# Patient Record
Sex: Female | Born: 1940 | Race: White | Hispanic: No | Smoking: Former smoker
Health system: Southern US, Community
[De-identification: ages and names within clinical notes are randomized; demographics above are authoritative.]

## PROBLEM LIST (undated history)

## (undated) DIAGNOSIS — Z9889 Other specified postprocedural states: Secondary | ICD-10-CM

## (undated) DIAGNOSIS — I10 Essential (primary) hypertension: Secondary | ICD-10-CM

## (undated) DIAGNOSIS — M810 Age-related osteoporosis without current pathological fracture: Secondary | ICD-10-CM

## (undated) DIAGNOSIS — S42009A Fracture of unspecified part of unspecified clavicle, initial encounter for closed fracture: Secondary | ICD-10-CM

## (undated) DIAGNOSIS — E785 Hyperlipidemia, unspecified: Secondary | ICD-10-CM

## (undated) DIAGNOSIS — R112 Nausea with vomiting, unspecified: Secondary | ICD-10-CM

## (undated) DIAGNOSIS — F32A Depression, unspecified: Secondary | ICD-10-CM

## (undated) DIAGNOSIS — K635 Polyp of colon: Secondary | ICD-10-CM

## (undated) DIAGNOSIS — E039 Hypothyroidism, unspecified: Secondary | ICD-10-CM

## (undated) DIAGNOSIS — F419 Anxiety disorder, unspecified: Secondary | ICD-10-CM

## (undated) DIAGNOSIS — E162 Hypoglycemia, unspecified: Secondary | ICD-10-CM

## (undated) DIAGNOSIS — Z8619 Personal history of other infectious and parasitic diseases: Secondary | ICD-10-CM

## (undated) HISTORY — DX: Age-related osteoporosis without current pathological fracture: M81.0

## (undated) HISTORY — DX: Polyp of colon: K63.5

## (undated) HISTORY — DX: Personal history of other infectious and parasitic diseases: Z86.19

## (undated) HISTORY — DX: Fracture of unspecified part of unspecified clavicle, initial encounter for closed fracture: S42.009A

## (undated) HISTORY — DX: Hypoglycemia, unspecified: E16.2

## (undated) HISTORY — DX: Hyperlipidemia, unspecified: E78.5

## (undated) HISTORY — PX: CLOSED REDUCTION FACIAL FRACTURE: SHX1359

---

## 1986-02-27 HISTORY — PX: ABDOMINAL HYSTERECTOMY: SHX81

## 1999-05-03 ENCOUNTER — Encounter: Payer: Self-pay | Admitting: Internal Medicine

## 1999-05-03 ENCOUNTER — Encounter: Admission: RE | Admit: 1999-05-03 | Discharge: 1999-05-03 | Payer: Self-pay | Admitting: Internal Medicine

## 1999-06-23 ENCOUNTER — Ambulatory Visit (HOSPITAL_COMMUNITY): Admission: RE | Admit: 1999-06-23 | Discharge: 1999-06-23 | Payer: Self-pay | Admitting: Gastroenterology

## 2000-06-05 ENCOUNTER — Encounter: Payer: Self-pay | Admitting: Internal Medicine

## 2000-06-05 ENCOUNTER — Encounter: Admission: RE | Admit: 2000-06-05 | Discharge: 2000-06-05 | Payer: Self-pay | Admitting: Internal Medicine

## 2001-06-26 ENCOUNTER — Encounter: Admission: RE | Admit: 2001-06-26 | Discharge: 2001-06-26 | Payer: Self-pay | Admitting: Internal Medicine

## 2001-06-26 ENCOUNTER — Encounter: Payer: Self-pay | Admitting: Internal Medicine

## 2002-02-27 HISTORY — PX: BREAST BIOPSY: SHX20

## 2002-07-09 ENCOUNTER — Encounter: Payer: Self-pay | Admitting: Internal Medicine

## 2002-07-09 ENCOUNTER — Encounter: Admission: RE | Admit: 2002-07-09 | Discharge: 2002-07-09 | Payer: Self-pay | Admitting: Internal Medicine

## 2002-07-14 ENCOUNTER — Encounter: Admission: RE | Admit: 2002-07-14 | Discharge: 2002-07-14 | Payer: Self-pay | Admitting: Internal Medicine

## 2002-07-14 ENCOUNTER — Encounter: Payer: Self-pay | Admitting: Internal Medicine

## 2002-07-14 ENCOUNTER — Encounter (INDEPENDENT_AMBULATORY_CARE_PROVIDER_SITE_OTHER): Payer: Self-pay | Admitting: Specialist

## 2002-12-31 ENCOUNTER — Encounter: Admission: RE | Admit: 2002-12-31 | Discharge: 2002-12-31 | Payer: Self-pay | Admitting: Internal Medicine

## 2003-06-17 ENCOUNTER — Ambulatory Visit (HOSPITAL_COMMUNITY): Admission: RE | Admit: 2003-06-17 | Discharge: 2003-06-18 | Payer: Self-pay | Admitting: Neurosurgery

## 2003-09-07 ENCOUNTER — Ambulatory Visit (HOSPITAL_BASED_OUTPATIENT_CLINIC_OR_DEPARTMENT_OTHER): Admission: RE | Admit: 2003-09-07 | Discharge: 2003-09-07 | Payer: Self-pay | Admitting: Orthopedic Surgery

## 2004-02-02 ENCOUNTER — Encounter: Admission: RE | Admit: 2004-02-02 | Discharge: 2004-02-02 | Payer: Self-pay | Admitting: Internal Medicine

## 2004-05-12 ENCOUNTER — Encounter: Admission: RE | Admit: 2004-05-12 | Discharge: 2004-05-12 | Payer: Self-pay | Admitting: Internal Medicine

## 2005-03-07 ENCOUNTER — Encounter: Admission: RE | Admit: 2005-03-07 | Discharge: 2005-03-07 | Payer: Self-pay | Admitting: Internal Medicine

## 2006-01-31 ENCOUNTER — Encounter (INDEPENDENT_AMBULATORY_CARE_PROVIDER_SITE_OTHER): Payer: Self-pay | Admitting: Specialist

## 2006-01-31 ENCOUNTER — Ambulatory Visit (HOSPITAL_COMMUNITY): Admission: RE | Admit: 2006-01-31 | Discharge: 2006-01-31 | Payer: Self-pay | Admitting: Gastroenterology

## 2006-03-08 ENCOUNTER — Encounter: Admission: RE | Admit: 2006-03-08 | Discharge: 2006-03-08 | Payer: Self-pay | Admitting: Internal Medicine

## 2007-03-10 DIAGNOSIS — Z803 Family history of malignant neoplasm of breast: Secondary | ICD-10-CM | POA: Insufficient documentation

## 2007-03-10 DIAGNOSIS — F341 Dysthymic disorder: Secondary | ICD-10-CM | POA: Insufficient documentation

## 2007-03-10 DIAGNOSIS — G4700 Insomnia, unspecified: Secondary | ICD-10-CM | POA: Insufficient documentation

## 2007-05-07 ENCOUNTER — Encounter: Admission: RE | Admit: 2007-05-07 | Discharge: 2007-05-07 | Payer: Self-pay | Admitting: Family Medicine

## 2007-05-07 DIAGNOSIS — L219 Seborrheic dermatitis, unspecified: Secondary | ICD-10-CM | POA: Insufficient documentation

## 2007-08-21 ENCOUNTER — Ambulatory Visit: Payer: Self-pay | Admitting: Family Medicine

## 2008-01-03 DIAGNOSIS — L578 Other skin changes due to chronic exposure to nonionizing radiation: Secondary | ICD-10-CM | POA: Insufficient documentation

## 2008-05-25 ENCOUNTER — Encounter: Admission: RE | Admit: 2008-05-25 | Discharge: 2008-05-25 | Payer: Self-pay | Admitting: Family Medicine

## 2008-05-29 ENCOUNTER — Encounter: Admission: RE | Admit: 2008-05-29 | Discharge: 2008-05-29 | Payer: Self-pay | Admitting: Family Medicine

## 2008-07-02 DIAGNOSIS — K5909 Other constipation: Secondary | ICD-10-CM | POA: Insufficient documentation

## 2008-07-02 DIAGNOSIS — K5900 Constipation, unspecified: Secondary | ICD-10-CM | POA: Insufficient documentation

## 2009-01-01 DIAGNOSIS — H35019 Changes in retinal vascular appearance, unspecified eye: Secondary | ICD-10-CM | POA: Insufficient documentation

## 2009-06-01 ENCOUNTER — Encounter: Admission: RE | Admit: 2009-06-01 | Discharge: 2009-06-01 | Payer: Self-pay

## 2009-09-22 DIAGNOSIS — M542 Cervicalgia: Secondary | ICD-10-CM | POA: Insufficient documentation

## 2010-03-20 ENCOUNTER — Encounter: Payer: Self-pay | Admitting: Family Medicine

## 2010-05-03 ENCOUNTER — Other Ambulatory Visit: Payer: Self-pay | Admitting: Family Medicine

## 2010-05-03 DIAGNOSIS — Z1231 Encounter for screening mammogram for malignant neoplasm of breast: Secondary | ICD-10-CM

## 2010-05-06 IMAGING — MG MM SCREEN MAMMOGRAM BILATERAL
4 series · 4 of 4 positions shown · non-contrast
Comparison: none

DG SCREEN MAMMOGRAM BILATERAL
Bilateral CC and MLO view(s) were taken.

DIGITAL SCREENING MAMMOGRAM WITH CAD:
There are scattered fibroglandular densities.  A possible mass is noted in the right breast.  Spot 
compression views and possibly sonography are recommended for further evaluation.  In the left 
breast, no masses or malignant type calcifications are identified.  Compared with prior studies.

[R CC]
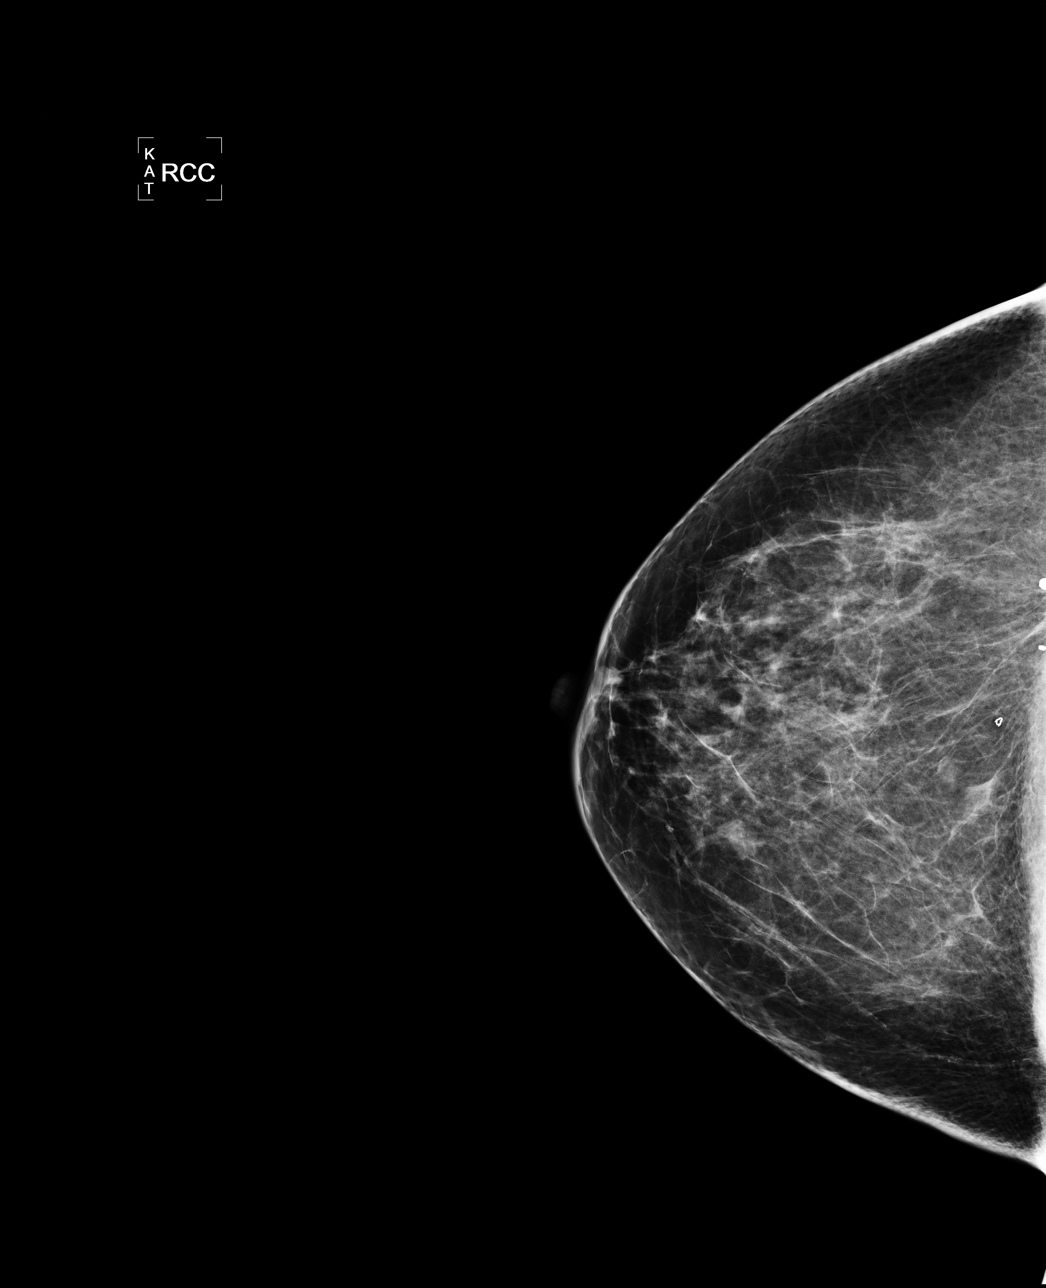

[L CC]
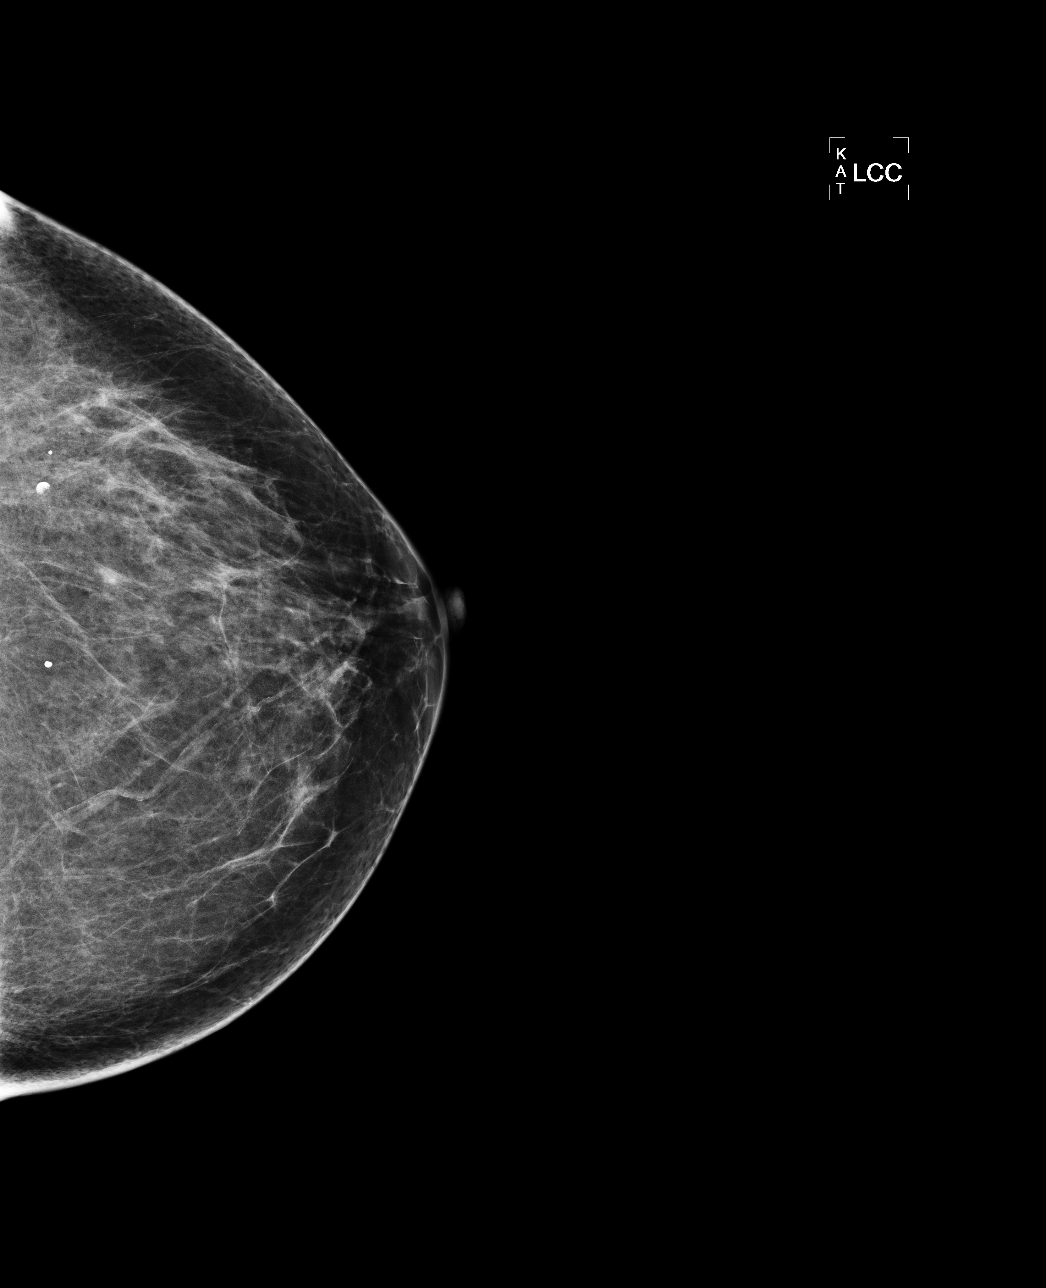

[L MLO]
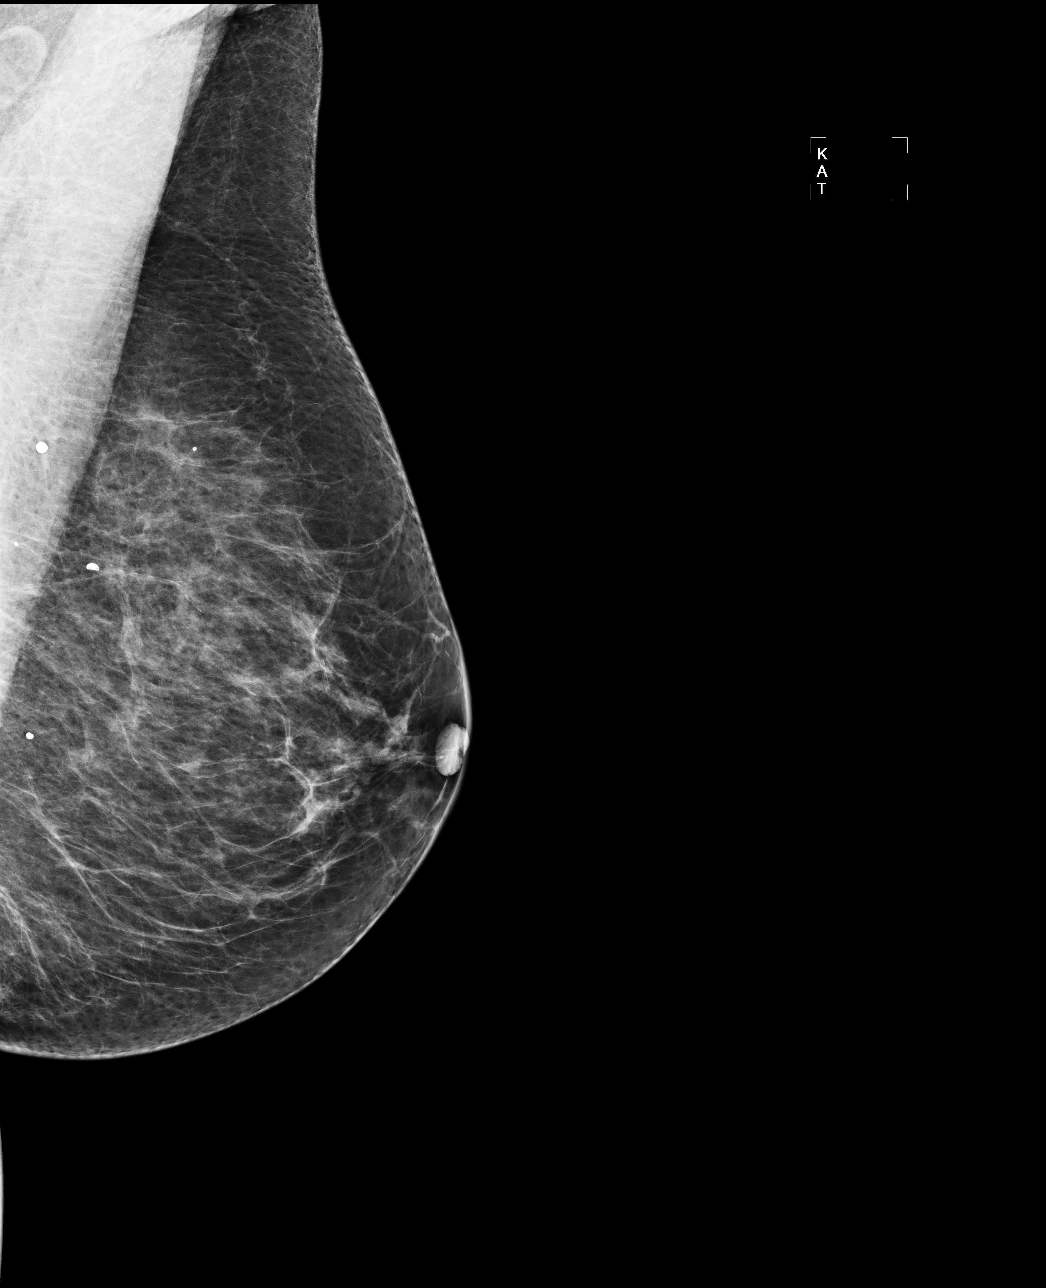

[R MLO]
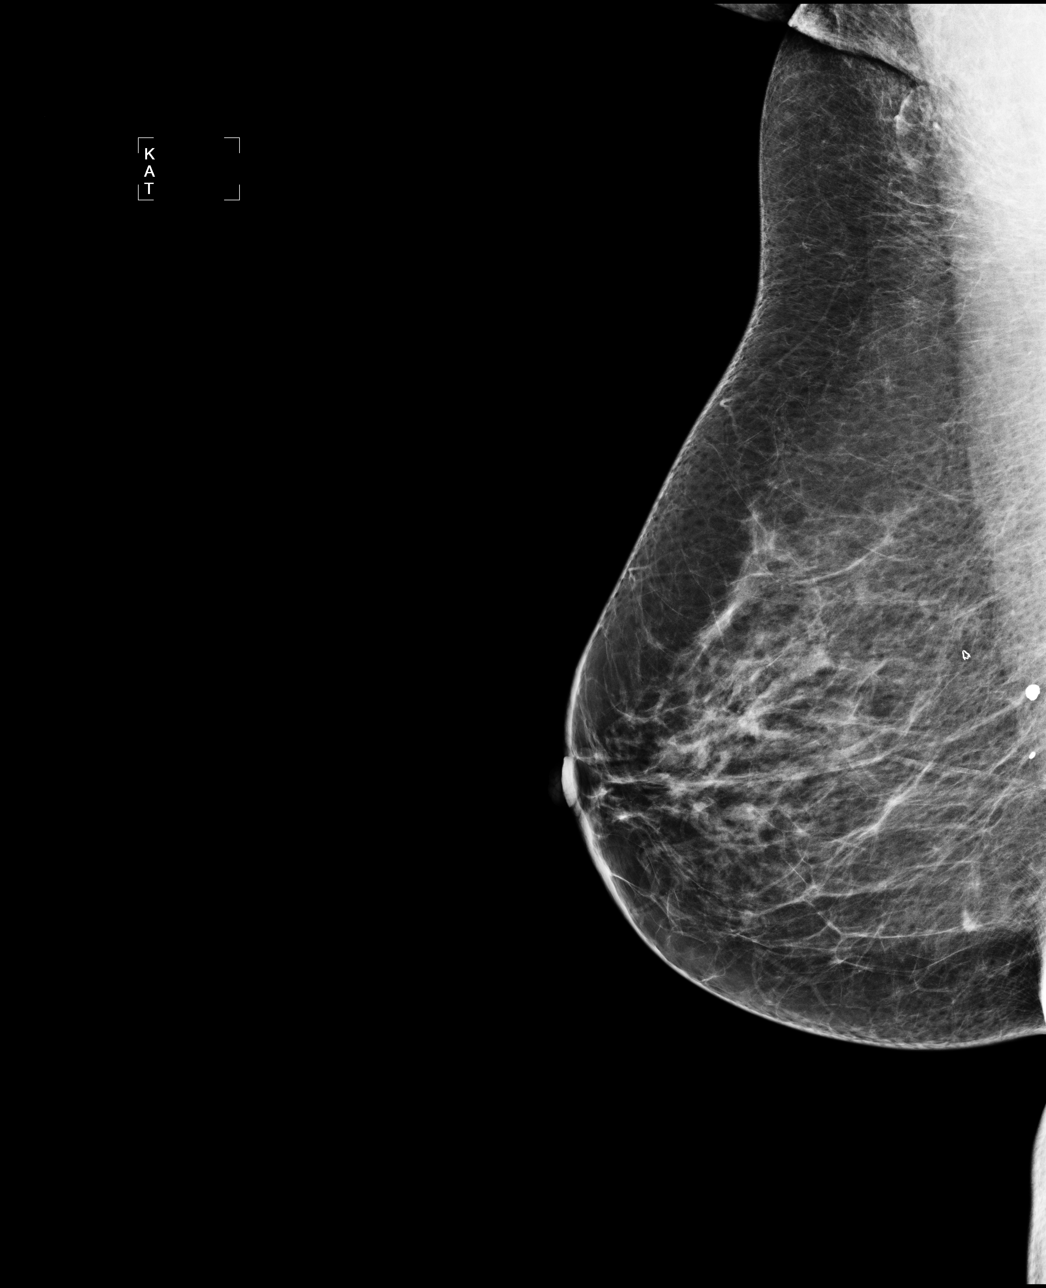

[4 of 4 positions shown; findings below may reference images not displayed]

IMPRESSION: Possible mass, right breast.  Additional evaluation is indicated.  The patient will be contacted 
for additional studies and a supplementary report will follow.  No specific mammographic evidence 
of malignancy, left breast.

ASSESSMENT: Need additional imaging evaluation and/or prior mammograms for comparison - BI-RADS 0

Further imaging of the right breast.
ANALYZED BY COMPUTER AIDED DETECTION. , THIS PROCEDURE WAS A DIGITAL MAMMOGRAM.

## 2010-06-07 ENCOUNTER — Ambulatory Visit
Admission: RE | Admit: 2010-06-07 | Discharge: 2010-06-07 | Disposition: A | Discharge status: Home / Self Care | Payer: Medicare Other | Source: Ambulatory Visit | Attending: Family Medicine | Admitting: Family Medicine

## 2010-06-07 DIAGNOSIS — Z1231 Encounter for screening mammogram for malignant neoplasm of breast: Secondary | ICD-10-CM

## 2010-07-15 NOTE — Op Note (Signed)
Laura Love, SWISS               ACCOUNT NO.:  [id number]   MEDICAL RECORD NO.:  [id number]          PATIENT TYPE:  AMB   LOCATION:  ENDO                         FACILITY:  [facility]   PHYSICIAN:  Anselmo Rod, M.D.  DATE OF BIRTH:  [birth year]   DATE OF PROCEDURE:  01/31/2006  DATE OF DISCHARGE:                               OPERATIVE REPORT   PROCEDURE PERFORMED:  Colonoscopy with multiple cold biopsies.   ENDOSCOPIST:  Anselmo Rod, M.D.   INSTRUMENT USED:  Olympus video colonoscope.   INDICATIONS FOR PROCEDURE:  A 70 year old white female undergoing a  screening colonoscopy to rule out colonic polyps, masses, etc.   PREPROCEDURE PREPARATION:  Informed consent was procured from the  patient.  The patient fasted for 8 hours prior to the procedure and  prepped with a gallon of Trilyte the night prior to the procedure.  The  risks and benefits of the procedure including a 10% miss rate of cancer  and polyp were discussed with the patient as well.   PREPROCEDURE PHYSICAL:  The patient had stable vital signs.  Neck  supple.  Chest clear to auscultation.  S1 and S2 regular.  Abdomen soft  with normal bowel sounds.   DESCRIPTION OF PROCEDURE:  The patient was placed in the left lateral  decubitus position and sedated with 75 mcg of fentanyl and 7.5 mg of  Versed given intravenously in slow incremental doses.  Once the patient  was adequately sedated and maintained on low-flow oxygen and continuous  cardiac monitoring the Olympus video colonoscope was advanced from the  rectum to cecum.  There was a significant amount of residual stool in  the colon.  Multiple washes were done.  A prominent fold was biopsied  from the rectum.  Small internal hemorrhoids were seen.  There was  evidence of sigmoid diverticulosis.  A small sessile polyp was biopsied  from the rectosigmoid colon x3 (cold biopsies x3).  This was a flat  polyp.  Two polyps were biopsied from the left colon.  The  transverse  colon, right colon and cecum appeared normal and the terminal ileum  showed no evidence of disease.  The patient tolerated the procedure well  without immediate complications.   IMPRESSION:  1. Small internal hemorrhoids.  2. Prominent fold biopsied from the rectum.  3. Sigmoid diverticulosis.  4. One flat polyp biopsied from the rectosigmoid colon, two from the      left colon.  5. Normal transverse colon, right colon, and cecum.  6. Normal terminal ileum.   RECOMMENDATIONS:  1. Await pathology results.  2. Avoid all nonsteroidals including aspirin for now especially over      the next 2 weeks.  3. Repeat colonoscopy depending on pathology results.  4. High fiber diet with liberal fluid intake has been advocated and      brochures on diverticulosis has been given to the patient for      education.      Anselmo Rod, M.D.  Electronically Signed     JNM/MEDQ  D:  01/31/2006  T:  01/31/2006  Job:  [id number] 

## 2010-07-15 NOTE — Op Note (Signed)
NAME:  Laura Love, Laura Love                         ACCOUNT NO.:  [id number]   MEDICAL RECORD NO.:  [id number]                   PATIENT TYPE:  AMB   LOCATION:  DSC                                  FACILITY:  [facility]   PHYSICIAN:  John L. Rendall III, M.D.           DATE OF BIRTH:  10/05/1940   DATE OF PROCEDURE:  09/07/2003  DATE OF DISCHARGE:                                 OPERATIVE REPORT   PREOPERATIVE DIAGNOSIS:  Complex tear of lateral meniscus, right knee.   PROCEDURE:  Lateral meniscectomy and local debridement plus chondroplasty of  medial femoral condyle.   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   SURGEON:  John L. Rendall, M.D.   ANESTHESIA:  MAC.   DESCRIPTION OF PROCEDURE:  Under MAC anesthesia the right knee was prepared  with Betadine and draped as a sterile field with a leg holder.  Standard  arthroscopic portals were made and peeling the hyaline cartilage near the  intra-condylar notch of the medial femoral condyle is found.  Using a curved  Merlin shaver and a rasp, this area was significantly improved with a  chondroplasty.  The medial meniscus is intact. Attention is then turned to  the lateral compartment where a complex tear of the lateral meniscus is  found.  There is bare bone in the lateral tibial plateau and on the adjacent  lateral femur and areas of grade III changes on a goodly portion of the  central weight bearing of the medial femoral condyle and tibia.  Using a  combination of basket forceps and intraarticular shaver, the meniscus is  resected back to stable meniscal rim and the peeling cartilage is trimmed  and smoothed with the shaver. All debris is washed out of the knee with the  shaver and irrigation system.  The remainder of the knee is carefully  examined.  Debris is removed from the recesses and suprapatellar pouch.  Following this, the knee is infiltrated with Marcaine with  morphine with  epinephrine and a sterile compression bandage is applied.  The patient  returned to recovery in good condition.                                               John L. Dorothyann Gibbs, M.D.    Renato Gails  D:  09/07/2003  T:  09/07/2003  Job:  [id number]

## 2010-07-15 NOTE — Op Note (Signed)
NAME:  Laura Love, Laura Love                         ACCOUNT NO.:  [id number]   MEDICAL RECORD NO.:  [id number]                   PATIENT TYPE:  OIB   LOCATION:  2899                                 FACILITY:  [facility]   PHYSICIAN:  Hewitt Shorts, M.D.            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   OPERATION PERFORMED:  Left L4-5 lumbar laminotomy and foraminotomy with  microdissection.   SURGEON:  Hewitt Shorts, M.D.   ASSISTANT:  Cristi Loron, M.D.   ANESTHESIA:  General endotracheal.   INDICATIONS FOR PROCEDURE:  The patient is a 70 year old woman who presented  with a left lumbar radiculopathy and was found to have significant  spondylosis and degenerative disk disease with significant left L4-5 lateral  recess stenosis.  Decision was made to proceed with elective laminotomy and  foraminotomy.   DESCRIPTION OF PROCEDURE:  The patient was brought to the operating room and  placed under general endotracheal anesthesia.  The patient was turned to a  prone position.  The lumbar region was prepped with DuraPrep and draped in  sterile fashion.  The midline incision was infiltrated with local  anesthetic with epinephrine and x-ray was taken, the L4-5 level identified.  Then a midline incision made and carried down to the subcutaneous tissue  with bipolar cautery and electrocautery used to maintain hemostasis.  Dissection was carried down to the lumbar fascia which was incised on the  left side of midline and the paraspinal muscles were dissected from the  spinous process and lamina in subperiosteal  fashion.  Self-retaining  retractor was placed, another x-ray was taken. The L4-5 interlaminar space  was identified and then The operating microscope was draped and brought into  the field to provide additional magnification, illumination and  visualization and the remainder of the decompression was performed using  microdissection and microsurgical technique.  A laminotomy was performed  using the [facility] Max drill and Kerrison punches.  The ligamentum flavum was  carefully removed.  It was noted to be quite thickened and as it was  removed, we were able to decompress the thecal sac and nerve root.  Using an  up angled curet we were able to scrape away some of the more lateral aspect  of the ligamentum flavum from the undersurface of the facet and in the end,  good decompression of the left lateral recess and foramina was achieved.  We  did palpate the disk which was degenerated but not herniated and it was felt  that no further decompression would be achieved by proceeding with the  diskectomy. We  therefore established hemostasis with the use of Gelfoam  soaked in Thrombin.  All the Gelfoam was removed, though, prior to closure.  We instilled 2 mL of fentanyl and 80 mg of Depo-Medrol in the epidural space  and proceeded with closure.  The deep fascia closed with interrupted #1  undyed Vicryl sutures, the subcutaneous and subcuticular layer were closed  with interrupted inverted 2-0 undyed Vicryl sutures, skin edges  were approximated with Dermabond.  The patient tolerated the procedure well.  The estimated blood loss was 25 mL.  Sponge, needle and instrument counts  were correct.  Following surgery, the patient was turned back to supine  position reversed from anesthetic, extubated and transferred to recovery  room for further care.                                               Hewitt Shorts, M.D.    RWN/MEDQ  D:  06/17/2003  T:  06/18/2003  Job:  [id number]

## 2010-07-15 NOTE — Procedures (Signed)
[facility]  Patient:    Laura Love, Laura Love                      MRN: [id number] Proc. Date: 06/23/99 Adm. Date:  [id number] Attending:  Charna Elizabeth CC:         Lyndon Code, M.D.                           Procedure Report  DATE OF BIRTH:  [birth year].  REFERRING PHYSICIAN:  Lyndon Code, M.D.  PROCEDURE PERFORMED:  Colonoscopy.  ENDOSCOPIST:  Anselmo Rod, M.D.  INSTRUMENT USED:  Olympus video colonoscope.  INDICATIONS FOR PROCEDURE:  The patient is a 70 year old white female with a history of colonic polyps and recent rectal bleeding rule out recurrent polyps, masses, hemorrhoids, masses, etc.  PREPROCEDURE PREPARATION:  Informed consent was procured from the patient. The patient was fasted for eight hours prior to the procedure and prepped with a bottle of magnesium citrate and a gallon of NuLytely the night prior to the procedure.  PREPROCEDURE PHYSICAL:  The patient had stable vital signs.  Neck supple. Chest clear to auscultation.  S1, S2 regular.  Abdomen soft with normal abdominal bowel sounds.  DESCRIPTION OF PROCEDURE:  The patient was placed in the left lateral decubitus position and sedated with 50 mg of Demerol and 3 mg of Versed intravenously.  Once the patient was adequately sedated and maintained on low-flow oxygen and continuous cardiac monitoring, the Olympus video colonoscope was advanced from the rectum to the cecum without difficulty. There were a few left-sided diverticula seen.  A small of residual stool in the dependent areas of the colon.  No masses, polyps, erosions or ulcerations were seen.  The procedure was complete up to the cecum.  On withdrawing the scope and retroflexing in the cecum, moderate-sized internal hemorrhoids were seen.  No other abnormalities were present.  The patient tolerated the procedure well without complications.  IMPRESSION: 1. Moderate sized internal hemorrhoids. 2. Left-sided  diverticulosis. 3. No masses or polyps seen.  Procedure completed up to the cecum.  RECOMMENDATIONS: 1. The patient has been advised to follow up in the office in the next two    weeks. 2. A high fiber diet with liberal fluid intake has been advocated.  DD:  06/23/99 TD:  06/23/99 Job: [id number]

## 2010-10-19 ENCOUNTER — Ambulatory Visit: Payer: Self-pay | Admitting: Internal Medicine

## 2010-10-19 ENCOUNTER — Encounter: Payer: Self-pay | Admitting: Internal Medicine

## 2010-10-19 ENCOUNTER — Ambulatory Visit (INDEPENDENT_AMBULATORY_CARE_PROVIDER_SITE_OTHER): Payer: Medicare Other | Admitting: Internal Medicine

## 2010-10-19 DIAGNOSIS — N8110 Cystocele, unspecified: Secondary | ICD-10-CM

## 2010-10-19 DIAGNOSIS — R05 Cough: Secondary | ICD-10-CM

## 2010-10-19 DIAGNOSIS — R32 Unspecified urinary incontinence: Secondary | ICD-10-CM

## 2010-10-19 DIAGNOSIS — E7800 Pure hypercholesterolemia, unspecified: Secondary | ICD-10-CM | POA: Insufficient documentation

## 2010-10-19 DIAGNOSIS — R059 Cough, unspecified: Secondary | ICD-10-CM | POA: Insufficient documentation

## 2010-10-19 DIAGNOSIS — E785 Hyperlipidemia, unspecified: Secondary | ICD-10-CM | POA: Insufficient documentation

## 2010-10-19 DIAGNOSIS — Z0000 Encounter for general adult medical examination without abnormal findings: Secondary | ICD-10-CM

## 2010-10-19 NOTE — Patient Instructions (Signed)
Follow up for physical in 1 month. Labs next week, fasting at least 6hours beforehand.  CXR today.

## 2010-10-19 NOTE — Progress Notes (Signed)
Subjective:    Patient ID: Laura Love, female    DOB: 08/25/1940, 70 y.o.   MRN: [id number]  HPI  Laura Love is a 70 year old female who presents to establish care. She reports a history of hyperlipidemia. She reports that she has not recently had her lipid profile checked. She denies any personal history of heart disease or stroke. She has been taking pravastatin for several years and denies any complications from this medication.  Laura Love also reports a 1-2 month history of cough. She describes the cough as dry and nonproductive. She reports the cough initially occurred when she stayed in a hotel room which she described as moldy or musty. She has not been treated for cough. She denies any fever or chills. She denies weight loss. She denies any history of smoking.  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 She has recently been seen by a gynecologist. She was fitted for a pessary. She reports this pessary has significantly improved her quality of life. She is also using both a topical estrogen preparation and an estrogen patch. She reports she is scheduled for evaluation at [facility] with gynecology for possible surgical intervention to treat her vaginal prolapse.  Outpatient Encounter Prescriptions as of 10/19/2010  Medication Sig Dispense Refill  . ergocalciferol (VITAMIN D2) 50000 UNITS capsule Take 50,000 Units by mouth once a week.        . estradiol (VIVELLE-DOT) 0.1 MG/24HR Place 1 patch onto the skin 2 (two) times a week.        Marland Kitchen buPROPion (WELLBUTRIN SR) 150 MG 12 hr tablet       . cephALEXin (KEFLEX) 250 MG capsule       . ESTRING 2 MG vaginal ring       . fenofibrate 160 MG tablet       . pravastatin (PRAVACHOL) 40 MG tablet          . Review of Systems  Constitutional: Negative for fever, chills, diaphoresis, activity change, appetite change, fatigue and unexpected weight change.  HENT: Negative for hearing loss,  ear pain, nosebleeds, congestion, sore throat, facial swelling, rhinorrhea, sneezing, drooling, mouth sores, trouble swallowing, neck pain, neck stiffness, dental problem, voice change, postnasal drip, sinus pressure, tinnitus and ear discharge.   Eyes: Negative for photophobia, pain, discharge, redness, itching and visual disturbance.  Respiratory: Positive for cough. Negative for apnea, choking, chest tightness, shortness of breath, wheezing and stridor.   Cardiovascular: Negative for chest pain, palpitations and leg swelling.  Gastrointestinal: Negative for nausea, vomiting, abdominal pain, diarrhea, constipation, blood in stool, abdominal distention, anal bleeding and rectal pain.  Genitourinary: Positive for frequency and difficulty urinating (without pessary in place, must apply pressure to pelvic floor). Negative for dysuria, urgency, hematuria, flank pain, decreased urine volume, vaginal bleeding, vaginal discharge, enuresis, vaginal pain, menstrual problem, pelvic pain and dyspareunia.  Musculoskeletal: Negative for myalgias, back pain, joint swelling, arthralgias and gait problem.  Skin: Negative for color change, rash and wound.  Neurological: Negative for dizziness, tremors, seizures, syncope, facial asymmetry, speech difficulty, weakness, light-headedness, numbness and headaches.  Hematological: Negative for adenopathy. Does not bruise/bleed easily.  Psychiatric/Behavioral: Negative for suicidal ideas, hallucinations, sleep disturbance, dysphoric mood and decreased concentration. The patient is not nervous/anxious.    BP 138/76  Pulse 74  Temp(Src) 98.5 F (36.9 C) (Oral)  Resp 16  Ht 5\' 3"  (1.6 m)  Wt 148 lb (67.132 kg)  BMI 26.22 kg/m2  SpO2  95%     Objective:   Physical Exam  Constitutional: She is oriented to person, place, and time. She appears well-developed and well-nourished. No distress.  HENT:  Head: Normocephalic and atraumatic.  Right Ear: External ear normal.    Left Ear: External ear normal.  Nose: Nose normal.  Mouth/Throat: Oropharynx is clear and moist. No oropharyngeal exudate.  Eyes: Conjunctivae are normal. Pupils are equal, round, and reactive to light. Right eye exhibits no discharge. Left eye exhibits no discharge. No scleral icterus.  Neck: Normal range of motion. Neck supple. No tracheal deviation present. No thyromegaly present.  Cardiovascular: Normal rate, regular rhythm, normal heart sounds and intact distal pulses.  Exam reveals no gallop and no friction rub.   No murmur heard. Pulmonary/Chest: Effort normal and breath sounds normal. No respiratory distress. She has no wheezes. She has no rales. She exhibits no tenderness.  Musculoskeletal: Normal range of motion. She exhibits no edema and no tenderness.  Lymphadenopathy:    She has no cervical adenopathy.  Neurological: She is alert and oriented to person, place, and time. No cranial nerve deficit. She exhibits normal muscle tone. Coordination normal.  Skin: Skin is warm and dry. No rash noted. She is not diaphoretic. No erythema. No pallor.  Psychiatric: She has a normal mood and affect. Her behavior is normal. Judgment and thought content normal.          Assessment & Plan:  1. Hyperlipidemia - patient with long-standing history of hyperlipidemia. Will check lipid profile and liver function test with lab work today and will continue pravastatin. She will followup to review lab work in one to 2 weeks.  2. Cough - Patient with 1-2 month history of nonproductive cough.Non-smoker.  Exam is normal today. Suspect allergic rhinitis with postnasal drip. However, given persistence of cough will get chest x-ray. She will followup in one to 2 weeks. May consider adding flonase and/or Singulair at that time.  3. Bladder Incontinence /Vaginal Prolapse - Will request records from gynecologists office. Of note patient is using estrogen cream, ring, and estrogen patch. She does have a very  strong family history of breast cancer. She understands the risk associated with use of estrogen preparation, including potential increased risk of breast cancer. At present, she reports significant improvement in quality of life with use of topical estrogen preparations and benefit outweighs risk. She reports being up-to-date on her mammograms. We will request a record of her recent mammogram.

## 2010-10-24 ENCOUNTER — Other Ambulatory Visit (INDEPENDENT_AMBULATORY_CARE_PROVIDER_SITE_OTHER): Payer: Medicare Other | Admitting: *Deleted

## 2010-10-24 DIAGNOSIS — E785 Hyperlipidemia, unspecified: Secondary | ICD-10-CM

## 2010-10-24 DIAGNOSIS — Z0000 Encounter for general adult medical examination without abnormal findings: Secondary | ICD-10-CM

## 2010-10-24 LAB — BASIC METABOLIC PANEL
CO2: 28 mEq/L (ref 19–32)
Calcium: 9.2 mg/dL (ref 8.4–10.5)
Chloride: 102 mEq/L (ref 96–112)
Creatinine, Ser: 0.9 mg/dL (ref 0.4–1.2)
Glucose, Bld: 89 mg/dL (ref 70–99)
Sodium: 138 mEq/L (ref 135–145)

## 2010-10-24 LAB — CBC WITH DIFFERENTIAL/PLATELET
Basophils Absolute: 0.1 10*3/uL (ref 0.0–0.1)
Eosinophils Absolute: 0.2 10*3/uL (ref 0.0–0.7)
HCT: 39.5 % (ref 36.0–46.0)
Hemoglobin: 13.3 g/dL (ref 12.0–15.0)
MCHC: 33.8 g/dL (ref 30.0–36.0)
MCV: 97.3 fl (ref 78.0–100.0)
Monocytes Absolute: 0.5 10*3/uL (ref 0.1–1.0)
RBC: 4.06 Mil/uL (ref 3.87–5.11)
RDW: 13.1 % (ref 11.5–14.6)
WBC: 4.5 10*3/uL (ref 4.5–10.5)

## 2010-10-24 LAB — HEPATIC FUNCTION PANEL
ALT: 21 U/L (ref 0–35)
Albumin: 4.1 g/dL (ref 3.5–5.2)
Alkaline Phosphatase: 54 U/L (ref 39–117)

## 2010-10-24 LAB — LIPID PANEL
Cholesterol: 229 mg/dL — ABNORMAL HIGH (ref 0–200)
HDL: 63.7 mg/dL (ref >39.00)

## 2010-10-24 LAB — LDL CHOLESTEROL, DIRECT: Direct LDL: 159.3 mg/dL

## 2010-11-08 ENCOUNTER — Encounter: Payer: Self-pay | Admitting: Internal Medicine

## 2010-11-08 ENCOUNTER — Ambulatory Visit (INDEPENDENT_AMBULATORY_CARE_PROVIDER_SITE_OTHER): Payer: Medicare Other | Admitting: Internal Medicine

## 2010-11-08 VITALS — BP 120/78 | HR 72 | Temp 97.7°F | Resp 14 | Ht 63.0 in | Wt 150.0 lb

## 2010-11-08 DIAGNOSIS — Z0000 Encounter for general adult medical examination without abnormal findings: Secondary | ICD-10-CM

## 2010-11-08 DIAGNOSIS — E039 Hypothyroidism, unspecified: Secondary | ICD-10-CM | POA: Insufficient documentation

## 2010-11-08 MED ORDER — LEVOTHYROXINE SODIUM 50 MCG PO TABS: 50.0000 ug | ORAL_TABLET | Freq: Every day | ORAL | Status: DC

## 2010-11-08 MED ORDER — ESTRADIOL 0.1 MG/24HR TD PTTW: 1.0000 | MEDICATED_PATCH | TRANSDERMAL | Status: DC

## 2010-11-08 NOTE — Patient Instructions (Signed)
Start Synthroid. Follow up in 6 weeks for labs and in 8 weeks for visit.

## 2010-11-08 NOTE — Progress Notes (Signed)
Laura Love is a 70 y.o. female who presents for Medicare Annual/Subsequent preventive examination.  Preventive Screening-Counseling & Management  Tobacco History  Smoking status  . Never Smoker   Smokeless tobacco  . Never Used     Problems Prior to Visit 1. Vaginal Prolapse 2. Hyperlipidemia 3. Bladder Incontinence  Current Problems (verified) Patient Active Problem List  Diagnoses  . Hyperlipemia  . Bladder incontinence  . Vaginal prolapse    Medications Prior to Visit Current Outpatient Prescriptions on File Prior to Visit  Medication Sig Dispense Refill  . buPROPion (WELLBUTRIN SR) 150 MG 12 hr tablet       . ergocalciferol (VITAMIN D2) 50000 UNITS capsule Take 50,000 Units by mouth once a week.        . estradiol (VIVELLE-DOT) 0.1 MG/24HR Place 1 patch onto the skin 2 (two) times a week.        Marland Kitchen ESTRING 2 MG vaginal ring       . pravastatin (PRAVACHOL) 40 MG tablet         Current Medications (verified) Current Outpatient Prescriptions  Medication Sig Dispense Refill  . buPROPion (WELLBUTRIN SR) 150 MG 12 hr tablet       . ergocalciferol (VITAMIN D2) 50000 UNITS capsule Take 50,000 Units by mouth once a week.        . estradiol (VIVELLE-DOT) 0.1 MG/24HR Place 1 patch onto the skin 2 (two) times a week.        Marland Kitchen ESTRING 2 MG vaginal ring       . pravastatin (PRAVACHOL) 40 MG tablet       . levothyroxine (SYNTHROID) 50 MCG tablet Take 1 tablet (50 mcg total) by mouth daily.  30 tablet  11     Allergies (verified) Review of patient's allergies indicates no known allergies.   PAST HISTORY  Family History Family History  Problem Relation Age of Onset  . Cancer Mother     breast  . Hyperlipidemia Mother   . Cancer Sister     breast cancer    Social History History  Substance Use Topics  . Smoking status: Never Smoker   . Smokeless tobacco: Never Used  . Alcohol Use: No     Are there smokers in your home (other than you)? No  Risk  Factors Current exercise habits: The patient does not participate in regular exercise at present. Pt limited currently by caring for husband. Dietary issues discussed: Healthy,no fried foods. Limited sweets.   Cardiac risk factors: advanced age (older than 36 for men, 70 for women), dyslipidemia, family history of premature cardiovascular disease and sedentary lifestyle.  Depression Screen (Note: if answer to either of the following is "Yes", a more complete depression screening is indicated)   Over the past two weeks, have you felt down, depressed or hopeless? No  Over the past two weeks, have you felt little interest or pleasure in doing things? No  Have you lost interest or pleasure in daily life? No  Do you often feel hopeless? No  Do you cry easily over simple problems? No  Activities of Daily Living In your present state of health, do you have any difficulty performing the following activities?:  Driving? No Managing money?  No Feeding yourself? No Getting from bed to chair? No Climbing a flight of stairs? No Preparing food and eating?: No Bathing or showering? No Getting dressed: No Getting to the toilet? No Using the toilet:No Moving around from place to place: No In the  past year have you fallen or had a near fall?:No   Are you sexually active?  No  Do you have more than one partner?  No  Hearing Difficulties: No Do you often ask people to speak up or repeat themselves? No Do you experience ringing or noises in your ears? No Do you have difficulty understanding soft or whispered voices? No   Do you feel that you have a problem with memory? No  Do you often misplace items? No  Do you feel safe at home?  Yes  Cognitive Testing  Alert? Yes  Normal Appearance?Yes  Oriented to person? Yes  Place? Yes   Time? Yes  Recall of three objects?  Yes  Can perform simple calculations? Yes  Displays appropriate judgment?Yes   Advanced Directives have been discussed with the  patient? No  List the Names of Other Physician/Practitioners you currently use: 1.    Indicate any recent Medical Services you may have received from other than [facility] providers in the past year (date may be approximate).   There is no immunization history on file for this patient.  Screening Tests Health Maintenance  Topic Date Due  . Tetanus/tdap  Up to date  . Zostavax  2009 complete  . Pneumococcal Polysaccharide Vaccine Age 34 And Over  Up to date  . Influenza Vaccine  11/28/2010  . Colonoscopy  Unknown    All answers were reviewed with the patient and necessary referrals were made:  Shelia Media, MD   11/08/2010   History reviewed: allergies, current medications, past family history, past medical history, past social history, past surgical history and problem list  Review of Systems A comprehensive review of systems was negative except for: fatigue, dry skin, intolerance to temperature, constipation    Objective:    Body mass index is 26.57 kg/(m^2). BP 120/78  Pulse 72  Temp(Src) 97.7 F (36.5 C) (Oral)  Resp 14  Ht 5\' 3"  (1.6 m)  Wt 150 lb (68.04 kg)  BMI 26.57 kg/m2  SpO2 97%  General appearance: alert, cooperative, appears stated age and no distress Head: Normocephalic, without obvious abnormality, atraumatic Eyes: conjunctivae/corneas clear. PERRL, EOM's intact. Fundi benign. Ears: normal TM's and external ear canals both ears Nose: Nares normal. Septum midline. Mucosa normal. No drainage or sinus tenderness. Throat: lips, mucosa, and tongue normal; teeth and gums normal Neck: no adenopathy, no carotid bruit, no JVD, supple, symmetrical, trachea midline and thyroid not enlarged, symmetric, no tenderness/mass/nodules Back: symmetric, no curvature. ROM normal. No CVA tenderness. Lungs: clear to auscultation bilaterally Breasts: normal appearance, no masses or tenderness, No nipple retraction or dimpling, No nipple discharge or bleeding, No axillary or  supraclavicular adenopathy Heart: regular rate and rhythm, S1, S2 normal, no murmur, click, rub or gallop Abdomen: soft, non-tender; bowel sounds normal; no masses,  no organomegaly Extremities: extremities normal, atraumatic, no cyanosis or edema Pulses: 2+ and symmetric Skin: Skin color, texture, turgor normal. No rashes or lesions Lymph nodes: Cervical, supraclavicular, and axillary nodes normal. Neurologic: Grossly normal     Assessment:     1. General well exam - Up to date on health maintenance. Requested records from previous physician. 2. Hypothyroidism      Plan:     1. During the course of the visit the patient was educated and counseled about appropriate screening and preventive services including:    Pneumococcal vaccine (up to date)  Td vaccine (up to date)  Screening mammography (up to date)  Colorectal cancer screening (need  records to confirm up to date)  2. Hypothyroidism - Will start Synthroid daily. Pt will have TSH rechecked in 6 weeks with follow up in 8 weeks.  Patient Instructions (the written plan) was given to the patient.  Medicare Attestation I have personally reviewed: The patient's medical and social history Their use of alcohol, tobacco or illicit drugs Their current medications and supplements The patient's functional ability including ADLs,fall risks, home safety risks, cognitive, and hearing and visual impairment Diet and physical activities Evidence for depression or mood disorders  The patient's weight, height, BMI, and visual acuity have been recorded in the chart.  I have made referrals, counseling, and provided education to the patient based on review of the above and I have provided the patient with a written personalized care plan for preventive services.     Shelia Media, MD   11/08/2010

## 2010-11-08 NOTE — Progress Notes (Deleted)
Subjective:    Patient ID: Laura Love, female    DOB: 11-18-1940, 70 y.o.   MRN: [id number]  HPI    Review of Systems     Objective:   Physical Exam        Assessment & Plan:   Subjective:    Laura Love is a 70 y.o. female who presents for Medicare Annual/Subsequent preventive examination.  Preventive Screening-Counseling & Management  Tobacco History  Smoking status  . Never Smoker   Smokeless tobacco  . Never Used     Problems Prior to Visit 1.   Current Problems (verified) Patient Active Problem List  Diagnoses  . Hyperlipemia  . Bladder incontinence  . Vaginal prolapse    Medications Prior to Visit Current Outpatient Prescriptions on File Prior to Visit  Medication Sig Dispense Refill  . buPROPion (WELLBUTRIN SR) 150 MG 12 hr tablet       . ergocalciferol (VITAMIN D2) 50000 UNITS capsule Take 50,000 Units by mouth once a week.        . estradiol (VIVELLE-DOT) 0.1 MG/24HR Place 1 patch onto the skin 2 (two) times a week.        Marland Kitchen ESTRING 2 MG vaginal ring       . pravastatin (PRAVACHOL) 40 MG tablet         Current Medications (verified) Current Outpatient Prescriptions  Medication Sig Dispense Refill  . buPROPion (WELLBUTRIN SR) 150 MG 12 hr tablet       . ergocalciferol (VITAMIN D2) 50000 UNITS capsule Take 50,000 Units by mouth once a week.        . estradiol (VIVELLE-DOT) 0.1 MG/24HR Place 1 patch onto the skin 2 (two) times a week.        Marland Kitchen ESTRING 2 MG vaginal ring       . pravastatin (PRAVACHOL) 40 MG tablet       . levothyroxine (SYNTHROID) 50 MCG tablet Take 1 tablet (50 mcg total) by mouth daily.  30 tablet  11     Allergies (verified) Review of patient's allergies indicates no known allergies.   PAST HISTORY  Family History Family History  Problem Relation Age of Onset  . Cancer Mother     breast  . Hyperlipidemia Mother   . Cancer Sister     breast cancer    Social History History  Substance Use Topics  .  Smoking status: Never Smoker   . Smokeless tobacco: Never Used  . Alcohol Use: No     Are there smokers in your home (other than you)? No  Risk Factors Current exercise habits: The patient does not participate in regular exercise at present. Pt limited currently by caring for husband. Dietary issues discussed: Healthy,no fried foods. Limited sweets.   Cardiac risk factors: advanced age (older than 26 for men, 58 for women), dyslipidemia, family history of premature cardiovascular disease and sedentary lifestyle.  Depression Screen (Note: if answer to either of the following is "Yes", a more complete depression screening is indicated)   Over the past two weeks, have you felt down, depressed or hopeless? No  Over the past two weeks, have you felt little interest or pleasure in doing things? No  Have you lost interest or pleasure in daily life? No  Do you often feel hopeless? No  Do you cry easily over simple problems? No  Activities of Daily Living In your present state of health, do you have any difficulty performing the following activities?:  Driving? No  Managing money?  No Feeding yourself? No Getting from bed to chair? No Climbing a flight of stairs? No Preparing food and eating?: No Bathing or showering? No Getting dressed: No Getting to the toilet? No Using the toilet:No Moving around from place to place: No In the past year have you fallen or had a near fall?:No   Are you sexually active?  No  Do you have more than one partner?  No  Hearing Difficulties: No Do you often ask people to speak up or repeat themselves? No Do you experience ringing or noises in your ears? No Do you have difficulty understanding soft or whispered voices? No   Do you feel that you have a problem with memory? No  Do you often misplace items? No  Do you feel safe at home?  Yes  Cognitive Testing  Alert? {yes/no:20286}  Normal Appearance?{yes/no:20286}  Oriented to person? {yes/no:20286}   Place? {yes/no:20286}   Time? {yes/no:20286}  Recall of three objects?  {yes/no:20286}  Can perform simple calculations? {yes/no:20286}  Displays appropriate judgment?{yes/no:20286}  Can read the correct time from a watch face?{yes/no:20286}   Advanced Directives have been discussed with the patient? {yes/no:20286}  List the Names of Other Physician/Practitioners you currently use: 1.    Indicate any recent Medical Services you may have received from other than [facility] providers in the past year (date may be approximate).   There is no immunization history on file for this patient.  Screening Tests Health Maintenance  Topic Date Due  . Tetanus/tdap  Up to date  . Zostavax  2009 complete  . Pneumococcal Polysaccharide Vaccine Age 21 And Over  Up to date  . Influenza Vaccine  11/28/2010  . Colonoscopy  Unknown    All answers were reviewed with the patient and necessary referrals were made:  Shelia Media, MD   11/08/2010   History reviewed: {history reviewed:20406::"allergies","current medications","past family history","past medical history","past social history","past surgical history","problem list"}  Review of Systems {ros; complete:30496}    Objective:     Vision by Snellen chart: right eye:{vision:19455::"20/20"}, left eye:{vision:19455::"20/20"}  Body mass index is 26.57 kg/(m^2). BP 120/78  Pulse 72  Temp(Src) 97.7 F (36.5 C) (Oral)  Resp 14  Ht 5\' 3"  (1.6 m)  Wt 150 lb (68.04 kg)  BMI 26.57 kg/m2  SpO2 97%  {Exam, female:18323}     Assessment:     ***     Plan:     During the course of the visit the patient was educated and counseled about appropriate screening and preventive services including:    {plan:19836}  Diet review for nutrition referral? Yes ____  Not Indicated ____   Patient Instructions (the written plan) was given to the patient.  Medicare Attestation I have personally reviewed: The patient's medical and social  history Their use of alcohol, tobacco or illicit drugs Their current medications and supplements The patient's functional ability including ADLs,fall risks, home safety risks, cognitive, and hearing and visual impairment Diet and physical activities Evidence for depression or mood disorders  The patient's weight, height, BMI, and visual acuity have been recorded in the chart.  I have made referrals, counseling, and provided education to the patient based on review of the above and I have provided the patient with a written personalized care plan for preventive services.     Shelia Media, MD   11/08/2010

## 2010-11-10 ENCOUNTER — Telehealth: Payer: Self-pay | Admitting: Internal Medicine

## 2010-11-10 NOTE — Telephone Encounter (Signed)
Patient called and stated she was at the ob/gyn at [facility] and they think she had a reaction to the synthroid and they told her to call us.  She stated about 45 minutes after she took the synthroid she felt pressure in her throat and it radiated down her neck to her breast.  She said it seems like her BP is elevated.

## 2010-11-10 NOTE — Telephone Encounter (Signed)
Patient notified. Shannon scheduled appt.

## 2010-11-10 NOTE — Telephone Encounter (Signed)
If she had that reaction to synthroid she should stop the medication. This would be unusual after a single dose, however.  We should bring her in to discuss.

## 2010-11-11 ENCOUNTER — Ambulatory Visit (INDEPENDENT_AMBULATORY_CARE_PROVIDER_SITE_OTHER): Payer: Medicare Other | Admitting: Internal Medicine

## 2010-11-11 ENCOUNTER — Encounter: Payer: Self-pay | Admitting: Internal Medicine

## 2010-11-11 VITALS — BP 136/84 | HR 72 | Temp 98.3°F | Resp 14 | Ht 63.0 in | Wt 150.8 lb

## 2010-11-11 DIAGNOSIS — Z0000 Encounter for general adult medical examination without abnormal findings: Secondary | ICD-10-CM

## 2010-11-11 DIAGNOSIS — E039 Hypothyroidism, unspecified: Secondary | ICD-10-CM

## 2010-11-11 DIAGNOSIS — R0789 Other chest pain: Secondary | ICD-10-CM

## 2010-11-11 MED ORDER — LEVOTHYROXINE SODIUM 25 MCG PO TABS: 25.0000 ug | ORAL_TABLET | Freq: Every day | ORAL | Status: DC

## 2010-11-11 MED ORDER — ESTRADIOL 0.1 MG/24HR TD PTTW: 1.0000 | MEDICATED_PATCH | TRANSDERMAL | Status: DC

## 2010-11-11 MED ORDER — PRAVASTATIN SODIUM 40 MG PO TABS: 40.0000 mg | ORAL_TABLET | Freq: Every day | ORAL | Status: DC

## 2010-11-11 NOTE — Progress Notes (Signed)
Subjective:    Patient ID: Laura Love, female    DOB: 10-12-1940, 70 y.o.   MRN: [id number]  HPI  Laura Love is a 70 year old female with a recent diagnosis of hypothyroidism who presents after having a possible allergic reaction to Synthroid. She reports that on the morning she first took her Synthroid medication approximately 45 minutes after taking her medicine she developed pain on the roof of her mouth. This pain travels down the right side of her neck and around her right breast. She did not have any shortness of breath, diaphoresis, swelling, chest pain, palpitations or other symptoms at that time. She did not take any additional doses of Synthroid. She reports that her symptoms resolved after several minutes. However, her symptoms continued to recur intermittently for the next 3 days. They have now resolved.   She reports that both her mother and sister takes Synthroid. She reports that both her mother and sister were unable to tolerate generic Synthroid because of allergies and had to take brand name Synthroid. She also reports that they both needed very low dose of the medication.  Outpatient Encounter Prescriptions as of 11/11/2010  Medication Sig Dispense Refill  . aspirin 81 MG tablet Take 81 mg by mouth daily.        . Biotin 1000 MCG tablet Take 1,000 mcg by mouth daily.        . DiphenhydrAMINE HCl, Sleep, (NIGHTTIME SLEEP AID) 50 MG CAPS Take 2 capsules by mouth at bedtime as needed.        Marland Kitchen MAGNESIUM PO Take 2 tablets by mouth daily.        . vitamin C (ASCORBIC ACID) 500 MG tablet Take 500 mg by mouth daily.        Marland Kitchen zinc gluconate 50 MG tablet Take 50 mg by mouth daily as needed.        Marland Kitchen buPROPion (WELLBUTRIN SR) 150 MG 12 hr tablet       . ergocalciferol (VITAMIN D2) 50000 UNITS capsule Take 50,000 Units by mouth once a week.        . estradiol (VIVELLE-DOT) 0.1 MG/24HR Place 1 patch (0.1 mg total) onto the skin 2 (two) times a week.  8 patch  11  . ESTRING 2 MG vaginal  ring       . levothyroxine (SYNTHROID, LEVOTHROID) 25 MCG tablet Take 1 tablet (25 mcg total) by mouth daily.  30 tablet  11  . pravastatin (PRAVACHOL) 40 MG tablet Take 1 tablet (40 mg total) by mouth daily.  30 tablet  11     Review of Systems  Constitutional: Positive for fatigue. Negative for fever, chills, appetite change and unexpected weight change.  HENT: Negative for ear pain, congestion, sore throat, facial swelling, trouble swallowing, neck pain, voice change and sinus pressure.   Eyes: Negative for visual disturbance.  Respiratory: Negative for cough, shortness of breath, wheezing and stridor.   Cardiovascular: Positive for chest pain (right sided, going around right breast, now resolved). Negative for palpitations and leg swelling.  Gastrointestinal: Negative for nausea, vomiting, abdominal pain, diarrhea, constipation, blood in stool, abdominal distention and anal bleeding.  Genitourinary: Negative for dysuria and flank pain.  Musculoskeletal: Negative for myalgias, arthralgias and gait problem.  Skin: Negative for color change and rash.  Neurological: Negative for dizziness and headaches.  Hematological: Negative for adenopathy. Does not bruise/bleed easily.  Psychiatric/Behavioral: Negative for suicidal ideas, sleep disturbance and dysphoric mood. The patient is not nervous/anxious.  BP 136/84  Pulse 72  Temp(Src) 98.3 F (36.8 C) (Oral)  Resp 14  Ht 5\' 3"  (1.6 m)  Wt 150 lb 12 oz (68.38 kg)  BMI 26.70 kg/m2  SpO2 97%     Objective:   Physical Exam  Constitutional: She is oriented to person, place, and time. She appears well-developed and well-nourished. No distress.  HENT:  Head: Normocephalic and atraumatic.  Right Ear: External ear normal.  Left Ear: External ear normal.  Nose: Nose normal.  Mouth/Throat: Oropharynx is clear and moist. No oropharyngeal exudate.  Eyes: Conjunctivae and EOM are normal. Pupils are equal, round, and reactive to light. Right  eye exhibits no discharge. Left eye exhibits no discharge. No scleral icterus.  Neck: Normal range of motion. Neck supple. No tracheal deviation present. No thyromegaly present.  Cardiovascular: Normal rate, regular rhythm, normal heart sounds and intact distal pulses.  Exam reveals no gallop and no friction rub.   No murmur heard. Pulmonary/Chest: Effort normal and breath sounds normal. No respiratory distress. She has no wheezes. She has no rales. She exhibits no tenderness.  Musculoskeletal: Normal range of motion. She exhibits no edema and no tenderness.  Lymphadenopathy:    She has no cervical adenopathy.  Neurological: She is alert and oriented to person, place, and time. No cranial nerve deficit. She exhibits normal muscle tone. Coordination normal.  Skin: Skin is warm and dry. No rash noted. She is not diaphoretic. No erythema. No pallor.  Psychiatric: She has a normal mood and affect. Her behavior is normal. Judgment and thought content normal.          Assessment & Plan:  1. Right sided chest pain - Pt with right sided mouth, neck and chest pain after taking one pill of synthroid. Her symptoms do not seem consistent with allergic reaction, given lack of urticaria, facial edema, or wheezing. Her symptoms also don't seem consistent with excessive dose of thyroid medication, especially given that she only received one pill of this medication. She may have sensitivity or allergy to some component within the levothyroxine pill. We will try changing to brand name and will reduce her dose to 25 mcg daily. Her symptoms have completely resolved at this point. Notably she reports similar symptoms in the past which were worked up with cardiology evaluation including stress test which was normal. If her symptoms recur, then I would favor repeating a cardiology evaluation to include carotid Dopplers and stress test as well as EKG. She will return to clinic as scheduled for repeat TSH level in 6 weeks  and repeat evaluation in 8 weeks. She will call sooner should her symptoms recur or should she have any concerns.

## 2010-11-11 NOTE — Patient Instructions (Signed)
Change to brand name synthroid as we discussed. Call immediately if symptoms recur. Return for labs in 6 weeks and follow up in 8 weeks.

## 2010-11-21 ENCOUNTER — Encounter: Payer: Medicare Other | Admitting: Internal Medicine

## 2010-11-22 ENCOUNTER — Telehealth: Payer: Self-pay | Admitting: Internal Medicine

## 2010-11-22 NOTE — Telephone Encounter (Signed)
Patient called and stated her upper back feels like something is out of place and she thinks a chiropractor could fix it.  But wants [place] to put in an order for an x-ray.  Please advise.

## 2010-11-22 NOTE — Telephone Encounter (Signed)
Need to see her and examine her before xray

## 2010-11-23 ENCOUNTER — Ambulatory Visit (INDEPENDENT_AMBULATORY_CARE_PROVIDER_SITE_OTHER): Payer: Medicare Other | Admitting: Internal Medicine

## 2010-11-23 ENCOUNTER — Encounter: Payer: Self-pay | Admitting: Internal Medicine

## 2010-11-23 ENCOUNTER — Ambulatory Visit: Payer: Self-pay | Admitting: Internal Medicine

## 2010-11-23 VITALS — BP 128/80 | HR 75 | Temp 98.4°F | Resp 14 | Ht 63.0 in | Wt 146.0 lb

## 2010-11-23 DIAGNOSIS — M549 Dorsalgia, unspecified: Secondary | ICD-10-CM

## 2010-11-23 DIAGNOSIS — E039 Hypothyroidism, unspecified: Secondary | ICD-10-CM

## 2010-11-23 MED ORDER — HYDROCODONE-ACETAMINOPHEN 5-500 MG PO TABS: 2.0000 | ORAL_TABLET | Freq: Four times a day (QID) | ORAL | Status: AC | PRN

## 2010-11-23 NOTE — Progress Notes (Signed)
Subjective:    Patient ID: Laura Love, female    DOB: 1940-06-10, 70 y.o.   MRN: [id number]  HPI Laura Love is a 70 year old female who presents for an acute visit complaining of right upper back pain. She reports that this pain developed suddenly. There was no noted injury. It began approximately 2 days ago. She reports a distant history of a right shoulder injury in which she fractured her right scapula. She has been taking ibuprofen for pain. Over the last 24-hour she has had 2400 mg of ibuprofen. She reports persistent pain despite this. She has had difficulty sleeping because of the pain. She reports that the pain shoots down her right arm. She has not noted any numbness or weakness in her right arm. She has not had any fever or chills.  Outpatient Encounter Prescriptions as of 11/23/2010  Medication Sig Dispense Refill  . aspirin 81 MG tablet Take 81 mg by mouth daily.        . Biotin 1000 MCG tablet Take 1,000 mcg by mouth daily.        Marland Kitchen buPROPion (WELLBUTRIN SR) 150 MG 12 hr tablet       . DiphenhydrAMINE HCl, Sleep, (NIGHTTIME SLEEP AID) 50 MG CAPS Take 2 capsules by mouth at bedtime as needed.        . ergocalciferol (VITAMIN D2) 50000 UNITS capsule Take 50,000 Units by mouth once a week.        . estradiol (VIVELLE-DOT) 0.1 MG/24HR Place 1 patch (0.1 mg total) onto the skin 2 (two) times a week.  8 patch  11  . ibuprofen (ADVIL,MOTRIN) 200 MG tablet Take 200 mg by mouth every 6 (six) hours as needed. 2 tablets every 3 days       . MAGNESIUM PO Take 2 tablets by mouth daily.        . pravastatin (PRAVACHOL) 40 MG tablet Take 1 tablet (40 mg total) by mouth daily.  30 tablet  11  . vitamin C (ASCORBIC ACID) 500 MG tablet Take 500 mg by mouth daily.        Marland Kitchen zinc gluconate 50 MG tablet Take 50 mg by mouth daily as needed.        Marland Kitchen DISCONTD: ESTRING 2 MG vaginal ring         Review of Systems  Constitutional: Negative for fever, chills, appetite change, fatigue and unexpected  weight change.  HENT: Negative for ear pain, congestion, sore throat, trouble swallowing, neck pain, voice change and sinus pressure.   Eyes: Negative for visual disturbance.  Respiratory: Negative for cough, shortness of breath, wheezing and stridor.   Cardiovascular: Negative for chest pain, palpitations and leg swelling.  Gastrointestinal: Negative for abdominal pain.  Genitourinary: Negative for dysuria and flank pain.  Musculoskeletal: Positive for myalgias, back pain and arthralgias. Negative for gait problem.  Skin: Negative for color change and rash.  Neurological: Negative for dizziness and headaches.  Hematological: Negative for adenopathy. Does not bruise/bleed easily.  Psychiatric/Behavioral: Negative for suicidal ideas, sleep disturbance and dysphoric mood. The patient is not nervous/anxious.    BP 128/80  Pulse 75  Temp(Src) 98.4 F (36.9 C) (Oral)  Resp 14  Ht 5\' 3"  (1.6 m)  Wt 146 lb (66.225 kg)  BMI 25.86 kg/m2  SpO2 98%     Objective:   Physical Exam  Constitutional: She is oriented to person, place, and time. She appears well-developed and well-nourished. No distress.  HENT:  Head: Normocephalic and atraumatic.  Right Ear: External ear normal.  Left Ear: External ear normal.  Nose: Nose normal.  Mouth/Throat: Oropharynx is clear and moist. No oropharyngeal exudate.  Eyes: Conjunctivae are normal. Pupils are equal, round, and reactive to light. Right eye exhibits no discharge. Left eye exhibits no discharge. No scleral icterus.  Neck: Normal range of motion. Neck supple. No tracheal deviation present. No thyromegaly present.  Cardiovascular: Normal rate, regular rhythm, normal heart sounds and intact distal pulses.  Exam reveals no gallop and no friction rub.   No murmur heard. Pulmonary/Chest: Effort normal and breath sounds normal. No respiratory distress. She has no wheezes. She has no rales. She exhibits no tenderness.  Musculoskeletal: Normal range of  motion. She exhibits no edema and no tenderness.       Cervical back: She exhibits tenderness. She exhibits no swelling, no edema and no deformity.       Thoracic back: She exhibits tenderness. She exhibits normal range of motion, no bony tenderness, no swelling and no deformity.  Lymphadenopathy:    She has no cervical adenopathy.  Neurological: She is alert and oriented to person, place, and time. No cranial nerve deficit. She exhibits normal muscle tone. Coordination normal.  Skin: Skin is warm and dry. No rash noted. She is not diaphoretic. No erythema. No pallor.  Psychiatric: She has a normal mood and affect. Her behavior is normal. Judgment and thought content normal.          Assessment & Plan:  1. Back pain - patient with sudden onset of right upper back pain. There is no known injury. Her exam is normal. Question if she may have a compression fracture in the cervical or thoracic spine. Will get plain films of her cervical and thoracic spine today. She will continue ibuprofen. I have encouraged her to use no more than 800 mg every 8 hours. She will use Vicodin as needed for severe pain. She will return to clinic in 2 weeks.  2. Hypothyroidism - patient has not yet started Synthroid for hypothyroidism. I encouraged her to start this today. We will plan to recheck her TSH in 6 weeks.

## 2010-11-23 NOTE — Patient Instructions (Signed)
Continue ibuprofen, 800mg  up to three times per day (four pills up to three times per day).  Use vicodin  1-2 pills for severe pain up to twice per day. DO NOT DRIVE when taking vicodin.

## 2010-11-24 ENCOUNTER — Telehealth: Payer: Self-pay | Admitting: Internal Medicine

## 2010-11-24 NOTE — Telephone Encounter (Signed)
Patient stated you ordered a X-ray she had it done yesterday.

## 2010-11-24 NOTE — Telephone Encounter (Signed)
error 

## 2010-11-25 ENCOUNTER — Other Ambulatory Visit: Payer: Self-pay | Admitting: Internal Medicine

## 2010-11-25 ENCOUNTER — Telehealth: Payer: Self-pay | Admitting: *Deleted

## 2010-11-25 MED ORDER — CYCLOBENZAPRINE HCL 5 MG PO TABS: 5.0000 mg | ORAL_TABLET | Freq: Three times a day (TID) | ORAL | Status: AC | PRN

## 2010-11-25 NOTE — Telephone Encounter (Signed)
Message copied by Jobie Quaker on Fri Nov 25, 2010  8:58 AM ------      Message from: Ronna Polio A      Created: Thu Nov 24, 2010 12:20 PM      Regarding: XRAY spine       XRAYs showed some narrowing of the disc spaces in the cervical spine.  If her pain is still present, we should get an MRI of the cervical spine.

## 2010-11-25 NOTE — Telephone Encounter (Signed)
Patient called requesting the M.D. To give her an rx for skelaxin to help her pain. Patient would like a call back with advise.

## 2010-11-25 NOTE — Telephone Encounter (Signed)
Pt. Aware.

## 2010-11-25 NOTE — Telephone Encounter (Signed)
Can you please set up MRI cervical spine? Thanks

## 2010-11-25 NOTE — Telephone Encounter (Signed)
Patient notified. She does want MRI done, she is still in a lot of pain.

## 2010-11-25 NOTE — Telephone Encounter (Signed)
Message copied by Jobie Quaker on Fri Nov 25, 2010  8:57 AM ------      Message from: Ronna Polio A      Created: Thu Nov 24, 2010 12:20 PM      Regarding: XRAY spine       XRAYs showed some narrowing of the disc spaces in the cervical spine.  If her pain is still present, we should get an MRI of the cervical spine.

## 2010-11-25 NOTE — Telephone Encounter (Signed)
I called in Flexeril for her.

## 2010-11-29 ENCOUNTER — Other Ambulatory Visit: Payer: Self-pay | Admitting: Internal Medicine

## 2010-11-29 DIAGNOSIS — M542 Cervicalgia: Secondary | ICD-10-CM

## 2010-11-29 NOTE — Telephone Encounter (Signed)
I need an order for this, it is MRI cervical spine w/o contrast, dx: neck and shoulder pain pre-cert is [id number]

## 2010-11-30 ENCOUNTER — Ambulatory Visit: Payer: Self-pay | Admitting: Internal Medicine

## 2010-11-30 ENCOUNTER — Other Ambulatory Visit: Payer: Self-pay | Admitting: Internal Medicine

## 2010-11-30 DIAGNOSIS — M549 Dorsalgia, unspecified: Secondary | ICD-10-CM

## 2010-12-02 ENCOUNTER — Encounter: Payer: Self-pay | Admitting: Internal Medicine

## 2010-12-14 ENCOUNTER — Telehealth: Payer: Self-pay | Admitting: Internal Medicine

## 2010-12-14 NOTE — Telephone Encounter (Signed)
Insurance has changed on what they pay  On her estradiol.  They will no longer pay for generic She needs estradiol 0.1mg  apply 1 patch 2x week.  Called in to [facility] [address]

## 2010-12-16 ENCOUNTER — Encounter: Payer: Self-pay | Admitting: Internal Medicine

## 2010-12-16 ENCOUNTER — Other Ambulatory Visit: Payer: Self-pay | Admitting: Internal Medicine

## 2010-12-16 DIAGNOSIS — Z0000 Encounter for general adult medical examination without abnormal findings: Secondary | ICD-10-CM

## 2010-12-16 MED ORDER — ESTRADIOL 0.1 MG/24HR TD PTTW: 1.0000 | MEDICATED_PATCH | TRANSDERMAL | Status: DC

## 2010-12-16 NOTE — Telephone Encounter (Signed)
Can you please clarify this?

## 2010-12-16 NOTE — Telephone Encounter (Signed)
Pt talked to drug store Pharmacy told her she has to order climara and not estradiol   cliamra is once  a week not twice a week. This has 5 different strengh Depends on your symptons and weight for which strength symptons are pt wants to throw things Pt has been out for week    [facility] [address]

## 2010-12-21 ENCOUNTER — Other Ambulatory Visit: Payer: Medicare Other

## 2010-12-22 ENCOUNTER — Ambulatory Visit (INDEPENDENT_AMBULATORY_CARE_PROVIDER_SITE_OTHER): Payer: Medicare Other | Admitting: Internal Medicine

## 2010-12-22 DIAGNOSIS — Z23 Encounter for immunization: Secondary | ICD-10-CM

## 2010-12-23 NOTE — Telephone Encounter (Signed)
I called [facility] pharmacy and they are going to send in the Climara 0.1mg  and see if her insurance will cover this.  This is equilavant to the estradoil.

## 2010-12-28 ENCOUNTER — Other Ambulatory Visit: Payer: Medicare Other | Admitting: Internal Medicine

## 2011-01-10 ENCOUNTER — Other Ambulatory Visit (INDEPENDENT_AMBULATORY_CARE_PROVIDER_SITE_OTHER): Payer: Medicare Other | Admitting: *Deleted

## 2011-01-10 DIAGNOSIS — E039 Hypothyroidism, unspecified: Secondary | ICD-10-CM

## 2011-01-10 LAB — TSH: TSH: 2.36 u[IU]/mL (ref 0.35–5.50)

## 2011-01-11 ENCOUNTER — Other Ambulatory Visit: Payer: Medicare Other

## 2011-01-16 ENCOUNTER — Ambulatory Visit: Payer: Medicare Other | Admitting: Internal Medicine

## 2011-02-03 ENCOUNTER — Other Ambulatory Visit: Payer: Self-pay | Admitting: Internal Medicine

## 2011-07-14 ENCOUNTER — Other Ambulatory Visit: Payer: Self-pay | Admitting: Family Medicine

## 2011-07-14 DIAGNOSIS — Z1231 Encounter for screening mammogram for malignant neoplasm of breast: Secondary | ICD-10-CM

## 2011-08-02 ENCOUNTER — Ambulatory Visit
Admission: RE | Admit: 2011-08-02 | Discharge: 2011-08-02 | Disposition: A | Discharge status: Home / Self Care | Payer: Medicare Other | Source: Ambulatory Visit | Attending: Family Medicine | Admitting: Family Medicine

## 2011-08-02 DIAGNOSIS — Z1231 Encounter for screening mammogram for malignant neoplasm of breast: Secondary | ICD-10-CM

## 2011-08-29 ENCOUNTER — Ambulatory Visit: Payer: Self-pay

## 2012-09-27 ENCOUNTER — Ambulatory Visit: Payer: Self-pay | Admitting: Family Medicine

## 2012-09-29 IMAGING — CR DG CHEST 2V
1 series · 2 of 2 positions shown · non-contrast
Comparison: none

REASON FOR EXAM: cough
COMMENTS:

PROCEDURE:     KDR - KDXR CHEST PA (OR AP) AND LAT  - October 19, 2010  [DATE]
RESULT:     The lung fields are clear. No pneumonia, pneumothorax or pleural
effusion is seen. The heart size is normal. The mediastinal and osseous
structures show no significant abnormalities.

[Series 1: view not recorded · 0.17mm/px · 2 of 2 slices shown]
[im 1/2]
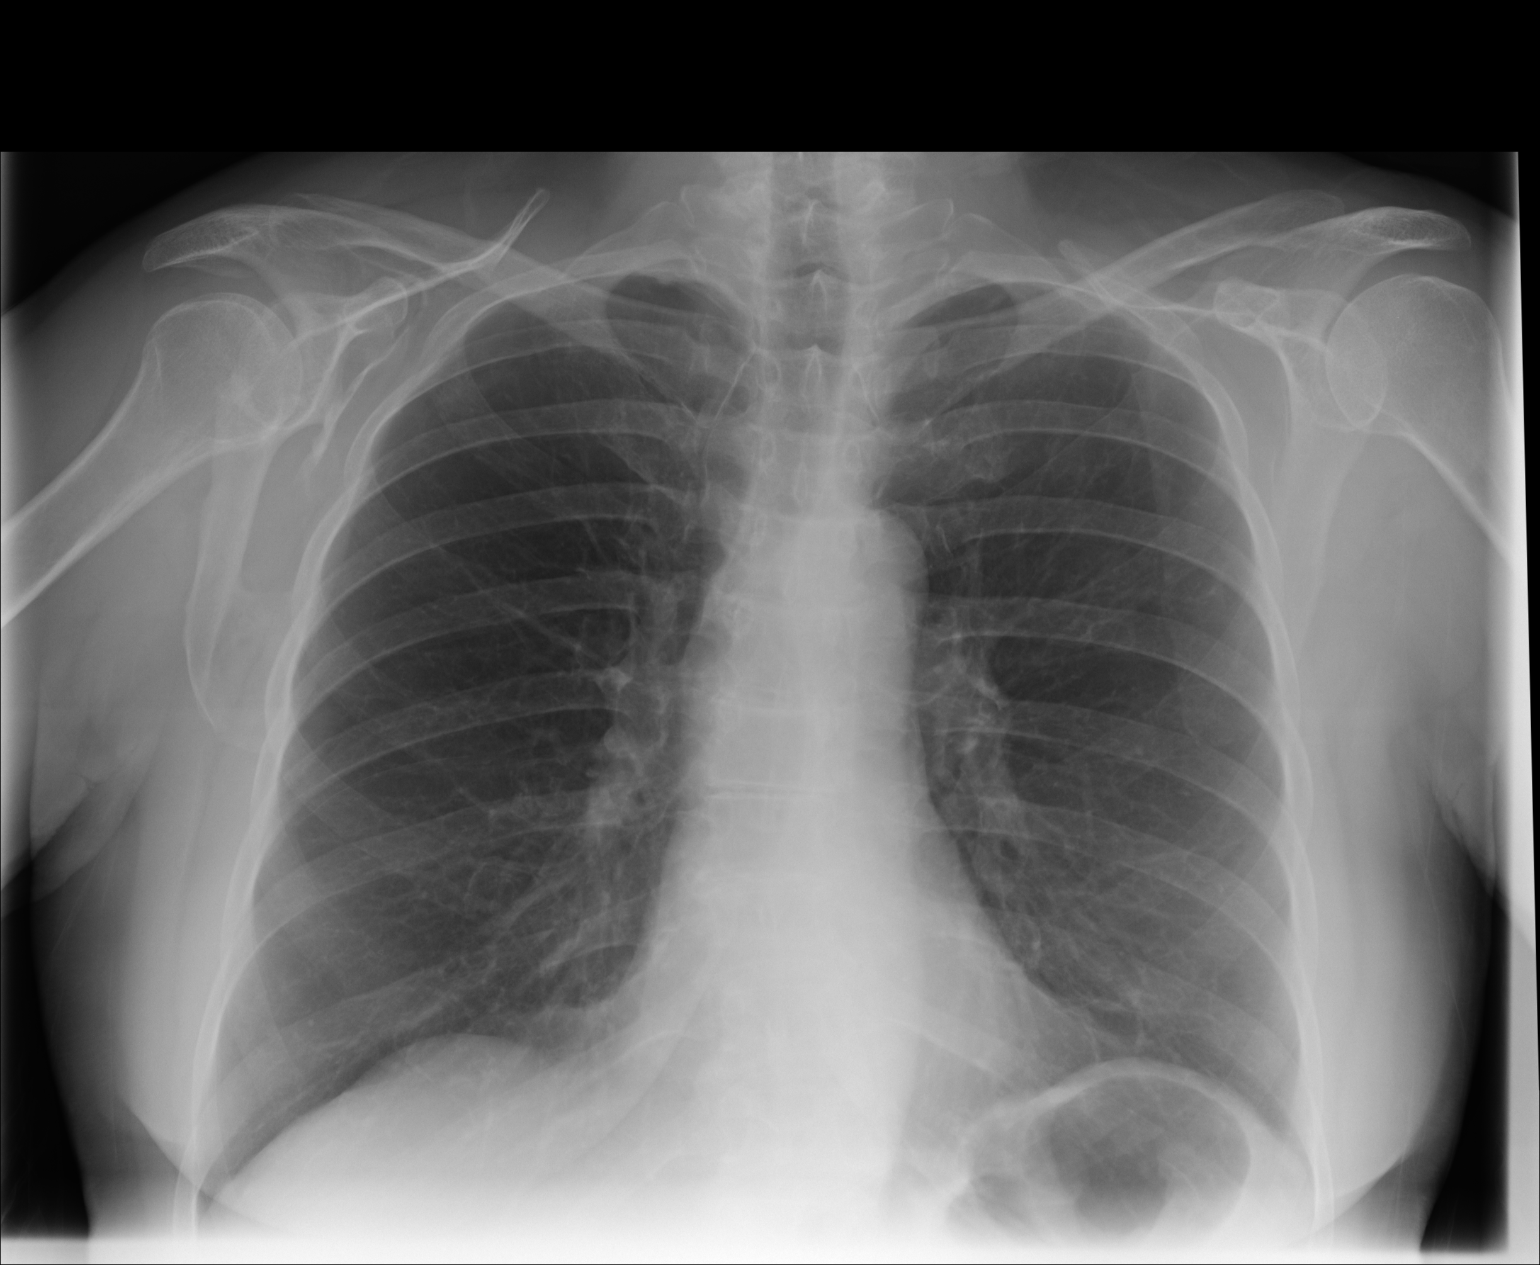
[im 2/2]
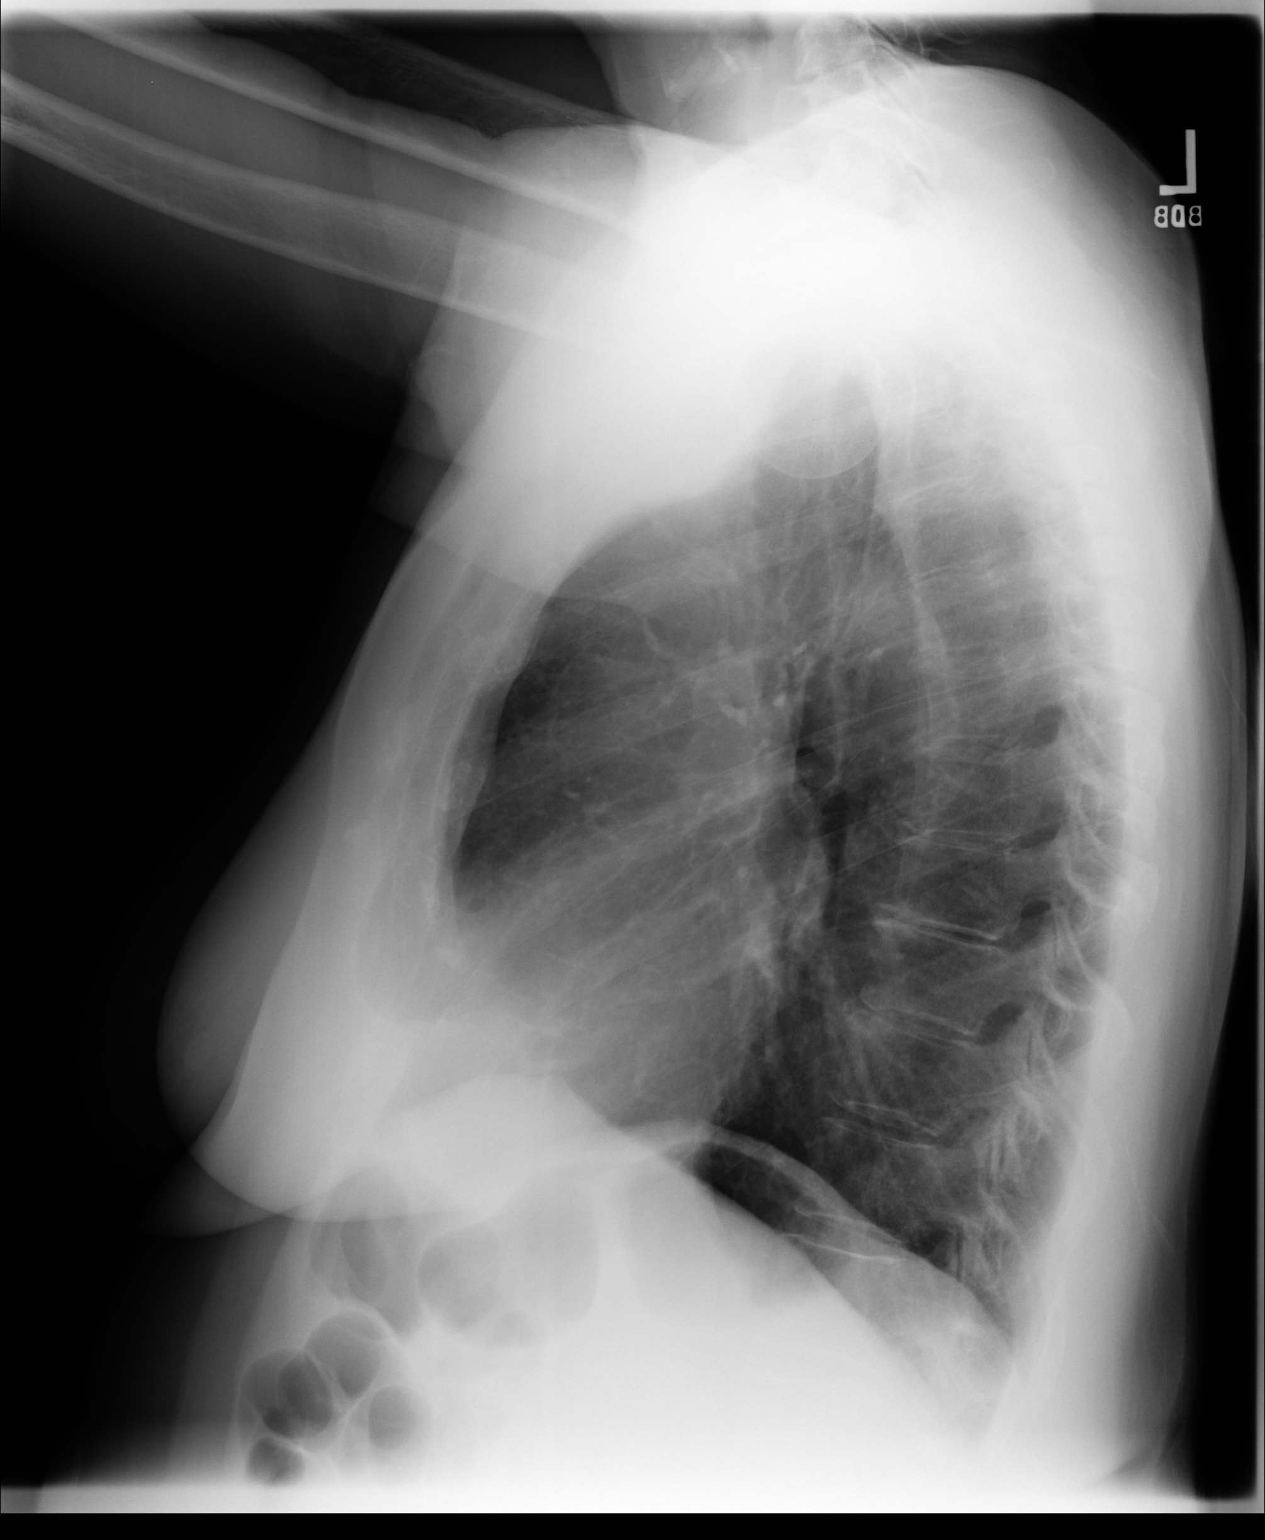

[2 of 2 positions shown; findings below may reference images not displayed]

IMPRESSION: No significant abnormalities are noted.

## 2012-11-03 IMAGING — CR CERVICAL SPINE - 2-3 VIEW
1 series · 4 of 4 positions shown · non-contrast
Comparison: none

REASON FOR EXAM: pain
COMMENTS:

[Series 1: view not recorded · 0.17mm/px · 4 of 4 slices shown]
[im 1/4]
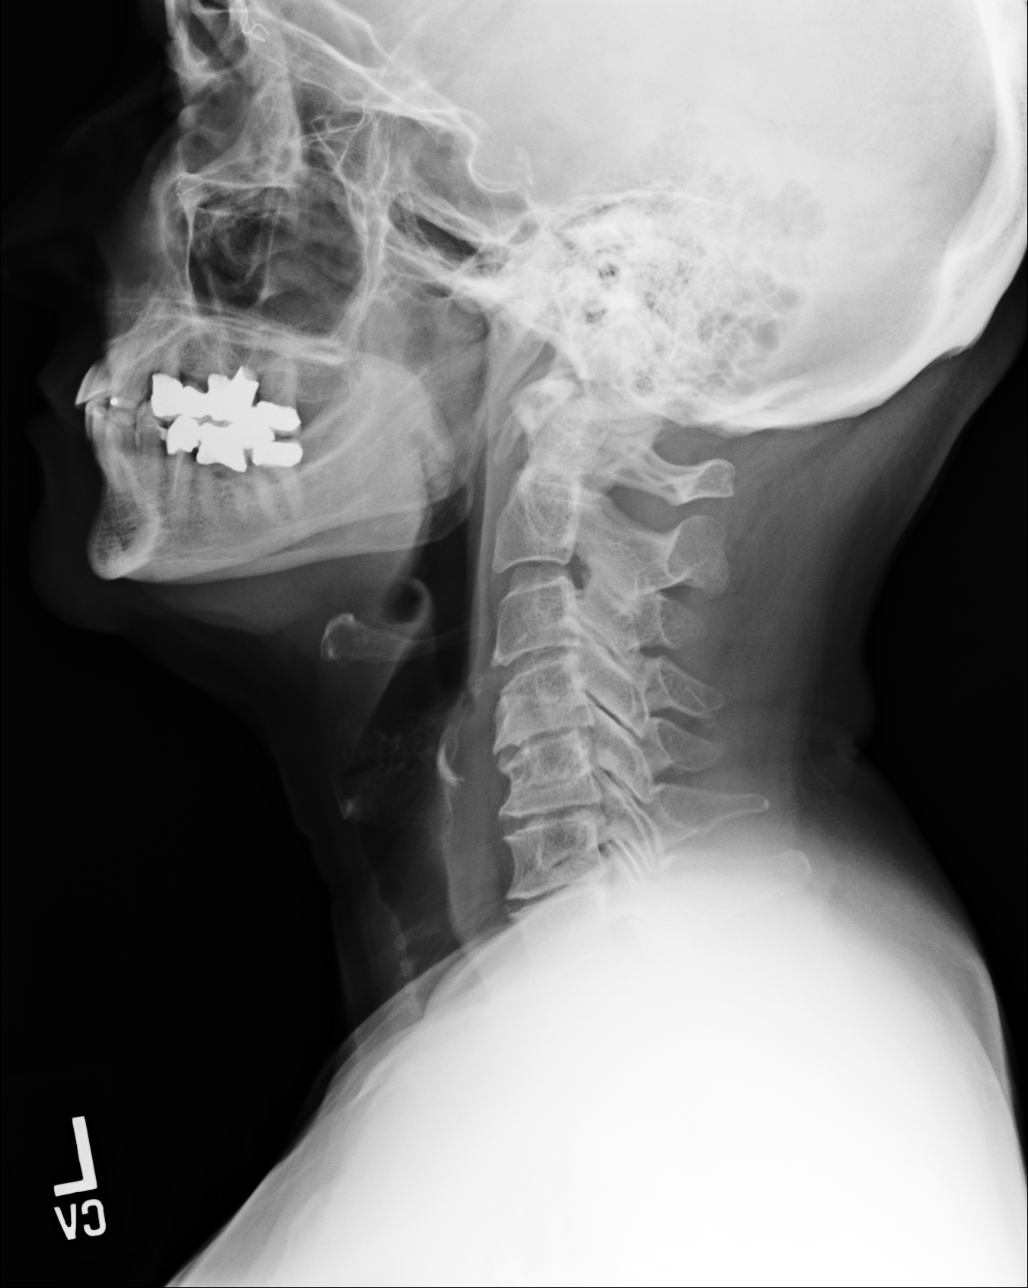
[im 2/4]
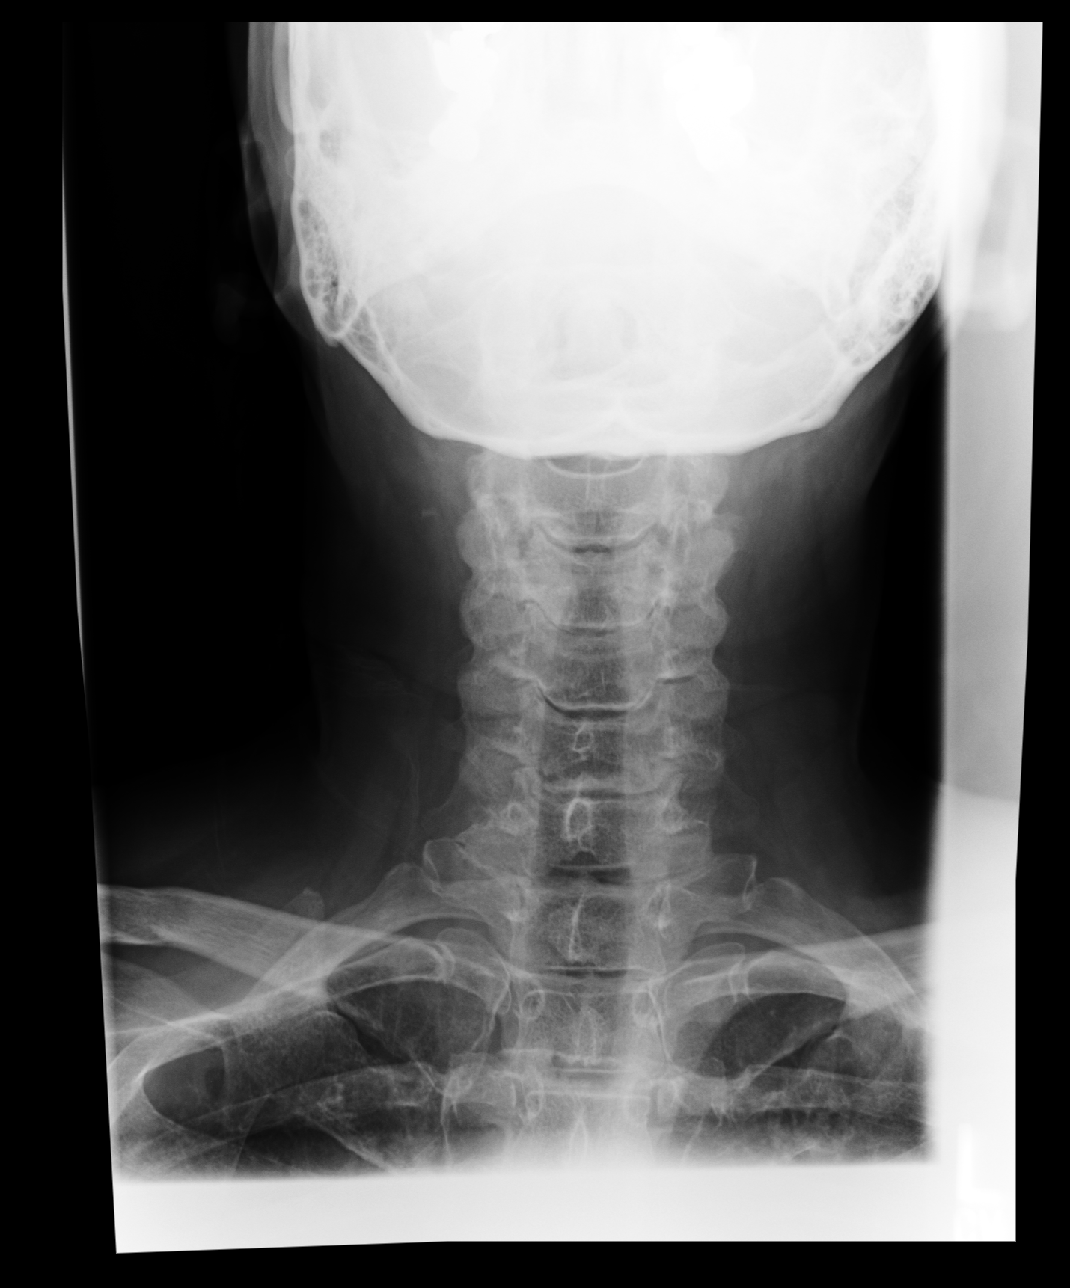
[im 3/4]
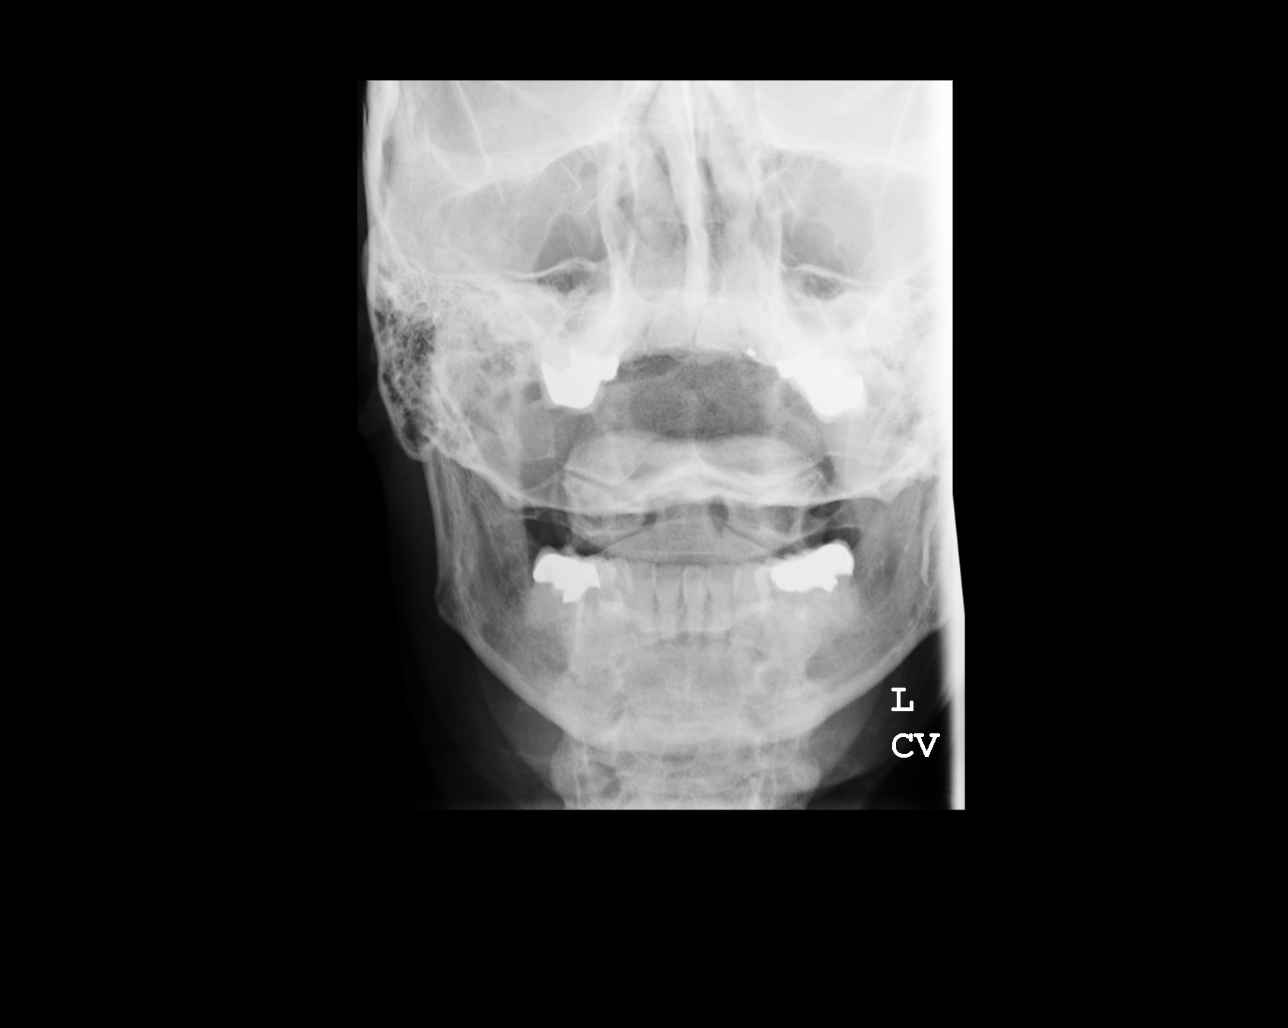
[im 4/4]
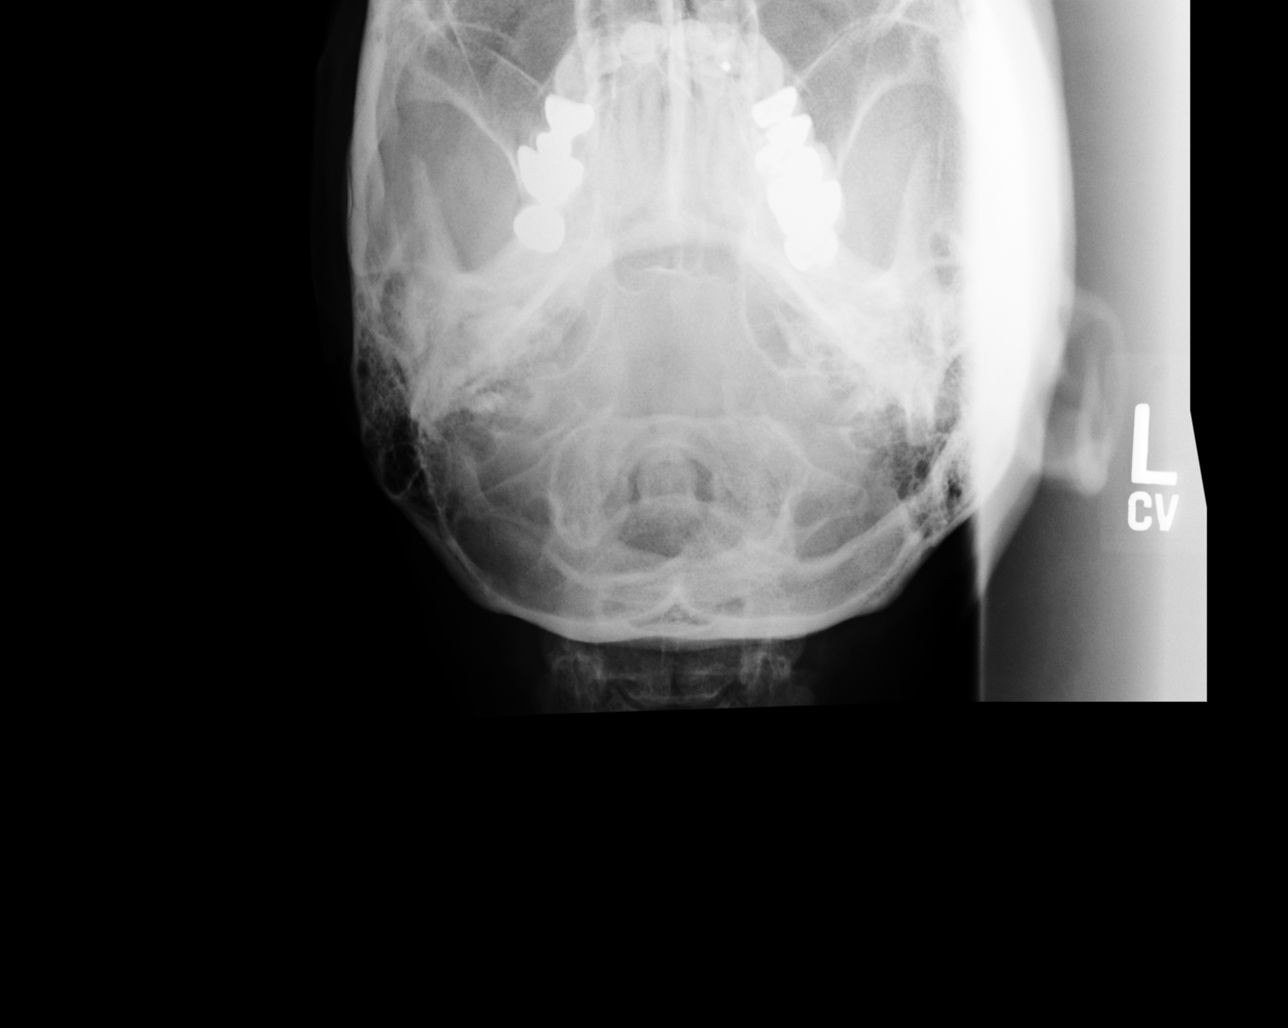

[4 of 4 positions shown; findings below may reference images not displayed]

PROCEDURE:     KDR - KDXR C-SPINE AP AND LATERAL  - November 23, 2010  [DATE]

RESULT:     The vertebral body heights are well-maintained. No fracture is
seen. There is narrowing of the intervertebral disc spaces at C4-C5, C5-C6
and C6-C7. Additionally, there is mild narrowing at C3-C4. The changes are
consistent with cervical disc disease at multiple levels. This could be
further evaluated by MR if clinically indicated. The odontoid process is
intact. Vertebral body alignment is normal. No lytic or blastic lesions are
seen.
IMPRESSION: 1. No fracture is seen.
2. There are changes of cervical disc disease at multiple levels as noted
above. This could be further evaluated by MR if clinically indicated.

## 2012-11-03 IMAGING — CR DG THORACIC SPINE 2-3V
1 series · 3 of 3 positions shown · non-contrast
Comparison: none

REASON FOR EXAM: pain
COMMENTS:

[Series 1: view not recorded · 0.17mm/px · 3 of 3 slices shown]
[im 1/3]
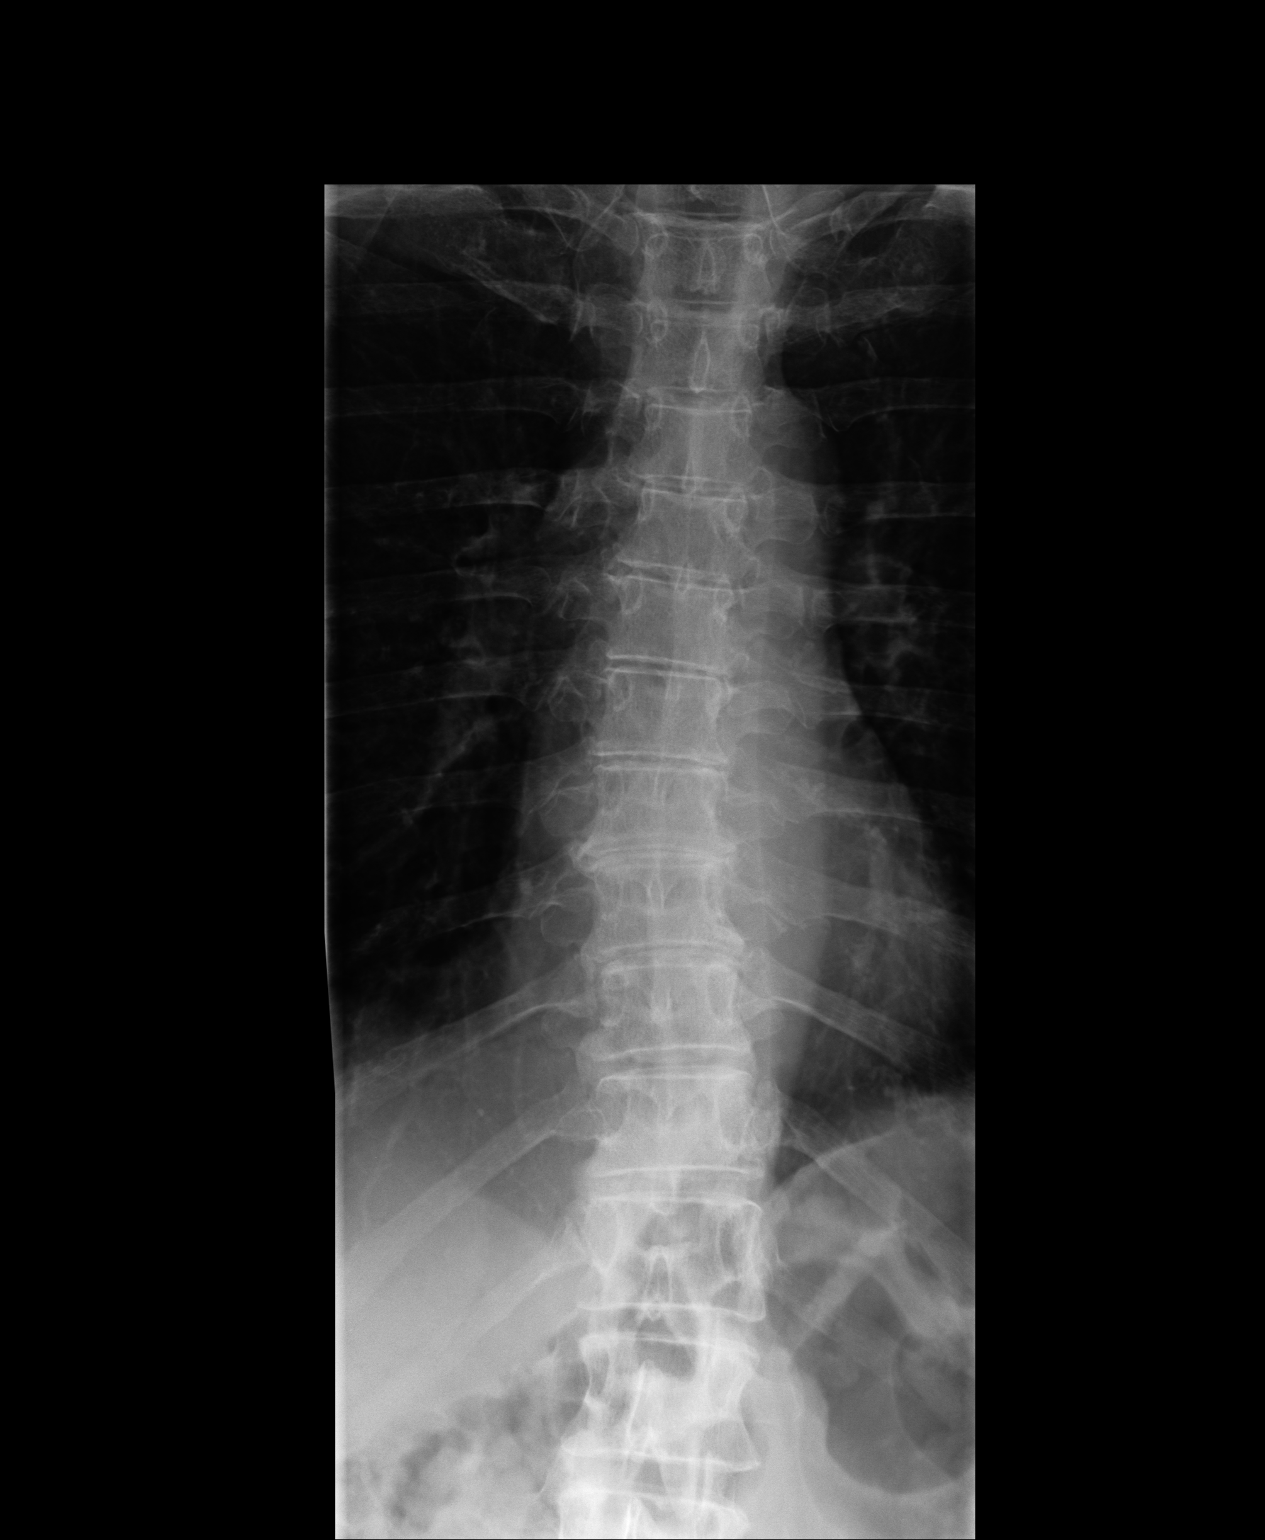
[im 2/3]
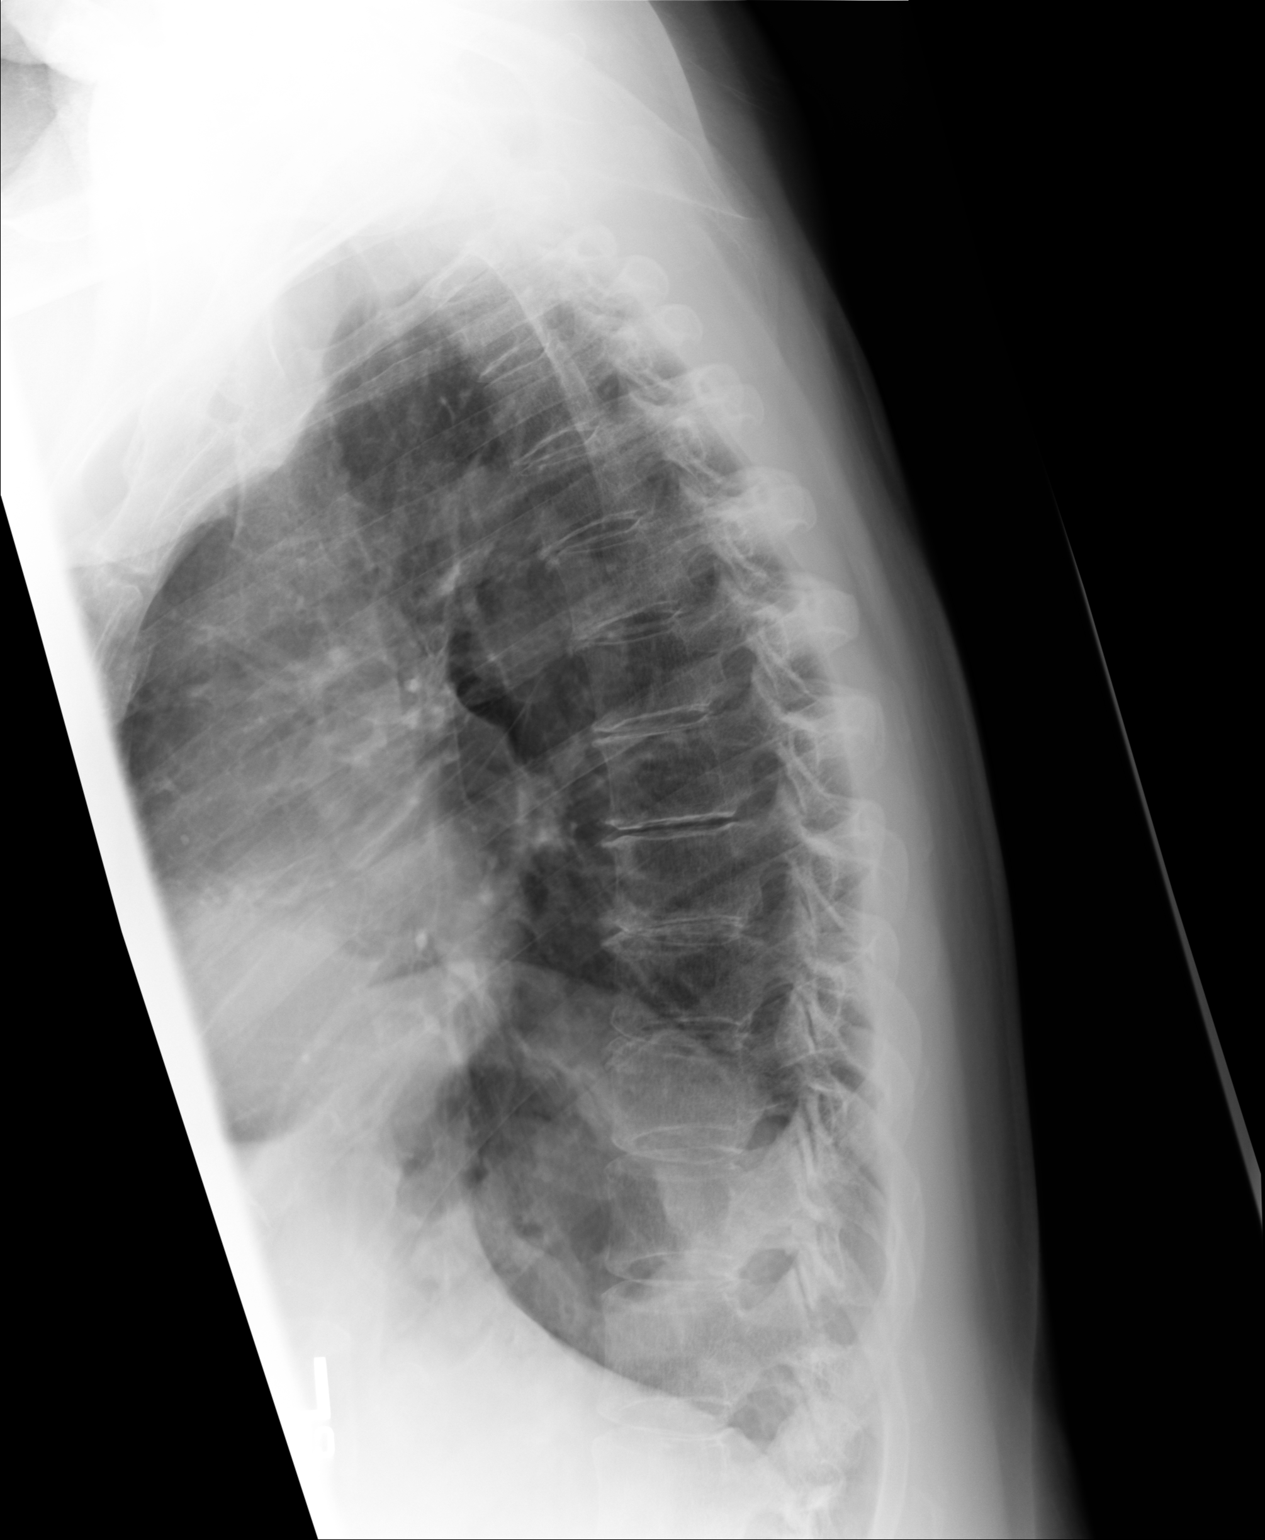
[im 3/3]
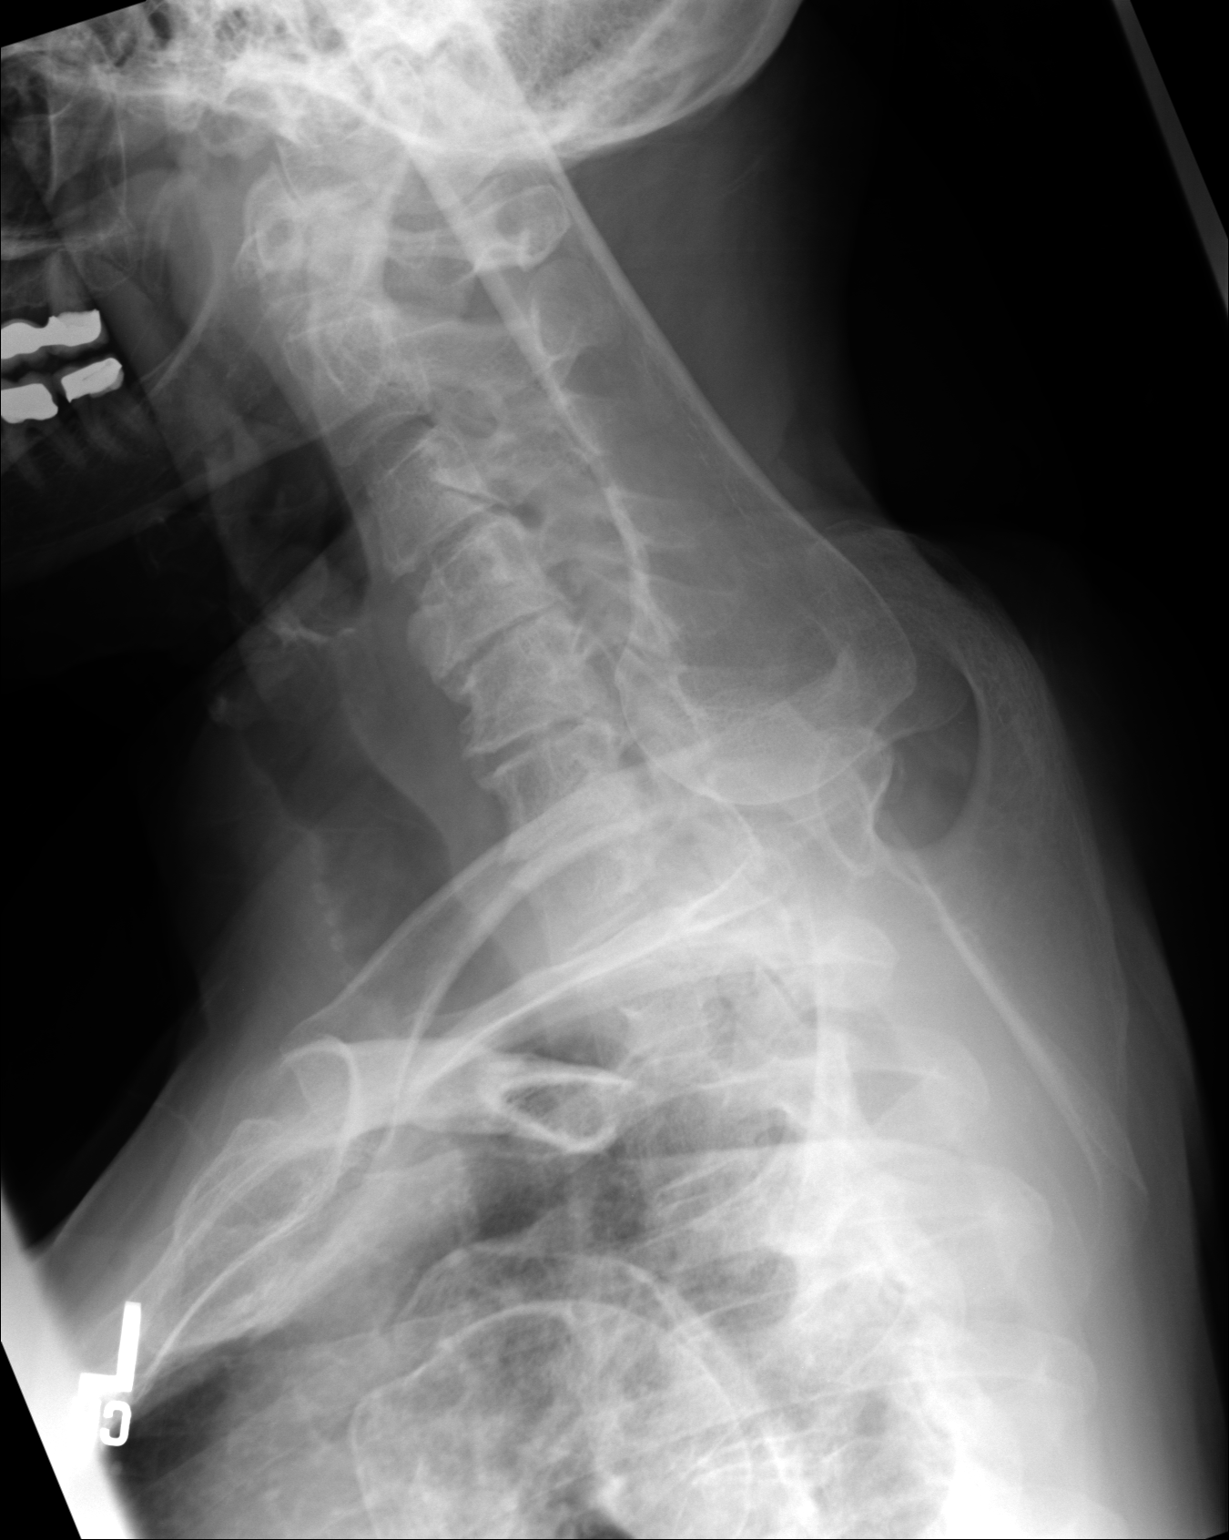

[3 of 3 positions shown; findings below may reference images not displayed]

PROCEDURE:     KDR - KDXR THORACIC AP AND LATERAL  - November 23, 2010  [DATE]

RESULT:     The vertebral body heights and the intervertebral disc spaces
are well maintained. The vertebral body alignment is normal. The pedicles
are bilaterally intact. No lytic or blastic lesions are seen. There is a
slight thoracic scoliosis with the convexity to the right and which measures
approximately 7 degrees.
IMPRESSION: 1. No acute bony abnormalities of the thoracic spine are noted.
2. A slight thoracic scoliosis is observed.

## 2012-11-22 ENCOUNTER — Other Ambulatory Visit: Payer: Self-pay

## 2012-11-22 DIAGNOSIS — Z1231 Encounter for screening mammogram for malignant neoplasm of breast: Secondary | ICD-10-CM

## 2012-12-12 ENCOUNTER — Ambulatory Visit
Admission: RE | Admit: 2012-12-12 | Discharge: 2012-12-12 | Disposition: A | Discharge status: Home / Self Care | Payer: Medicare Other | Source: Ambulatory Visit

## 2012-12-12 DIAGNOSIS — Z1231 Encounter for screening mammogram for malignant neoplasm of breast: Secondary | ICD-10-CM

## 2014-01-12 ENCOUNTER — Other Ambulatory Visit: Payer: Self-pay | Admitting: Internal Medicine

## 2014-01-12 ENCOUNTER — Other Ambulatory Visit: Payer: Self-pay

## 2014-01-12 DIAGNOSIS — Z1231 Encounter for screening mammogram for malignant neoplasm of breast: Secondary | ICD-10-CM

## 2014-02-03 ENCOUNTER — Ambulatory Visit
Admission: RE | Admit: 2014-02-03 | Discharge: 2014-02-03 | Disposition: A | Discharge status: Home / Self Care | Payer: Self-pay | Source: Ambulatory Visit

## 2014-02-03 ENCOUNTER — Other Ambulatory Visit: Payer: Self-pay

## 2014-02-03 ENCOUNTER — Ambulatory Visit: Payer: Medicare Other

## 2014-02-03 DIAGNOSIS — Z1231 Encounter for screening mammogram for malignant neoplasm of breast: Secondary | ICD-10-CM

## 2014-02-13 ENCOUNTER — Ambulatory Visit: Payer: Self-pay

## 2014-12-21 ENCOUNTER — Ambulatory Visit (INDEPENDENT_AMBULATORY_CARE_PROVIDER_SITE_OTHER): Payer: Medicare Other | Admitting: Family Medicine

## 2014-12-21 ENCOUNTER — Encounter: Payer: Self-pay | Admitting: Family Medicine

## 2014-12-21 VITALS — BP 112/64 | HR 79 | Temp 98.1°F | Resp 16 | Ht 62.75 in | Wt 148.0 lb

## 2014-12-21 DIAGNOSIS — E785 Hyperlipidemia, unspecified: Secondary | ICD-10-CM

## 2014-12-21 DIAGNOSIS — Z0000 Encounter for general adult medical examination without abnormal findings: Secondary | ICD-10-CM

## 2014-12-21 DIAGNOSIS — E559 Vitamin D deficiency, unspecified: Secondary | ICD-10-CM

## 2014-12-21 DIAGNOSIS — Z86010 Personal history of colonic polyps: Secondary | ICD-10-CM | POA: Diagnosis not present

## 2014-12-21 DIAGNOSIS — E039 Hypothyroidism, unspecified: Secondary | ICD-10-CM | POA: Diagnosis not present

## 2014-12-21 DIAGNOSIS — Z860101 Personal history of adenomatous and serrated colon polyps: Secondary | ICD-10-CM

## 2014-12-21 NOTE — Progress Notes (Signed)
Patient: Laura Love, Female    DOB: 01/03/1941, 74 y.o.   MRN: [id number] Visit Date: 12/21/2014  Today's Provider: [name], MD   Chief Complaint  Patient presents with  . Establish Care    re- establish care (last office visit 09/15/2010)  . Medicare Wellness   Subjective:    Annual wellness visit/ Re-Establish care Laura Love is a 74 y.o. female. She feels well. She reports exercising 3-4 times a week. She reports she is sleeping well. Last office visit was 09/15/2010. Patient previous caregiver was a PA at [facility] in [place]. Reason for leaving that practice was : patient not satisfied with not being able to be seen and not able to understand her results when called over the phone.   -----------------------------------------------------------  Lipid/Cholesterol, Follow-up:    . Last Lipid Panel:    Component Value Date/Time   CHOL 229* 10/24/2010 1034   TRIG 127.0 10/24/2010 1034   HDL 63.70 10/24/2010 1034   CHOLHDL 4 10/24/2010 1034   VLDL 25.4 10/24/2010 1034   LDLDIRECT 159.3 10/24/2010 1034    Risk factors for vascular disease include hypercholesterolemia  She reports good compliance with treatment. She is not having side effects.  Current symptoms include none and have been stable. Weight trend: stable Prior visit with dietician: no Current diet: in general, a "healthy" diet   Current exercise: walking  Wt Readings from Last 3 Encounters:  11/23/10 146 lb (66.225 kg)  11/11/10 150 lb 12 oz (68.38 kg)  11/08/10 150 lb (68.04 kg)    -------------------------------------------------------------------    Review of Systems  Constitutional: Negative for fever, chills and fatigue.  HENT: Negative for congestion, ear pain, rhinorrhea, sneezing and sore throat.   Eyes: Negative.  Negative for pain and redness.  Respiratory: Negative for cough, shortness of breath and wheezing.   Cardiovascular: Negative for chest  pain and leg swelling.  Gastrointestinal: Positive for constipation. Negative for nausea, abdominal pain, diarrhea and blood in stool.  Endocrine: Negative for polydipsia and polyphagia.  Genitourinary: Negative.  Negative for dysuria, hematuria, flank pain, vaginal bleeding, vaginal discharge and pelvic pain.  Musculoskeletal: Positive for back pain, neck pain and neck stiffness. Negative for joint swelling, arthralgias and gait problem.  Skin: Negative for rash.  Neurological: Negative.  Negative for dizziness, tremors, seizures, weakness, light-headedness, numbness and headaches.  Hematological: Negative for adenopathy.  Psychiatric/Behavioral: Negative.  Negative for behavioral problems, confusion and dysphoric mood. The patient is not nervous/anxious and is not hyperactive.     Social History   Social History  . Marital Status: Married    Spouse Name: N/A  . Number of Children: N/A  . Years of Education: N/A   Occupational History  . Retired    Social History Main Topics  . Smoking status: Former Research scientist (life sciences)  . Smokeless tobacco: Never Used  . Alcohol Use: No  . Drug Use: No  . Sexual Activity: Not on file   Other Topics Concern  . Not on file   Social History Narrative   Lives with husband in [place], [place]. Raised horses.    Patient Active Problem List   Diagnosis Date Noted  . Hypothyroidism 11/08/2010  . Hyperlipemia 10/19/2010  . Bladder incontinence 10/19/2010  . Vaginal prolapse 10/19/2010    Past Surgical History  Procedure Laterality Date  . Abdominal hysterectomy  1988    for endometriosis  . Closed reduction facial fracture  Her family history includes Cancer in her mother and sister; Hyperlipidemia in her brother, mother, and sister; Hypertension in her mother.    Previous Medications   BIOTIN 1000 MCG TABLET    Take 1,000 mcg by mouth daily.     BUPROPION (WELLBUTRIN SR) 150 MG 12 HR TABLET    Take 150 mg by mouth 2 (two) times daily.     DIPHENHYDRAMINE (BENADRYL) 25 MG CAPSULE    Take 25 mg by mouth at bedtime as needed.   DIPHENHYDRAMINE HCL, SLEEP, (NIGHTTIME SLEEP AID) 50 MG CAPS    Take 1 capsule by mouth at bedtime as needed.    ESTRADIOL (VIVELLE-DOT) 0.1 MG/24HR    Place 1 patch (0.1 mg total) onto the skin 2 (two) times a week.   FENOFIBRATE 160 MG TABLET    Take 160 mg by mouth every other day.   IBUPROFEN (ADVIL,MOTRIN) 200 MG TABLET    Take 200 mg by mouth every 6 (six) hours as needed. 2 tablets every 3 days    LEVOTHYROXINE (SYNTHROID) 100 MCG TABLET    Take 100 mcg by mouth daily.   MAGNESIUM PO    Take 2 tablets by mouth daily.     MULTIPLE VITAMINS-MINERALS (MULTIVITAMIN ADULT PO)    Take 1 tablet by mouth daily.   PRAVASTATIN (PRAVACHOL) 40 MG TABLET    Take 1 tablet (40 mg total) by mouth every other day.   SENNOSIDES-DOCUSATE SODIUM (SENOKOT-S) 8.6-50 MG TABLET    Take 2-3 tablets by mouth 2 (two) times daily.   VITAMIN C (ASCORBIC ACID) 500 MG TABLET    Take 500 mg by mouth daily.     VITAMIN D, CHOLECALCIFEROL, 1000 UNITS TABS    Take 2 tablets by mouth daily.   ZINC GLUCONATE 50 MG TABLET    Take 50 mg by mouth daily as needed.      Patient Care Team: Birdie Sons, MD as PCP - General (Family Medicine)     Objective:   Vitals: BP 112/64 mmHg  Pulse 79  Temp(Src) 98.1 F (36.7 C) (Oral)  Resp 16  Ht 5' 2.75" (1.594 m)  Wt 148 lb (67.132 kg)  BMI 26.42 kg/m2  SpO2 95%  Physical Exam   General Appearance:    Alert, cooperative, no distress, appears stated age  Head:    Normocephalic, without obvious abnormality, atraumatic  Eyes:    PERRL, conjunctiva/corneas clear, EOM's intact, fundi    benign, both eyes  Ears:    Normal TM's and external ear canals, both ears  Nose:   Nares normal, septum midline, mucosa normal, no drainage    or sinus tenderness  Throat:   Lips, mucosa, and tongue normal; teeth and gums normal  Neck:   Supple, symmetrical, trachea midline, no adenopathy;     thyroid:  no enlargement/tenderness/nodules; no carotid   bruit or JVD  Back:     Symmetric, no curvature, ROM normal, no CVA tenderness  Lungs:     Clear to auscultation bilaterally, respirations unlabored  Chest Wall:    No tenderness or deformity   Heart:    Regular rate and rhythm, S1 and S2 normal, no murmur, rub   or gallop  Breast Exam:    normal appearance, no masses or tenderness, deferred  Abdomen:     Soft, non-tender, bowel sounds active all four quadrants,    no masses, no organomegaly  Pelvic:    deferred  Extremities:   Extremities normal, atraumatic, no cyanosis  or edema  Pulses:   2+ and symmetric all extremities  Skin:   Skin color, texture, turgor normal, no rashes or lesions  Lymph nodes:   Cervical, supraclavicular, and axillary nodes normal  Neurologic:   CNII-XII intact, normal strength, sensation and reflexes    throughout    Activities of Daily Living In your present state of health, do you have any difficulty performing the following activities: 12/21/2014  Hearing? N  Vision? N  Difficulty concentrating or making decisions? N  Walking or climbing stairs? Y  Dressing or bathing? N  Doing errands, shopping? N    Fall Risk Assessment Fall Risk  12/21/2014  Falls in the past year? No     Depression Screen PHQ 2/9 Scores 12/21/2014  PHQ - 2 Score 0    Cognitive Testing - 6-CIT  Correct? Score   What year is it? yes 0 0 or 4  What month is it? yes 0 0 or 3  Memorize:    Pia Mau,  [birth year],  [place],      What time is it? (within 1 hour) yes 0 0 or 3  Count backwards from 20 yes 0 0, 2, or 4  Name the months of the year yes 0 0, 2, or 4  Repeat name & address above no 3 0, 2, 4, 6, 8, or 10       TOTAL SCORE  3/28   Interpretation:  Normal  Normal (0-7) Abnormal (8-28)    Audit-C Alcohol Use Screening  Question Answer Points  How often do you have alcoholic drink? never 0  On days you do drink alcohol, how many drinks do you  typically consume? 0 0  How oftey will you drink 6 or more in a total? never 0  Total Score:  0   A score of 3 or more in women, and 4 or more in men indicates increased risk for alcohol abuse, EXCEPT if all of the points are from question 1.    Assessment & Plan:     Annual Wellness Visit  Reviewed patient's Family Medical History Reviewed and updated list of patient's medical providers Assessment of cognitive impairment was done Assessed patient's functional ability Established a written schedule for health screening [place] Completed and Reviewed  Exercise Activities and Dietary recommendations Goals    None      Immunization History  Administered Date(s) Administered  . Influenza Split 12/22/2010  . Pneumococcal Polysaccharide-23 01/03/2008  . Td 05/06/2007    Health Maintenance  Topic Date Due  . ZOSTAVAX  09/03/2000  . DEXA SCAN  09/03/2005  . PNA vac Low Risk Adult (2 of 2 - PCV13) 01/02/2009  . INFLUENZA VACCINE  09/28/2014  . MAMMOGRAM  02/04/2016  . TETANUS/TDAP  05/05/2017  . COLONOSCOPY  10/18/2020      Discussed health benefits of physical activity, and encouraged her to engage in regular exercise appropriate for her age and condition.    ------------------------------------------------------------------------------------------------------------   1. Annual physical exam   2. History of adenomatous polyp of colon Scheduled for colonoscopy later this week.   3. Hyperlipidemia Tolerating QOD pravastatin and fenofibrate - Lipid Panel With LDL/HDL Ratio  4. Vitamin D deficiency  - Vitamin D (25 hydroxy)  5. Hypothyroidism, unspecified hypothyroidism type  - TSH

## 2014-12-28 ENCOUNTER — Other Ambulatory Visit: Payer: Self-pay | Admitting: *Deleted

## 2014-12-28 ENCOUNTER — Other Ambulatory Visit: Payer: Self-pay

## 2014-12-28 DIAGNOSIS — Z1231 Encounter for screening mammogram for malignant neoplasm of breast: Secondary | ICD-10-CM

## 2014-12-28 MED ORDER — LEVOTHYROXINE SODIUM 100 MCG PO TABS: 100.0000 ug | ORAL_TABLET | Freq: Every day | ORAL | Status: DC

## 2014-12-30 ENCOUNTER — Ambulatory Visit (INDEPENDENT_AMBULATORY_CARE_PROVIDER_SITE_OTHER): Payer: Medicare Other

## 2014-12-30 DIAGNOSIS — Z23 Encounter for immunization: Secondary | ICD-10-CM

## 2014-12-31 LAB — LIPID PANEL WITH LDL/HDL RATIO
CHOLESTEROL TOTAL: 219 mg/dL — AB (ref 100–199)
HDL: 65 mg/dL (ref >39)
LDL Calculated: 134 mg/dL — ABNORMAL HIGH (ref 0–99)
LDl/HDL Ratio: 2.1 ratio units (ref 0.0–3.2)
TRIGLYCERIDES: 100 mg/dL (ref 0–149)
VLDL Cholesterol Cal: 20 mg/dL (ref 5–40)

## 2014-12-31 LAB — TSH: TSH: 3.95 u[IU]/mL (ref 0.450–4.500)

## 2014-12-31 LAB — VITAMIN D 25 HYDROXY (VIT D DEFICIENCY, FRACTURES): Vit D, 25-Hydroxy: 53 ng/mL (ref 30.0–100.0)

## 2015-02-05 ENCOUNTER — Ambulatory Visit
Admission: RE | Admit: 2015-02-05 | Discharge: 2015-02-05 | Disposition: A | Discharge status: Home / Self Care | Payer: Medicare Other | Source: Ambulatory Visit

## 2015-02-05 DIAGNOSIS — Z1231 Encounter for screening mammogram for malignant neoplasm of breast: Secondary | ICD-10-CM

## 2015-03-04 ENCOUNTER — Other Ambulatory Visit: Payer: Self-pay | Admitting: Family Medicine

## 2015-03-04 DIAGNOSIS — Z0000 Encounter for general adult medical examination without abnormal findings: Secondary | ICD-10-CM

## 2015-03-04 NOTE — Telephone Encounter (Signed)
Patient states that she takes Synthroid 63mcg instead of 140mcg. She also would like brand name for both. Thanks!

## 2015-03-04 NOTE — Telephone Encounter (Signed)
Pt contacted office for refill request on the following medications:  Pt does not want to take the generic.  [address]  [id number]  estradiol (VIVELLE-DOT) 0.1 MG/24HR   SYNTHROID 100 MCG tablet.  Pt is requesting this changed to 50mg .

## 2015-03-05 MED ORDER — ESTRADIOL 0.1 MG/24HR TD PTTW: 1.0000 | MEDICATED_PATCH | TRANSDERMAL | Status: DC

## 2015-03-05 MED ORDER — SYNTHROID 50 MCG PO TABS: 50.0000 ug | ORAL_TABLET | Freq: Every day | ORAL | Status: DC

## 2015-03-17 ENCOUNTER — Telehealth: Payer: Self-pay | Admitting: Family Medicine

## 2015-03-17 NOTE — Telephone Encounter (Signed)
Please advise patient we received a prior authorization form regarding estradiole patch. We need to know how long she has been using it, what she is using it for, and what other medications she tried before starting the patch. They may not pay for the patch without this information.

## 2015-03-17 NOTE — Telephone Encounter (Addendum)
Patient stated that she had a hysterectomy 04/1986 and went into menopause immediately after. Patient was prescribed premarin but this made her sick (stomach) and also a hormone medication which she can not remember the name. Patient stated that she has tried to stop taking estradiol but she states she still has postmenopause symptoms.

## 2015-04-27 ENCOUNTER — Other Ambulatory Visit: Payer: Self-pay | Admitting: *Deleted

## 2015-04-27 MED ORDER — BUPROPION HCL ER (SR) 150 MG PO TB12: 150.0000 mg | ORAL_TABLET | Freq: Two times a day (BID) | ORAL | Status: DC

## 2015-05-03 ENCOUNTER — Telehealth: Payer: Self-pay | Admitting: Family Medicine

## 2015-05-03 NOTE — Telephone Encounter (Signed)
Please review. Thanks!  

## 2015-05-03 NOTE — Telephone Encounter (Signed)
Pt stated she joined [facility] to help her maintain her weight but she needs a note from Dr. Caryn Section stating her goal weight. Pt stated that she thinks her weight should be 140 which is what she is now and she would like her goal to be maintaining 140. Pt stated that she just had an OV but if she needs an OV for this to let her know. Please advise. Thanks TNP

## 2015-05-04 NOTE — Telephone Encounter (Signed)
Please advise 

## 2015-05-04 NOTE — Telephone Encounter (Signed)
Pt has called back and would like to have the note to take with her tomorrow morning at 9. When she goes to her meeting.  Could someone please call her when it is ready.  Her call back is [phone]  Thanks Con Memos

## 2015-06-22 DIAGNOSIS — L578 Other skin changes due to chronic exposure to nonionizing radiation: Secondary | ICD-10-CM | POA: Diagnosis not present

## 2015-06-22 DIAGNOSIS — L570 Actinic keratosis: Secondary | ICD-10-CM | POA: Diagnosis not present

## 2015-06-22 DIAGNOSIS — D485 Neoplasm of uncertain behavior of skin: Secondary | ICD-10-CM | POA: Diagnosis not present

## 2015-06-22 DIAGNOSIS — D489 Neoplasm of uncertain behavior, unspecified: Secondary | ICD-10-CM | POA: Diagnosis not present

## 2015-06-22 DIAGNOSIS — L821 Other seborrheic keratosis: Secondary | ICD-10-CM | POA: Diagnosis not present

## 2015-06-29 DIAGNOSIS — H2513 Age-related nuclear cataract, bilateral: Secondary | ICD-10-CM | POA: Diagnosis not present

## 2015-10-08 ENCOUNTER — Telehealth: Payer: Self-pay | Admitting: Family Medicine

## 2015-10-08 NOTE — Telephone Encounter (Signed)
Pt would like to change her goal weight to 135 pounds and would like  Dr. Caryn Section to write her goal on an RX paper so she can pick it up. Please advise. Thanks TNP

## 2015-10-12 NOTE — Telephone Encounter (Signed)
Patient advised as below.  

## 2015-10-12 NOTE — Telephone Encounter (Signed)
Is ready to pick up at front desk.

## 2015-10-12 NOTE — Telephone Encounter (Signed)
Pt called back wanting to know if this request had been done.  I looked up front and do not see where it has.   She would like to pick it up today.  Thank sTeri

## 2015-12-02 ENCOUNTER — Ambulatory Visit (INDEPENDENT_AMBULATORY_CARE_PROVIDER_SITE_OTHER): Payer: Medicare HMO

## 2015-12-02 DIAGNOSIS — Z23 Encounter for immunization: Secondary | ICD-10-CM | POA: Diagnosis not present

## 2015-12-27 DIAGNOSIS — D122 Benign neoplasm of ascending colon: Secondary | ICD-10-CM | POA: Diagnosis not present

## 2015-12-27 DIAGNOSIS — E785 Hyperlipidemia, unspecified: Secondary | ICD-10-CM | POA: Diagnosis not present

## 2015-12-27 DIAGNOSIS — D120 Benign neoplasm of cecum: Secondary | ICD-10-CM | POA: Diagnosis not present

## 2015-12-27 DIAGNOSIS — K5730 Diverticulosis of large intestine without perforation or abscess without bleeding: Secondary | ICD-10-CM | POA: Diagnosis not present

## 2015-12-27 DIAGNOSIS — Z888 Allergy status to other drugs, medicaments and biological substances status: Secondary | ICD-10-CM | POA: Diagnosis not present

## 2015-12-27 DIAGNOSIS — Z79899 Other long term (current) drug therapy: Secondary | ICD-10-CM | POA: Diagnosis not present

## 2015-12-27 DIAGNOSIS — E039 Hypothyroidism, unspecified: Secondary | ICD-10-CM | POA: Diagnosis not present

## 2015-12-27 DIAGNOSIS — M1990 Unspecified osteoarthritis, unspecified site: Secondary | ICD-10-CM | POA: Diagnosis not present

## 2015-12-27 DIAGNOSIS — K648 Other hemorrhoids: Secondary | ICD-10-CM | POA: Diagnosis not present

## 2015-12-27 DIAGNOSIS — D369 Benign neoplasm, unspecified site: Secondary | ICD-10-CM | POA: Diagnosis not present

## 2015-12-27 DIAGNOSIS — Z86010 Personal history of colonic polyps: Secondary | ICD-10-CM | POA: Diagnosis not present

## 2015-12-27 DIAGNOSIS — Z09 Encounter for follow-up examination after completed treatment for conditions other than malignant neoplasm: Secondary | ICD-10-CM | POA: Diagnosis not present

## 2015-12-27 DIAGNOSIS — Z1211 Encounter for screening for malignant neoplasm of colon: Secondary | ICD-10-CM | POA: Diagnosis not present

## 2016-01-03 ENCOUNTER — Other Ambulatory Visit: Payer: Self-pay | Admitting: Family Medicine

## 2016-01-03 DIAGNOSIS — Z1231 Encounter for screening mammogram for malignant neoplasm of breast: Secondary | ICD-10-CM

## 2016-01-07 ENCOUNTER — Other Ambulatory Visit: Payer: Self-pay | Admitting: Family Medicine

## 2016-01-12 ENCOUNTER — Encounter: Payer: Medicare HMO | Admitting: Family Medicine

## 2016-01-12 DIAGNOSIS — R69 Illness, unspecified: Secondary | ICD-10-CM | POA: Diagnosis not present

## 2016-02-07 ENCOUNTER — Ambulatory Visit
Admission: RE | Admit: 2016-02-07 | Discharge: 2016-02-07 | Disposition: A | Discharge status: Home / Self Care | Payer: Medicare HMO | Source: Ambulatory Visit | Attending: Family Medicine | Admitting: Family Medicine

## 2016-02-07 DIAGNOSIS — Z1231 Encounter for screening mammogram for malignant neoplasm of breast: Secondary | ICD-10-CM

## 2016-02-10 ENCOUNTER — Ambulatory Visit (INDEPENDENT_AMBULATORY_CARE_PROVIDER_SITE_OTHER): Payer: Medicare HMO | Admitting: Family Medicine

## 2016-02-10 ENCOUNTER — Encounter: Payer: Self-pay | Admitting: Family Medicine

## 2016-02-10 VITALS — BP 130/76 | HR 73 | Temp 97.8°F | Resp 16 | Ht 62.0 in | Wt 130.0 lb

## 2016-02-10 DIAGNOSIS — E039 Hypothyroidism, unspecified: Secondary | ICD-10-CM

## 2016-02-10 DIAGNOSIS — Z0000 Encounter for general adult medical examination without abnormal findings: Secondary | ICD-10-CM

## 2016-02-10 DIAGNOSIS — E559 Vitamin D deficiency, unspecified: Secondary | ICD-10-CM

## 2016-02-10 DIAGNOSIS — E785 Hyperlipidemia, unspecified: Secondary | ICD-10-CM | POA: Diagnosis not present

## 2016-02-10 DIAGNOSIS — E2839 Other primary ovarian failure: Secondary | ICD-10-CM | POA: Diagnosis not present

## 2016-02-10 DIAGNOSIS — Z86010 Personal history of colonic polyps: Secondary | ICD-10-CM

## 2016-02-10 NOTE — Progress Notes (Signed)
Patient: Laura Love, Female    DOB: 06-10-1940, 75 y.o.   MRN: [id number] Visit Date: 02/10/2016  Today's Provider: [name], MD   Chief Complaint  Patient presents with  . Annual Exam  . Hyperlipidemia    follow up  . Hypothyroidism    follow up   Subjective:    Annual physical Laura Love is a 75 y.o. female. She feels fairly well. She reports exercising daily. She reports she is sleeping well.  She has been on a healthy diet eating small frequent meals for the last year and has stopped taking atorvastatin and fenofebrate. States she feels much better with no complaints today.   Wt Readings from Last 3 Encounters:  02/10/16 130 lb (59 kg)  12/21/14 148 lb (67.1 kg)  11/23/10 146 lb (66.2 kg)     -----------------------------------------------------------  Lipid/Cholesterol, Follow-up:   Last seen for this1 years ago.  Management changes since that visit include none. . Last Lipid Panel:    Component Value Date/Time   CHOL 219 (H) 12/30/2014 0845   TRIG 100 12/30/2014 0845   HDL 65 12/30/2014 0845   CHOLHDL 4 10/24/2010 1034   VLDL 25.4 10/24/2010 1034   LDLCALC 134 (H) 12/30/2014 0845   LDLDIRECT 159.3 10/24/2010 1034    Risk factors for vascular disease include hypercholesterolemia  She reports good compliance with treatment. She is not having side effects.  Current symptoms include none and have been stable. Weight trend: decreasing steadily Prior visit with dietician: no Current diet: in general, a "healthy" diet   Current exercise: cardiovascular workout on exercise equipment, walking and weightlifting  Wt Readings from Last 3 Encounters:  12/21/14 148 lb (67.1 kg)  11/23/10 146 lb (66.2 kg)  11/11/10 150 lb 12 oz (68.4 kg)    ------------------------------------------------------------------- Follow up Hypothyroidism: Patient was last seen 1 year ago for this problem and no changes were made. Patient reports good  compliance with treatment and good tolerance. She is currently taking 1/2 of 100mg  brand name synthroid per day, (and given other 1/2 to her husband because brand name is too expensive to prescribe for both of them) Lab Results  Component Value Date   TSH 3.950 12/30/2014    Follow up Vitamin D Deficiency: Patient was last seen 1 year ago for this problem and no changes were made.  Lab Results  Component Value Date   VD25OH 53.0 12/30/2014   Was noted to be osteopenic on last BMD in 2013.   Review of Systems  Constitutional: Negative for chills, fatigue and fever.  HENT: Negative for congestion, ear pain, rhinorrhea, sneezing and sore throat.   Eyes: Negative.  Negative for pain and redness.  Respiratory: Negative for cough, shortness of breath and wheezing.   Cardiovascular: Negative for chest pain and leg swelling.  Gastrointestinal: Positive for constipation. Negative for abdominal pain, blood in stool, diarrhea and nausea.  Endocrine: Negative for polydipsia and polyphagia.  Genitourinary: Negative.  Negative for dysuria, flank pain, hematuria, pelvic pain, vaginal bleeding and vaginal discharge.  Musculoskeletal: Negative for arthralgias, back pain, gait problem and joint swelling.  Skin: Negative for rash.  Neurological: Negative.  Negative for dizziness, tremors, seizures, weakness, light-headedness, numbness and headaches.  Hematological: Negative for adenopathy.  Psychiatric/Behavioral: Negative.  Negative for behavioral problems, confusion and dysphoric mood. The patient is not nervous/anxious and is not hyperactive.     Social History   Social History  . Marital status: Married  Spouse name: N/A  . Number of children: 2  . Years of education: N/A   Occupational History  . Retired    Social History Main Topics  . Smoking status: Former Research scientist (life sciences)  . Smokeless tobacco: Never Used  . Alcohol use No  . Drug use: No  . Sexual activity: Not on file   Other Topics  Concern  . Not on file   Social History Narrative   Lives with husband in [place], [place]. Raised horses.    Past Medical History:  Diagnosis Date  . Bladder incontinence   . Clavicular fracture   . Colon polyps   . Cystocele   . History of chicken pox   . History of measles   . History of mumps   . Hyperlipemia   . Hypoglycemia   . Vaginal prolapse    currently using pessary     Patient Active Problem List   Diagnosis Date Noted  . History of adenomatous polyp of colon 12/21/2014  . Hypothyroidism 11/08/2010  . Hyperlipemia 10/19/2010  . Bladder incontinence 10/19/2010  . Vaginal prolapse 10/19/2010    Past Surgical History:  Procedure Laterality Date  . ABDOMINAL HYSTERECTOMY  1988   for endometriosis  . CLOSED REDUCTION FACIAL FRACTURE      Her family history includes Cancer in her mother and sister; Heart attack in her father; Hyperlipidemia in her brother, mother, and sister; Hypertension in her mother; Leukemia in her father.      Current Outpatient Prescriptions:  .  Biotin 1000 MCG tablet, Take 1,000 mcg by mouth daily.  , Disp: , Rfl:  .  buPROPion (WELLBUTRIN SR) 150 MG 12 hr tablet, Take 1 tablet (150 mg total) by mouth 2 (two) times daily., Disp: 60 tablet, Rfl: 12 .  ibuprofen (ADVIL,MOTRIN) 200 MG tablet, Take 200 mg by mouth every 6 (six) hours as needed. 2 tablets every 3 days , Disp: , Rfl:  .  Melatonin 3 MG TABS, Take 1 tablet by mouth at bedtime., Disp: , Rfl:  .  sennosides-docusate sodium (SENOKOT-S) 8.6-50 MG tablet, Take 2-3 tablets by mouth 2 (two) times daily. 3 tablets every morning, then 3-4 tablets in the evenings, Disp: , Rfl:  .  SYNTHROID 50 MCG tablet, Take 1 tablet (50 mcg total) by mouth daily before breakfast. Brand name medically necessary, Disp: 90 tablet, Rfl: 4 .  vitamin C (ASCORBIC ACID) 500 MG tablet, Take 500 mg by mouth daily.  , Disp: , Rfl:  .  Vitamin D, Cholecalciferol, 1000 UNITS TABS, Take 2 tablets by mouth daily.,  Disp: , Rfl:  .  zinc gluconate 50 MG tablet, Take 50 mg by mouth daily as needed.  , Disp: , Rfl:  .  SYNTHROID 100 MCG tablet, TAKE ONE TABLET BY MOUTH ONCE DAILY (Patient not taking: Reported on 02/10/2016), Disp: 30 tablet, Rfl: 12  Patient Care Team: Birdie Sons, MD as PCP - General (Family Medicine)     Objective:   Vitals: BP 130/76 (BP Location: Left Arm, Patient Position: Sitting, Cuff Size: Normal)   Pulse 73   Temp 97.8 F (36.6 C) (Oral)   Resp 16   Ht 5\' 2"  (1.575 m)   Wt 130 lb (59 kg)   SpO2 99% Comment: room air  BMI 23.78 kg/m   Physical Exam   General Appearance:    Alert, cooperative, no distress, appears stated age  Head:    Normocephalic, without obvious abnormality, atraumatic  Eyes:    PERRL,  conjunctiva/corneas clear, EOM's intact, fundi    benign, both eyes  Ears:    Normal TM's and external ear canals, both ears  Nose:   Nares normal, septum midline, mucosa normal, no drainage    or sinus tenderness  Throat:   Lips, mucosa, and tongue normal; teeth and gums normal  Neck:   Supple, symmetrical, trachea midline, no adenopathy;    thyroid:  no enlargement/tenderness/nodules; no carotid   bruit or JVD  Back:     Symmetric, no curvature, ROM normal, no CVA tenderness  Lungs:     Clear to auscultation bilaterally, respirations unlabored  Chest Wall:    No tenderness or deformity   Heart:    Regular rate and rhythm, S1 and S2 normal, no murmur, rub   or gallop  Breast Exam:    patient declined exam  Abdomen:     Soft, non-tender, bowel sounds active all four quadrants,    no masses, no organomegaly  Pelvic:    deferred  Extremities:   Extremities normal, atraumatic, no cyanosis or edema  Pulses:   2+ and symmetric all extremities  Skin:   Skin color, texture, turgor normal, no rashes or lesions  Lymph nodes:   Cervical, supraclavicular, and axillary nodes normal  Neurologic:   CNII-XII intact, normal strength, sensation and reflexes     throughout     Activities of Daily Living In your present state of health, do you have any difficulty performing the following activities: 02/10/2016  Hearing? N  Vision? N  Difficulty concentrating or making decisions? N  Walking or climbing stairs? N  Dressing or bathing? N  Doing errands, shopping? N  Some recent data might be hidden    Fall Risk Assessment Fall Risk  02/10/2016 12/21/2014  Falls in the past year? No No     Depression Screen PHQ 2/9 Scores 02/10/2016 02/10/2016 12/21/2014  PHQ - 2 Score 0 0 0  PHQ- 9 Score 0 - -    Cognitive Testing - 6-CIT- (Patient declined Cognitive functioning testing) Patient refused  Audit-C Alcohol Use Screening  Question Answer Points  How often do you have alcoholic drink? never 0  On days you do drink alcohol, how many drinks do you typically consume? n/a 0  How oftey will you drink 6 or more in a total? never 0  Total Score:  0   A score of 3 or more in women, and 4 or more in men indicates increased risk for alcohol abuse, EXCEPT if all of the points are from question 1.  Current Exercise Habits: Home exercise routine, Type of exercise: treadmill;walking;strength training/weights, Time (Minutes): 30, Frequency (Times/Week): 7, Weekly Exercise (Minutes/Week): 210, Intensity: Moderate Exercise limited by: None identified   Assessment & Plan:     Annual Physical Reviewed patient's Family Medical History Reviewed and updated list of patient's medical providers Assessment of cognitive impairment was done Assessed patient's functional ability Established a written schedule for health screening [place] Completed and Reviewed  Exercise Activities and Dietary recommendations Goals    None      Immunization History  Administered Date(s) Administered  . Influenza Split 12/22/2010  . Influenza, High Dose Seasonal PF 12/30/2014, 12/02/2015  . Pneumococcal Conjugate-13 12/02/2015  . Pneumococcal  Polysaccharide-23 01/03/2008  . Td 05/06/2007  . Tdap 11/17/2011    Health Maintenance  Topic Date Due  . ZOSTAVAX  09/03/2000  . COLONOSCOPY  12/26/2016  . TETANUS/TDAP  11/16/2021  . INFLUENZA VACCINE  Completed  .  DEXA SCAN  Completed  . PNA vac Low Risk Adult  Completed     Discussed health benefits of physical activity, and encouraged her to engage in regular exercise appropriate for her age and condition.    ------------------------------------------------------------------------------------------------------------ 1. Annual physical exam Doing well without complaints. Off cholesterol meds since being on natural diet. She states she has already had Zostavax. Consider Shingrix next year.   2. Hypothyroidism, unspecified type Doing well, on 1/2 160mcg brand name Synthroid - TSH  3. Hyperlipidemia, unspecified hyperlipidemia type Currently off of statin and fenofibrate - Lipid panel - Comprehensive metabolic panel  4. History of adenomatous polyp of colon Continue follow up at [facility]  5. Estrogen deficiency  - DG Bone Density; Future  6. Vitamin D deficiency  - VITAMIN D 25 Hydroxy (Vit-D Deficiency, Fractures)   The entirety of the information documented in the History of Present Illness, Review of Systems and Physical Exam were personally obtained by me. Portions of this information were initially documented by Meyer Cory, CMA and reviewed by me for thoroughness and accuracy.     [name], MD  [facility]

## 2016-02-10 NOTE — Patient Instructions (Signed)
Preventive Care 65 Years and Older, Female Preventive care refers to lifestyle choices and visits with your health care provider that can promote health and wellness. What does preventive care include?  A yearly physical exam. This is also called an annual well check.  Dental exams once or twice a year.  Routine eye exams. Ask your health care provider how often you should have your eyes checked.  Personal lifestyle choices, including:  Daily care of your teeth and gums.  Regular physical activity.  Eating a healthy diet.  Avoiding tobacco and drug use.  Limiting alcohol use.  Practicing safe sex.  Taking low-dose aspirin every day.  Taking vitamin and mineral supplements as recommended by your health care provider. What happens during an annual well check? The services and screenings done by your health care provider during your annual well check will depend on your age, overall health, lifestyle risk factors, and family history of disease. Counseling  Your health care provider may ask you questions about your:  Alcohol use.  Tobacco use.  Drug use.  Emotional well-being.  Home and relationship well-being.  Sexual activity.  Eating habits.  History of falls.  Memory and ability to understand (cognition).  Work and work environment.  Reproductive health. Screening  You may have the following tests or measurements:  Height, weight, and BMI.  Blood pressure.  Lipid and cholesterol levels. These may be checked every 5 years, or more frequently if you are over 50 years old.  Skin check.  Lung cancer screening. You may have this screening every year starting at age 55 if you have a 30-pack-year history of smoking and currently smoke or have quit within the past 15 years.  Fecal occult blood test (FOBT) of the stool. You may have this test every year starting at age 50.  Flexible sigmoidoscopy or colonoscopy. You may have a sigmoidoscopy every 5 years or  a colonoscopy every 10 years starting at age 50.  Hepatitis C blood test.  Hepatitis B blood test.  Sexually transmitted disease (STD) testing.  Diabetes screening. This is done by checking your blood sugar (glucose) after you have not eaten for a while (fasting). You may have this done every 1-3 years.  Bone density scan. This is done to screen for osteoporosis. You may have this done starting at age 65.  Mammogram. This may be done every 1-2 years. Talk to your health care provider about how often you should have regular mammograms. Talk with your health care provider about your test results, treatment options, and if necessary, the need for more tests. Vaccines  Your health care provider may recommend certain vaccines, such as:  Influenza vaccine. This is recommended every year.  Tetanus, diphtheria, and acellular pertussis (Tdap, Td) vaccine. You may need a Td booster every 10 years.  Varicella vaccine. You may need this if you have not been vaccinated.  Zoster vaccine. You may need this after age 60.  Measles, mumps, and rubella (MMR) vaccine. You may need at least one dose of MMR if you were born in 1957 or later. You may also need a second dose.  Pneumococcal 13-valent conjugate (PCV13) vaccine. One dose is recommended after age 65.  Pneumococcal polysaccharide (PPSV23) vaccine. One dose is recommended after age 65.  Meningococcal vaccine. You may need this if you have certain conditions.  Hepatitis A vaccine. You may need this if you have certain conditions or if you travel or work in places where you may be exposed to   hepatitis A.  Hepatitis B vaccine. You may need this if you have certain conditions or if you travel or work in places where you may be exposed to hepatitis B.  Haemophilus influenzae type b (Hib) vaccine. You may need this if you have certain conditions. Talk to your health care provider about which screenings and vaccines you need and how often you need  them. This information is not intended to replace advice given to you by your health care provider. Make sure you discuss any questions you have with your health care provider. Document Released: 03/12/2015 Document Revised: 11/03/2015 Document Reviewed: 12/15/2014 Elsevier Interactive Patient Education  2017 Elsevier Inc.  

## 2016-02-11 ENCOUNTER — Other Ambulatory Visit: Payer: Self-pay | Admitting: Family Medicine

## 2016-02-11 DIAGNOSIS — E785 Hyperlipidemia, unspecified: Secondary | ICD-10-CM

## 2016-02-11 LAB — COMPREHENSIVE METABOLIC PANEL
ALBUMIN: 4.3 g/dL (ref 3.5–4.8)
ALK PHOS: 77 IU/L (ref 39–117)
ALT: 11 IU/L (ref 0–32)
AST: 13 IU/L (ref 0–40)
Albumin/Globulin Ratio: 1.9 (ref 1.2–2.2)
BUN / CREAT RATIO: 11 — AB (ref 12–28)
BUN: 10 mg/dL (ref 8–27)
Bilirubin Total: 0.4 mg/dL (ref 0.0–1.2)
CO2: 25 mmol/L (ref 18–29)
CREATININE: 0.87 mg/dL (ref 0.57–1.00)
Calcium: 9.2 mg/dL (ref 8.7–10.3)
Chloride: 98 mmol/L (ref 96–106)
GFR, EST AFRICAN AMERICAN: 75 mL/min/{1.73_m2} (ref >59)
GFR, EST NON AFRICAN AMERICAN: 65 mL/min/{1.73_m2} (ref >59)
GLOBULIN, TOTAL: 2.3 g/dL (ref 1.5–4.5)
GLUCOSE: 95 mg/dL (ref 65–99)
Potassium: 4.5 mmol/L (ref 3.5–5.2)
SODIUM: 138 mmol/L (ref 134–144)
TOTAL PROTEIN: 6.6 g/dL (ref 6.0–8.5)

## 2016-02-11 LAB — VITAMIN D 25 HYDROXY (VIT D DEFICIENCY, FRACTURES): Vit D, 25-Hydroxy: 42.8 ng/mL (ref 30.0–100.0)

## 2016-02-11 LAB — LIPID PANEL
CHOL/HDL RATIO: 3.4 ratio (ref 0.0–4.4)
CHOLESTEROL TOTAL: 264 mg/dL — AB (ref 100–199)
HDL: 78 mg/dL (ref >39)
LDL CALC: 166 mg/dL — AB (ref 0–99)
TRIGLYCERIDES: 101 mg/dL (ref 0–149)
VLDL Cholesterol Cal: 20 mg/dL (ref 5–40)

## 2016-02-11 LAB — TSH: TSH: 1.83 u[IU]/mL (ref 0.450–4.500)

## 2016-02-11 MED ORDER — EZETIMIBE 10 MG PO TABS: 10.0000 mg | ORAL_TABLET | Freq: Every day | ORAL | 1 refills | Status: DC

## 2016-04-04 ENCOUNTER — Telehealth: Payer: Self-pay
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
## 2016-04-07 ENCOUNTER — Ambulatory Visit (INDEPENDENT_AMBULATORY_CARE_PROVIDER_SITE_OTHER): Payer: Medicare HMO | Admitting: Family Medicine

## 2016-04-07 ENCOUNTER — Encounter: Payer: Self-pay | Admitting: Family Medicine

## 2016-04-07 VITALS — BP 126/66 | HR 80 | Temp 98.0°F | Resp 20 | Wt 135.0 lb

## 2016-04-07 DIAGNOSIS — R42 Dizziness and giddiness: Secondary | ICD-10-CM | POA: Diagnosis not present

## 2016-04-07 DIAGNOSIS — R05 Cough: Secondary | ICD-10-CM

## 2016-04-07 DIAGNOSIS — X088XXA Exposure to other specified smoke, fire and flames, initial encounter: Secondary | ICD-10-CM

## 2016-04-07 DIAGNOSIS — R059 Cough, unspecified: Secondary | ICD-10-CM

## 2016-04-07 MED ORDER — BUDESONIDE-FORMOTEROL FUMARATE 160-4.5 MCG/ACT IN AERO: INHALATION_SPRAY | RESPIRATORY_TRACT | 0 refills | Status: DC

## 2016-04-07 MED ORDER — MECLIZINE HCL 25 MG PO TABS
25.0000 mg | ORAL_TABLET | Freq: Three times a day (TID) | ORAL | 0 refills | Status: DC | PRN
Start: 2016-04-07 — End: 2020-11-24
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
## 2016-04-27 ENCOUNTER — Other Ambulatory Visit: Payer: Self-pay | Admitting: Family Medicine

## 2016-05-25 ENCOUNTER — Other Ambulatory Visit: Payer: Self-pay | Admitting: Family Medicine

## 2016-05-25 DIAGNOSIS — X088XXA Exposure to other specified smoke, fire and flames, initial encounter: Secondary | ICD-10-CM

## 2016-05-25 MED ORDER — BUDESONIDE-FORMOTEROL FUMARATE 160-4.5 MCG/ACT IN AERO: INHALATION_SPRAY | RESPIRATORY_TRACT | 0 refills | Status: DC

## 2016-05-25 NOTE — Telephone Encounter (Signed)
I think this is supposed to be for Symbicort 160-50. Can send in prescription for 1 puff twice a day, #1 inhaler, no refills.

## 2016-05-25 NOTE — Telephone Encounter (Signed)
Pt is requesting another sample of the Rx SYNTHROID 50 MCG tablet or a Rx for this due to still coughing.  [place].  BV#[phone]

## 2016-05-25 NOTE — Telephone Encounter (Signed)
Please advise 

## 2016-05-25 NOTE — Telephone Encounter (Signed)
Patient would rather have a sample. Sample placed up front for pick up.

## 2016-06-09 ENCOUNTER — Ambulatory Visit (INDEPENDENT_AMBULATORY_CARE_PROVIDER_SITE_OTHER): Payer: Medicare HMO | Admitting: Family Medicine

## 2016-06-09 ENCOUNTER — Encounter: Payer: Self-pay | Admitting: Family Medicine

## 2016-06-09 VITALS — BP 118/74 | HR 72 | Temp 98.8°F | Resp 16 | Wt 132.0 lb

## 2016-06-09 DIAGNOSIS — K5900 Constipation, unspecified: Secondary | ICD-10-CM | POA: Diagnosis not present

## 2016-06-09 NOTE — Patient Instructions (Addendum)
   Try taking 1 tablespoon of metamucil mixed with liquid every evening before bed. Call for laxative prescription if not better in about a week.    Constipation, Adult Constipation is when a person:  Poops (has a bowel movement) fewer times in a week than normal.  Has a hard time pooping.  Has poop that is dry, hard, or bigger than normal. Follow these instructions at home: Eating and drinking    Eat foods that have a lot of fiber, such as:  Fresh fruits and vegetables.  Whole grains.  Beans.  Eat less of foods that are high in fat, low in fiber, or overly processed, such as:  [place] fries.  Hamburgers.  Cookies.  Candy.  Soda.  Drink enough fluid to keep your pee (urine) clear or pale yellow. General instructions   Exercise regularly or as told by your doctor.  Go to the restroom when you feel like you need to poop. Do not hold it in.  Take over-the-counter and prescription medicines only as told by your doctor. These include any fiber supplements.  Do pelvic floor retraining exercises, such as:  Doing deep breathing while relaxing your lower belly (abdomen).  Relaxing your pelvic floor while pooping.  Watch your condition for any changes.  Keep all follow-up visits as told by your doctor. This is important. Contact a doctor if:  You have pain that gets worse.  You have a fever.  You have not pooped for 4 days.  You throw up (vomit).  You are not hungry.  You lose weight.  You are bleeding from the anus.  You have thin, pencil-like poop (stool). Get help right away if:  You have a fever, and your symptoms suddenly get worse.  You leak poop or have blood in your poop.  Your belly feels hard or bigger than normal (is bloated).  You have very bad belly pain.  You feel dizzy or you faint. This information is not intended to replace advice given to you by your health care provider. Make sure you discuss any questions you have with your  health care provider. Document Released: 08/02/2007 Document Revised: 09/03/2015 Document Reviewed: 08/04/2015 Elsevier Interactive Patient Education  2017 [employer].
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
## 2016-07-04 DIAGNOSIS — H2513 Age-related nuclear cataract, bilateral: Secondary | ICD-10-CM | POA: Diagnosis not present

## 2016-08-16 DIAGNOSIS — R69 Illness, unspecified: Secondary | ICD-10-CM | POA: Diagnosis not present

## 2016-09-13 ENCOUNTER — Other Ambulatory Visit: Payer: Self-pay | Admitting: Family Medicine

## 2016-09-13 DIAGNOSIS — X088XXA Exposure to other specified smoke, fire and flames, initial encounter: Secondary | ICD-10-CM

## 2016-09-13 MED ORDER — BUDESONIDE-FORMOTEROL FUMARATE 160-4.5 MCG/ACT IN AERO: INHALATION_SPRAY | RESPIRATORY_TRACT | 0 refills | Status: DC

## 2016-09-14 ENCOUNTER — Other Ambulatory Visit: Payer: Self-pay | Admitting: Family Medicine

## 2016-09-14 DIAGNOSIS — X088XXA Exposure to other specified smoke, fire and flames, initial encounter: Secondary | ICD-10-CM

## 2016-09-14 MED ORDER — BUDESONIDE-FORMOTEROL FUMARATE 160-4.5 MCG/ACT IN AERO: INHALATION_SPRAY | RESPIRATORY_TRACT | 3 refills | Status: DC

## 2016-09-14 NOTE — Telephone Encounter (Signed)
Pt is requesting an Rx for budesonide-formoterol (SYMBICORT) 160-4.5 MCG/ACT inhaler since we don't have any samples in the office. Pt is requesting it to be sent to [facility]. Please advise. Thanks TNP

## 2016-09-18 ENCOUNTER — Telehealth: Payer: Self-pay | Admitting: Family Medicine

## 2016-09-18 NOTE — Telephone Encounter (Signed)
Pt called to make sure her pharmacy was changed to [facility] in [place]

## 2016-09-18 NOTE — Telephone Encounter (Signed)
Noted  

## 2016-09-21 ENCOUNTER — Ambulatory Visit (INDEPENDENT_AMBULATORY_CARE_PROVIDER_SITE_OTHER): Payer: Medicare HMO | Admitting: Physician Assistant

## 2016-09-21 ENCOUNTER — Encounter: Payer: Self-pay | Admitting: Physician Assistant

## 2016-09-21 VITALS — BP 128/72 | HR 72 | Temp 97.5°F | Resp 16 | Wt 135.0 lb

## 2016-09-21 DIAGNOSIS — X088XXS Exposure to other specified smoke, fire and flames, sequela: Secondary | ICD-10-CM | POA: Diagnosis not present

## 2016-09-21 DIAGNOSIS — R059 Cough, unspecified: Secondary | ICD-10-CM

## 2016-09-21 DIAGNOSIS — R05 Cough: Secondary | ICD-10-CM

## 2016-09-21 DIAGNOSIS — R062 Wheezing: Secondary | ICD-10-CM | POA: Diagnosis not present

## 2016-09-21 MED ORDER — BENZONATATE 100 MG PO CAPS: 100.0000 mg | ORAL_CAPSULE | Freq: Three times a day (TID) | ORAL | 0 refills | Status: AC | PRN

## 2016-09-21 MED ORDER — ALBUTEROL SULFATE HFA 108 (90 BASE) MCG/ACT IN AERS: 2.0000 | INHALATION_SPRAY | Freq: Four times a day (QID) | RESPIRATORY_TRACT | 2 refills | Status: DC | PRN

## 2016-09-21 MED ORDER — BUDESONIDE-FORMOTEROL FUMARATE 160-4.5 MCG/ACT IN AERO: INHALATION_SPRAY | RESPIRATORY_TRACT | 12 refills | Status: DC
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
## 2016-09-27 DIAGNOSIS — D2261 Melanocytic nevi of right upper limb, including shoulder: Secondary | ICD-10-CM | POA: Diagnosis not present

## 2016-09-27 DIAGNOSIS — D225 Melanocytic nevi of trunk: Secondary | ICD-10-CM | POA: Diagnosis not present

## 2016-09-27 DIAGNOSIS — L814 Other melanin hyperpigmentation: Secondary | ICD-10-CM | POA: Diagnosis not present

## 2016-09-27 DIAGNOSIS — D2272 Melanocytic nevi of left lower limb, including hip: Secondary | ICD-10-CM | POA: Diagnosis not present

## 2016-10-19 DIAGNOSIS — H16101 Unspecified superficial keratitis, right eye: Secondary | ICD-10-CM | POA: Diagnosis not present

## 2016-10-25 ENCOUNTER — Ambulatory Visit (INDEPENDENT_AMBULATORY_CARE_PROVIDER_SITE_OTHER): Payer: Medicare HMO | Admitting: Family Medicine

## 2016-10-25 DIAGNOSIS — Z23 Encounter for immunization: Secondary | ICD-10-CM | POA: Diagnosis not present

## 2016-11-17 ENCOUNTER — Telehealth: Payer: Self-pay | Admitting: Family Medicine

## 2016-11-17 DIAGNOSIS — R0602 Shortness of breath: Secondary | ICD-10-CM

## 2016-11-17 NOTE — Telephone Encounter (Signed)
Pt is requesting a referral to see a specialist/Ann Mathew at [facility].  Phone [phone] and Fax [phone] due to breathing issues.  FH#[phone]/[phone]

## 2016-11-17 NOTE — Telephone Encounter (Signed)
Please review-Anastasiya V Hopkins, RMA  

## 2016-11-17 NOTE — Telephone Encounter (Signed)
Please refer as requested below.

## 2016-11-20 ENCOUNTER — Telehealth: Payer: Self-pay | Admitting: Family Medicine

## 2016-11-20 NOTE — Telephone Encounter (Signed)
Muscle cramps can be an uncommon side effect of inhalers. She can put in on hold for 5-6 days to see if it improves.

## 2016-11-20 NOTE — Telephone Encounter (Signed)
Advised patient as below.  

## 2016-11-20 NOTE — Telephone Encounter (Signed)
Pt states that she has been having bilateral leg pain and cramping since she started taking symbicort.It has been keeping her awake at night She wants to know if this may be a side effect from the drug

## 2016-11-20 NOTE — Telephone Encounter (Signed)
Please review. Thanks!  

## 2016-12-15 DIAGNOSIS — R0602 Shortness of breath: Secondary | ICD-10-CM | POA: Diagnosis not present

## 2016-12-15 DIAGNOSIS — R0989 Other specified symptoms and signs involving the circulatory and respiratory systems: Secondary | ICD-10-CM | POA: Diagnosis not present

## 2016-12-15 DIAGNOSIS — J705 Respiratory conditions due to smoke inhalation: Secondary | ICD-10-CM | POA: Diagnosis not present

## 2016-12-27 ENCOUNTER — Ambulatory Visit (INDEPENDENT_AMBULATORY_CARE_PROVIDER_SITE_OTHER): Payer: Medicare HMO | Admitting: Family Medicine

## 2016-12-27 ENCOUNTER — Encounter: Payer: Self-pay | Admitting: Family Medicine

## 2016-12-27 VITALS — BP 128/62 | Temp 98.0°F | Resp 16 | Wt 138.0 lb

## 2016-12-27 DIAGNOSIS — E785 Hyperlipidemia, unspecified: Secondary | ICD-10-CM

## 2016-12-27 DIAGNOSIS — E039 Hypothyroidism, unspecified: Secondary | ICD-10-CM | POA: Diagnosis not present

## 2016-12-27 DIAGNOSIS — E2839 Other primary ovarian failure: Secondary | ICD-10-CM

## 2016-12-27 DIAGNOSIS — M109 Gout, unspecified: Secondary | ICD-10-CM

## 2016-12-27 LAB — LIPID PANEL
CHOLESTEROL: 287 mg/dL — AB (ref <200)
HDL: 81 mg/dL (ref >50)
LDL Cholesterol (Calc): 180 mg/dL (calc) — ABNORMAL HIGH
Non-HDL Cholesterol (Calc): 206 mg/dL (calc) — ABNORMAL HIGH (ref <130)
Total CHOL/HDL Ratio: 3.5 (calc) (ref <5.0)
Triglycerides: 128 mg/dL (ref <150)

## 2016-12-27 LAB — TSH: TSH: 1.77 mIU/L (ref 0.40–4.50)

## 2016-12-27 LAB — URIC ACID: Uric Acid, Serum: 4.5 mg/dL (ref 2.5–7.0)

## 2016-12-27 NOTE — Progress Notes (Signed)
Patient: Laura Love Female    DOB: 1940-09-08   76 y.o.   MRN: [id number] Visit Date: 12/27/2016  Today's Provider: [name], MD   Chief Complaint  Patient presents with  . Foot Pain   Subjective:    HPI Patient comes in today c/o pain in her left foot. She reports that she has had symptoms for about 2 weeks. Patient reports that the pain has been constant. The pain is located in her great toe. She feels that it could possibly be gout. She states it was red and swollen at dorsal aspect left MTP. She put OTC analgesic cream on the area for 3 days and that it has now resolved. She is wondering if she needs to take medications for uric acid. She has no previous history of gout. No recent dietary changes or medication changes.  She would also like follow up of cholesterol. Is tolerating ezetimibe well without adverse effects.  Lab Results  Component Value Date   CHOL 264 (H) 02/10/2016   HDL 78 02/10/2016   LDLCALC 166 (H) 02/10/2016   LDLDIRECT 159.3 10/24/2010   TRIG 101 02/10/2016   CHOLHDL 3.4 02/10/2016   She would also like thyroid functions checked.  Lab Results  Component Value Date   TSH 1.830 02/10/2016   Feels well on current dose of levothyroxine.     Allergies  Allergen Reactions  . Crestor [Rosuvastatin Calcium]      Current Outpatient Prescriptions:  .  albuterol (PROVENTIL HFA;VENTOLIN HFA) 108 (90 Base) MCG/ACT inhaler, Inhale 2 puffs into the lungs every 6 (six) hours as needed for wheezing or shortness of breath., Disp: 1 Inhaler, Rfl: 2 .  Biotin 1000 MCG tablet, Take 1,000 mcg by mouth daily.  , Disp: , Rfl:  .  budesonide-formoterol (SYMBICORT) 160-4.5 MCG/ACT inhaler, 1-2 puffs twice daily, Disp: 1 Inhaler, Rfl: 12 .  buPROPion (WELLBUTRIN SR) 150 MG 12 hr tablet, TAKE ONE TABLET BY MOUTH TWICE DAILY, Disp: 60 tablet, Rfl: 12 .  ezetimibe (ZETIA) 10 MG tablet, Take 1 tablet (10 mg total) by mouth daily., Disp: 90 tablet, Rfl: 1 .   ibuprofen (ADVIL,MOTRIN) 200 MG tablet, Take 200 mg by mouth every 6 (six) hours as needed. 2 tablets every 3 days , Disp: , Rfl:  .  meclizine (ANTIVERT) 25 MG tablet, Take 1 tablet (25 mg total) by mouth 3 (three) times daily as needed for dizziness., Disp: 30 tablet, Rfl: 0 .  Melatonin 3 MG TABS, Take 1 tablet by mouth at bedtime., Disp: , Rfl:  .  sennosides-docusate sodium (SENOKOT-S) 8.6-50 MG tablet, Take 2-3 tablets by mouth 2 (two) times daily. 3 tablets every morning, then 3-4 tablets in the evenings, Disp: , Rfl:  .  SYNTHROID 100 MCG tablet, TAKE ONE TABLET BY MOUTH ONCE DAILY, Disp: 30 tablet, Rfl: 12 .  vitamin C (ASCORBIC ACID) 500 MG tablet, Take 500 mg by mouth daily.  , Disp: , Rfl:  .  Vitamin D, Cholecalciferol, 1000 UNITS TABS, Take 2 tablets by mouth daily., Disp: , Rfl:  .  zinc gluconate 50 MG tablet, Take 50 mg by mouth daily as needed.  , Disp: , Rfl:   Review of Systems  Constitutional: Negative for appetite change, chills, fatigue and fever.  Respiratory: Negative for chest tightness and shortness of breath.   Cardiovascular: Negative for chest pain and palpitations.  Gastrointestinal: Negative for abdominal pain, nausea and vomiting.  Musculoskeletal: Positive for arthralgias and  joint swelling.  Neurological: Negative for dizziness and weakness.    Social History  Substance Use Topics  . Smoking status: Former Research scientist (life sciences)  . Smokeless tobacco: Never Used  . Alcohol use No   Objective:   BP 128/62 (BP Location: Right Arm, Patient Position: Sitting, Cuff Size: Normal)   Temp 98 F (36.7 C)   Resp 16   Wt 128 lb (58.1 kg)   BMI 23.41 kg/m  Vitals:   12/27/16 1148  BP: 128/62  Resp: 16  Temp: 98 F (36.7 C)  Weight: 128 lb (58.1 kg)     Physical Exam  General appearance: alert, well developed, well nourished, cooperative and in no distress Head: Normocephalic, without obvious abnormality, atraumatic Respiratory: Respirations even and unlabored,  normal respiratory rate Extremities: No gross deformities       Assessment & Plan:     1. 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  - Uric acid  2. Hypothyroidism, unspecified type Doing well current dose of levothyroxine.  - TSH   3. Hyperlipidemia, unspecified hyperlipidemia type Tolerating ezetimibe well without adverse effects.  - Lipid panel  4. Estrogen deficiency She is going to see if she can get BMD done in [place] where she has mammograms done. She will call if an order is needed.        [name], MD  [facility]

## 2017-01-01 ENCOUNTER — Other Ambulatory Visit: Payer: Self-pay | Admitting: Family Medicine

## 2017-01-01 ENCOUNTER — Telehealth: Payer: Self-pay | Admitting: Family Medicine

## 2017-01-01 DIAGNOSIS — E785 Hyperlipidemia, unspecified: Secondary | ICD-10-CM

## 2017-01-01 MED ORDER — EZETIMIBE 10 MG PO TABS: 10.0000 mg | ORAL_TABLET | Freq: Every day | ORAL | 11 refills | Status: DC

## 2017-01-01 NOTE — Telephone Encounter (Signed)
-----   Message from Birdie Sons, MD sent at 01/01/2017  7:57 AM EST ----- Uric acid level and thyroid levels are normal. Cholesterol is up to 287. Please verify whether she is taking ezetimibe consistently. If so then will need another cholesterol medication, if not then need to start taking every day.

## 2017-01-01 NOTE — Telephone Encounter (Addendum)
Patient was notified of results. Patient states she does not recall ever getting an rx for ezetimibe. Patient would like [place] to sent a 30 day supply to [facility].

## 2017-01-01 NOTE — Telephone Encounter (Signed)
Pt returned nurse call to get results. Please advise. Thanks TNP

## 2017-01-02 NOTE — Telephone Encounter (Signed)
Rx was sent to pharmacy. 

## 2017-01-09 ENCOUNTER — Other Ambulatory Visit: Payer: Self-pay | Admitting: Family Medicine

## 2017-01-09 NOTE — Telephone Encounter (Signed)
Pharmacy requesting refills.

## 2017-01-17 ENCOUNTER — Other Ambulatory Visit: Payer: Self-pay | Admitting: Family Medicine

## 2017-01-17 ENCOUNTER — Telehealth: Payer: Self-pay | Admitting: Family Medicine

## 2017-01-17 DIAGNOSIS — Z1231 Encounter for screening mammogram for malignant neoplasm of breast: Secondary | ICD-10-CM

## 2017-01-17 DIAGNOSIS — M8580 Other specified disorders of bone density and structure, unspecified site: Secondary | ICD-10-CM

## 2017-01-17 NOTE — Telephone Encounter (Signed)
Pt is requesting the order for her bone density scan be faxed to [place] at [phone]. Please advise. Thanks TNP

## 2017-01-17 NOTE — Telephone Encounter (Signed)
Order for bone density faxed to [place]

## 2017-01-29 DIAGNOSIS — L84 Corns and callosities: Secondary | ICD-10-CM | POA: Diagnosis not present

## 2017-02-14 ENCOUNTER — Ambulatory Visit: Payer: Medicare HMO

## 2017-02-14 ENCOUNTER — Other Ambulatory Visit: Payer: Medicare HMO

## 2017-02-21 ENCOUNTER — Ambulatory Visit: Payer: Medicare HMO

## 2017-02-23 ENCOUNTER — Ambulatory Visit: Payer: Medicare HMO

## 2017-03-08 ENCOUNTER — Other Ambulatory Visit: Payer: Medicare HMO

## 2017-03-14 DIAGNOSIS — R69 Illness, unspecified: Secondary | ICD-10-CM | POA: Diagnosis not present

## 2017-03-15 ENCOUNTER — Ambulatory Visit: Payer: Medicare HMO

## 2017-03-15 ENCOUNTER — Other Ambulatory Visit: Payer: Medicare HMO

## 2017-04-10 ENCOUNTER — Ambulatory Visit
Admission: RE | Admit: 2017-04-10 | Discharge: 2017-04-10 | Disposition: A | Discharge status: Home / Self Care | Payer: Medicare HMO | Source: Ambulatory Visit | Attending: Family Medicine | Admitting: Family Medicine

## 2017-04-10 ENCOUNTER — Encounter: Payer: Self-pay | Admitting: Family Medicine

## 2017-04-10 DIAGNOSIS — M8580 Other specified disorders of bone density and structure, unspecified site: Secondary | ICD-10-CM

## 2017-04-10 DIAGNOSIS — M810 Age-related osteoporosis without current pathological fracture: Secondary | ICD-10-CM | POA: Diagnosis not present

## 2017-04-10 DIAGNOSIS — Z87898 Personal history of other specified conditions: Secondary | ICD-10-CM | POA: Insufficient documentation

## 2017-04-10 DIAGNOSIS — Z1231 Encounter for screening mammogram for malignant neoplasm of breast: Secondary | ICD-10-CM | POA: Diagnosis not present

## 2017-04-10 DIAGNOSIS — Z780 Asymptomatic menopausal state: Secondary | ICD-10-CM | POA: Diagnosis not present

## 2017-04-10 DIAGNOSIS — J309 Allergic rhinitis, unspecified: Secondary | ICD-10-CM | POA: Insufficient documentation

## 2017-04-10 DIAGNOSIS — Z8619 Personal history of other infectious and parasitic diseases: Secondary | ICD-10-CM | POA: Insufficient documentation

## 2017-04-13 ENCOUNTER — Ambulatory Visit (INDEPENDENT_AMBULATORY_CARE_PROVIDER_SITE_OTHER): Payer: Medicare HMO | Admitting: Family Medicine

## 2017-04-13 VITALS — BP 130/80 | HR 71 | Temp 98.3°F | Resp 16 | Wt 140.0 lb

## 2017-04-13 DIAGNOSIS — M1711 Unilateral primary osteoarthritis, right knee: Secondary | ICD-10-CM

## 2017-04-13 DIAGNOSIS — M25561 Pain in right knee: Secondary | ICD-10-CM

## 2017-04-13 DIAGNOSIS — M810 Age-related osteoporosis without current pathological fracture: Secondary | ICD-10-CM | POA: Diagnosis not present

## 2017-04-13 MED ORDER — ALENDRONATE SODIUM 70 MG PO TABS: 70.0000 mg | ORAL_TABLET | ORAL | 4 refills | Status: DC

## 2017-04-13 MED ORDER — ALENDRONATE SODIUM 70 MG PO TABS: 70.0000 mg | ORAL_TABLET | ORAL | 11 refills | Status: DC

## 2017-04-13 NOTE — Patient Instructions (Addendum)
You should here from [facility] next week about a referral to Dr. Lisette Grinder for knee pain  Alendronate  How should I use this medicine? You must take this medicine exactly as directed or you will lower the amount of medicine you absorb into your body or you may cause yourself harm. Take your dose by mouth first thing in the morning, after you are up for the day. Do not eat or drink anything before you take this medicine. Swallow your medicine with a full glass (6 to 8 fluid ounces) of plain water. Do not take this tablet with any with any other drink. Follow the directions on the prescription label. After taking this medicine, do not eat breakfast, drink, or take any medicines or vitamins for at least 45 minutes. Stand or sit up for at least 45 minutes after you take this medicine; do not lie down. Take the medicine on the same day every week. Do not take your medicine more often than directed. Do not stop taking except on your doctor's advice. Talk to your pediatrician regarding the use of this medicine in children. Special care may be needed. Overdosage: If you think you have taken too much of this medicine contact a poison control center or emergency room at once. NOTE: This medicine is only for you. Do not share this medicine with others. What if I miss a dose?   If you miss a dose, take the dose on the morning after you remember. Take your next dose on your regular chosen day of the week, but do not take 2 tablets on the same day. Do not take double or extra doses.  What may interact with this medicine? -antacids -aspirin and aspirin like drugs -calcium supplements, especially calcium with vitamin D -cholestyramine or colestipol -cimetidine, ranitidine, or other medicines used to decrease stomach acid -corticosteroids -iron supplements -magnesium supplements -mineral oil -NSAIDs, medicines for pain and inflammation, like ibuprofen or  naproxen -orlistat -phenobarbital -phenytoin -phosphorous supplements -primidone -steroid medicines like prednisone or cortisone -thiazide diuretics -vitamins with minerals, especially with vitamin D This list may not describe all possible interactions. Give your health care provider a list of all the medicines, herbs, non-prescription drugs, or dietary supplements you use. Also tell them if you smoke, drink alcohol, or use illegal drugs. Some items may interact with your medicine.  What should I watch for while using this medicine? Visit your doctor or health care professional for regular checkups. It may be some time before you see the benefit from this medicine. Do not stop taking your medicine unless your doctor tells you to. Your doctor may order blood tests or other tests to see how you are doing. You should make sure that you get enough calcium and vitamin D while you are taking this medicine. Discuss the foods you eat and the vitamins you take with your health care professional. If you have pain when swallowing, difficulty swallowing, heartburn, or stomach pain, immediately call your doctor or health care professional. If you are taking an antacid, a mineral supplement like calcium or iron, or a vitamin with minerals, wait to take them at least 30 minutes after you take this medicine. Do not take them at same time. This medicine can make you more sensitive to the sun. If you get a rash while taking this medicine, sunlight may cause the rash to get worse. Keep out of the sun. If you cannot avoid being in the sun, wear protective clothing and use sunscreen. Do  not use sun lamps or tanning beds/booths. Some people who take this medicine have severe bone, joint, and/or muscle pain. This medicine may also increase your risk for a broken thigh bone. Tell your doctor right away if you have pain in your upper leg or groin. Tell your doctor if you have any pain that does not go away or that gets  worse.

## 2017-04-13 NOTE — Progress Notes (Signed)
Patient: Laura Love Female    DOB: [birth year]   77 y.o.   MRN: [id number] Visit Date: 04/13/2017  Today's Provider: [name], MD   Chief Complaint  Patient presents with  . Knee Pain   Subjective:    Patient has had right knee pain for 3 days. Patient states she has had right knee pain for years. Patient states pain flared up 3 days ago while she was out running errands. Patient states pain has lessened today. Right knee is swollen, difficult to walk on. Patient has been using ice, heat, rest, ibuprofen and mobisyl cream with mild relief.    Knee Pain   The incident occurred 3 to 5 days ago (3 days). There was no injury mechanism. The pain is present in the right knee. The quality of the pain is described as stabbing. The pain is at a severity of 3/10. The pain is mild. The pain has been intermittent since onset. Associated symptoms include an inability to bear weight, a loss of motion and muscle weakness. Pertinent negatives include no loss of sensation, numbness or tingling. She reports no foreign bodies present. The symptoms are aggravated by movement and weight bearing. She has tried NSAIDs, immobilization, non-weight bearing, rest and elevation (mobisyl cream) for the symptoms. The treatment provided moderate relief.   She states she first started having knee pain around 16 years ago and head steroid injections and arthroscopic surgery which provided minimal relief. Was told that she had severe osteoarthritis. Since then has had flare ups from time to time but has been more persistent recently. She is inquiring today about stem cell treatments and whether she should consider having that done.    She is also here to follow up on recent BMD with T-score=-4.7 of right hip and -1.4 on right wrist. She has many questions regarding osteoporosis and side effects of medications.    Allergies  Allergen Reactions  . Crestor [Rosuvastatin Calcium]      Current Outpatient  Medications:  .  albuterol (PROVENTIL HFA;VENTOLIN HFA) 108 (90 Base) MCG/ACT inhaler, Inhale 2 puffs into the lungs every 6 (six) hours as needed for wheezing or shortness of breath., Disp: 1 Inhaler, Rfl: 2 .  Biotin 1000 MCG tablet, Take 1,000 mcg by mouth daily.  , Disp: , Rfl:  .  budesonide-formoterol (SYMBICORT) 160-4.5 MCG/ACT inhaler, 1-2 puffs twice daily, Disp: 1 Inhaler, Rfl: 12 .  buPROPion (WELLBUTRIN SR) 150 MG 12 hr tablet, TAKE ONE TABLET BY MOUTH TWICE DAILY, Disp: 60 tablet, Rfl: 12 .  ezetimibe (ZETIA) 10 MG tablet, Take 1 tablet (10 mg total) daily by mouth., Disp: 90 tablet, Rfl: 11 .  ibuprofen (ADVIL,MOTRIN) 200 MG tablet, Take 200 mg by mouth every 6 (six) hours as needed. 2 tablets every 3 days , Disp: , Rfl:  .  meclizine (ANTIVERT) 25 MG tablet, Take 1 tablet (25 mg total) by mouth 3 (three) times daily as needed for dizziness., Disp: 30 tablet, Rfl: 0 .  Melatonin 3 MG TABS, Take 1 tablet by mouth at bedtime., Disp: , Rfl:  .  sennosides-docusate sodium (SENOKOT-S) 8.6-50 MG tablet, Take 2-3 tablets by mouth 2 (two) times daily. 3 tablets every morning, then 3-4 tablets in the evenings, Disp: , Rfl:  .  SYNTHROID 100 MCG tablet, TAKE 1 TABLET BY MOUTH DAILY, Disp: 30 tablet, Rfl: 12 .  vitamin C (ASCORBIC ACID) 500 MG tablet, Take 500 mg by mouth daily.  , Disp: ,  Rfl:  .  Vitamin D, Cholecalciferol, 1000 UNITS TABS, Take 2 tablets by mouth daily., Disp: , Rfl:  .  zinc gluconate 50 MG tablet, Take 50 mg by mouth daily as needed.  , Disp: , Rfl:   Review of Systems  Constitutional: Negative for appetite change, chills, fatigue and fever.  Respiratory: Negative for chest tightness and shortness of breath.   Cardiovascular: Negative for chest pain and palpitations.  Gastrointestinal: Negative for abdominal pain, nausea and vomiting.  Neurological: Negative for dizziness, tingling, weakness and numbness.    Social History   Tobacco Use  . Smoking status: Former  Research scientist (life sciences)  . Smokeless tobacco: Never Used  Substance Use Topics  . Alcohol use: No    Alcohol/week: 0.0 oz   Objective:   BP 130/80   Pulse 71   Temp 98.3 F (36.8 C)   Resp 16   Wt 140 lb (63.5 kg)   SpO2 95%   BMI 25.61 kg/m     Physical Exam  General appearance: alert, well developed, well nourished, cooperative and in no distress Head: Normocephalic, without obvious abnormality, atraumatic Respiratory: Respirations even and unlabored, normal respiratory rate MS: Mild tenderness along joint line of right knee, no effusion or erythema.      Assessment & Plan:     1. Acute pain of right knee Likely due to osteoarthritis. She inquired about stem cell therapy and admitted I am not aware of any definitive studies regarding its effectiveness or safety. She states steroid injections have not helped in the past. Discusses hyaluronic injections which she expressed some interest. I recommended that she discuss treatment options with orthopedist. - Ambulatory referral to Orthopedic Surgery  2. Primary osteoarthritis of right knee   3. Osteoporosis, unspecified osteoporosis type, unspecified pathological fracture presence Extensively counseled regarding risks of untreated osteoporosis and reduction of fracture risk associated with bisphosphonate use. Also discussed various potential adverse effects of medication. She is willing to try alendronate. She does not tolerate large vitamins, but will try taking daily children's chewable vitamin. She is a mild drinking and probably gets sufficient calcium through her diet.   Over half of this 25 minute visit were spent in counseling and coordinating care of medical problems.  - alendronate (FOSAMAX) 70 MG tablet; Take 1 tablet (70 mg total) by mouth every 7 (seven) days. Take with a full glass of water on an empty stomach.  Dispense: 12 tablet; Refill: [place], MD  [facility]

## 2017-05-16 ENCOUNTER — Telehealth: Payer: Self-pay | Admitting: Family Medicine

## 2017-05-16 DIAGNOSIS — M810 Age-related osteoporosis without current pathological fracture: Secondary | ICD-10-CM

## 2017-05-16 NOTE — Telephone Encounter (Signed)
Please refer to endocrinology. Thanks!

## 2017-05-16 NOTE — Telephone Encounter (Signed)
Patient was advised and agrees to referral.

## 2017-05-16 NOTE — Telephone Encounter (Signed)
OK. She will need to go to Dr. Joycie Peek office for this. Please advise and order referral to endocrinology for osteoporosis

## 2017-05-16 NOTE — Telephone Encounter (Signed)
Please advise 

## 2017-05-16 NOTE — Telephone Encounter (Signed)
Pt requesting to be changed to an annual infusion (Reclast) instead of the Fosamax 70 MG.  States the Fosamax is giving her daily heartburn that is getting worse.  The patient has stopped the medication due to this.

## 2017-05-23 ENCOUNTER — Telehealth: Payer: Self-pay | Admitting: Family Medicine

## 2017-05-23 NOTE — Telephone Encounter (Signed)
Do you know anyone else that does Prolia injections? I don't

## 2017-05-23 NOTE — Telephone Encounter (Signed)
Please advise. Thanks.  

## 2017-05-23 NOTE — Telephone Encounter (Signed)
Pt called saying [facility] can not see her until the end of June for a bone density treatment infusion.  She wants to know if we can send her to another doctor for this treatment.  Her call back is [phone]  Thanks teri

## 2017-05-28 ENCOUNTER — Encounter: Payer: Self-pay | Admitting: Family Medicine

## 2017-05-28 ENCOUNTER — Ambulatory Visit (INDEPENDENT_AMBULATORY_CARE_PROVIDER_SITE_OTHER): Payer: Medicare HMO | Admitting: Family Medicine

## 2017-05-28 VITALS — BP 130/64 | HR 89 | Temp 98.3°F | Resp 16 | Wt 136.0 lb

## 2017-05-28 DIAGNOSIS — J329 Chronic sinusitis, unspecified: Secondary | ICD-10-CM

## 2017-05-28 DIAGNOSIS — R059 Cough, unspecified: Secondary | ICD-10-CM

## 2017-05-28 DIAGNOSIS — J040 Acute laryngitis: Secondary | ICD-10-CM | POA: Diagnosis not present

## 2017-05-28 DIAGNOSIS — R05 Cough: Secondary | ICD-10-CM

## 2017-05-28 MED ORDER — AZITHROMYCIN 250 MG PO TABS: ORAL_TABLET | ORAL | 0 refills | Status: AC

## 2017-05-28 MED ORDER — PREDNISONE 20 MG PO TABS: 20.0000 mg | ORAL_TABLET | Freq: Two times a day (BID) | ORAL | 0 refills | Status: AC

## 2017-05-28 MED ORDER — HYDROCODONE-HOMATROPINE 5-1.5 MG/5ML PO SYRP: 5.0000 mL | ORAL_SOLUTION | Freq: Three times a day (TID) | ORAL | 0 refills | Status: DC | PRN

## 2017-05-28 NOTE — Progress Notes (Signed)
Patient: Laura Love Female    DOB: [birth year]   77 y.o.   MRN: [id number] Visit Date: 05/28/2017  Today's Provider: [name], MD   Chief Complaint  Patient presents with  . Cough   Subjective:    Patient has had sinus pressure and sinus pain. Patient also has symptoms of cough, sore throat, headache, muscle aches, shortness of breath, and wheezing. Patient has been taking otc mucinex, Delsym and nasal spray.   Cough  This is a new problem. The current episode started in the past 7 days (4 days). The problem has been gradually worsening. The cough is productive of sputum. Associated symptoms include chest pain, headaches, myalgias, nasal congestion, postnasal drip, rhinorrhea, a sore throat, shortness of breath and wheezing. Pertinent negatives include no chills, ear congestion, ear pain, fever, heartburn, hemoptysis, rash, sweats or weight loss. The symptoms are aggravated by lying down and exercise. Treatments tried: mucinex, delsym and nasal spray. The treatment provided mild relief. Her past medical history is significant for bronchiectasis and pneumonia.      Allergies  Allergen Reactions  . Crestor [Rosuvastatin Calcium]      Current Outpatient Medications:  .  albuterol (PROVENTIL HFA;VENTOLIN HFA) 108 (90 Base) MCG/ACT inhaler, Inhale 2 puffs into the lungs every 6 (six) hours as needed for wheezing or shortness of breath., Disp: 1 Inhaler, Rfl: 2 .  Biotin 1000 MCG tablet, Take 1,000 mcg by mouth daily.  , Disp: , Rfl:  .  budesonide-formoterol (SYMBICORT) 160-4.5 MCG/ACT inhaler, 1-2 puffs twice daily, Disp: 1 Inhaler, Rfl: 12 .  buPROPion (WELLBUTRIN SR) 150 MG 12 hr tablet, TAKE ONE TABLET BY MOUTH TWICE DAILY, Disp: 60 tablet, Rfl: 12 .  CALCIUM PO, Take 500 mg by mouth., Disp: , Rfl:  .  ezetimibe (ZETIA) 10 MG tablet, Take 1 tablet (10 mg total) daily by mouth., Disp: 90 tablet, Rfl: 11 .  ibuprofen (ADVIL,MOTRIN) 200 MG tablet, Take 200 mg by mouth  every 6 (six) hours as needed. 2 tablets every 3 days , Disp: , Rfl:  .  meclizine (ANTIVERT) 25 MG tablet, Take 1 tablet (25 mg total) by mouth 3 (three) times daily as needed for dizziness., Disp: 30 tablet, Rfl: 0 .  Melatonin 3 MG TABS, Take 1 tablet by mouth at bedtime., Disp: , Rfl:  .  sennosides-docusate sodium (SENOKOT-S) 8.6-50 MG tablet, Take 2-3 tablets by mouth 2 (two) times daily. 3 tablets every morning, then 3-4 tablets in the evenings, Disp: , Rfl:  .  SYNTHROID 100 MCG tablet, TAKE 1 TABLET BY MOUTH DAILY, Disp: 30 tablet, Rfl: 12 .  vitamin C (ASCORBIC ACID) 500 MG tablet, Take 500 mg by mouth daily.  , Disp: , Rfl:  .  Vitamin D, Cholecalciferol, 1000 UNITS TABS, Take 2 tablets by mouth daily., Disp: , Rfl:  .  Zinc 50 MG CAPS, Take by mouth., Disp: , Rfl:  .  zinc gluconate 50 MG tablet, Take 50 mg by mouth daily as needed.  , Disp: , Rfl:   Review of Systems  Constitutional: Negative for appetite change, chills, fatigue, fever and weight loss.  HENT: Positive for congestion, postnasal drip, rhinorrhea, sinus pressure, sinus pain, sore throat and voice change. Negative for ear pain.   Respiratory: Positive for cough, shortness of breath and wheezing. Negative for hemoptysis and chest tightness.   Cardiovascular: Positive for chest pain. Negative for palpitations.  Gastrointestinal: Negative for abdominal pain, heartburn, nausea and vomiting.  Musculoskeletal: Positive for myalgias.  Skin: Negative for rash.  Neurological: Positive for headaches. Negative for dizziness and weakness.    Social History   Tobacco Use  . Smoking status: Former Research scientist (life sciences)  . Smokeless tobacco: Never Used  Substance Use Topics  . Alcohol use: No    Alcohol/week: 0.0 oz   Objective:   BP 130/64 (BP Location: Right Arm, Patient Position: Sitting, Cuff Size: Normal)   Pulse 89   Temp 98.3 F (36.8 C) (Oral)   Resp 16   Wt 136 lb (61.7 kg)   SpO2 94%   BMI 24.87 kg/m  Vitals:   05/28/17  1004  BP: 130/64  Pulse: 89  Resp: 16  Temp: 98.3 F (36.8 C)  TempSrc: Oral  SpO2: 94%  Weight: 136 lb (61.7 kg)     Physical Exam  General Appearance:    Alert, cooperative, no distress  HENT:   bilateral TM normal without fluid or infection, neck without nodes, frontal and maxillary sinuses tender and nasal mucosa congested. Patient is whispering due to hoarseness.   Eyes:    PERRL, conjunctiva/corneas clear, EOM's intact       Lungs:     Clear to auscultation bilaterally, respirations unlabored  Heart:    Regular rate and rhythm  Neurologic:   Awake, alert, oriented x 3. No apparent focal neurological           defect.           Assessment & Plan:     1. Sinusitis, unspecified chronicity, unspecified location  - azithromycin (ZITHROMAX) 250 MG tablet; 2 by mouth today, then 1 daily for 4 days  Dispense: 6 tablet; Refill: 0 - predniSONE (DELTASONE) 20 MG tablet; Take 1 tablet (20 mg total) by mouth 2 (two) times daily with a meal for 5 days.  Dispense: 10 tablet; Refill: 0  2. Cough  - predniSONE (DELTASONE) 20 MG tablet; Take 1 tablet (20 mg total) by mouth 2 (two) times daily with a meal for 5 days.  Dispense: 10 tablet; Refill: 0 - HYDROcodone-homatropine (HYCODAN) 5-1.5 MG/5ML syrup; Take 5 mLs by mouth every 8 (eight) hours as needed.  Dispense: 100 mL; Refill: 0  3. Laryngitis Expect 3-7 days for voice to return. Call if symptoms change or if not rapidly improving.           [name], MD  [facility]

## 2017-05-30 NOTE — Telephone Encounter (Signed)
Pt advised there is no one else in the [place] area that treats osteoporosis.She states she will call back after researching doctors outside of [facility]

## 2017-07-04 ENCOUNTER — Other Ambulatory Visit: Payer: Self-pay | Admitting: Family Medicine

## 2017-07-11 ENCOUNTER — Ambulatory Visit: Payer: Medicare HMO | Admitting: Family Medicine

## 2017-08-01 DIAGNOSIS — M810 Age-related osteoporosis without current pathological fracture: Secondary | ICD-10-CM | POA: Diagnosis not present

## 2017-08-02 ENCOUNTER — Telehealth: Payer: Self-pay | Admitting: Family Medicine

## 2017-08-02 DIAGNOSIS — R109 Unspecified abdominal pain: Secondary | ICD-10-CM

## 2017-08-02 MED ORDER — DICYCLOMINE HCL 10 MG PO CAPS: 10.0000 mg | ORAL_CAPSULE | Freq: Three times a day (TID) | ORAL | 3 refills | Status: DC

## 2017-08-02 NOTE — Telephone Encounter (Signed)
Please advise 

## 2017-08-02 NOTE — Telephone Encounter (Signed)
Previously prescribed  dicyclomine (BENTYL) 10 mg capsule  Take 10 mg by mouth Four (4) times a day (before meals and nightly).    By Dr. Carlyn Reichert GI

## 2017-08-02 NOTE — Telephone Encounter (Signed)
Pt needs a refill on a medication that you have not refilled for her  Generic Bentyl 10 mg.  She uses it for her stomach pain.  [facility]   Pt call [phone]  Thanks Con Memos

## 2017-08-22 ENCOUNTER — Encounter: Payer: Self-pay | Admitting: Physician Assistant

## 2017-08-22 ENCOUNTER — Ambulatory Visit (INDEPENDENT_AMBULATORY_CARE_PROVIDER_SITE_OTHER): Payer: Medicare HMO | Admitting: Physician Assistant

## 2017-08-22 VITALS — BP 136/76 | HR 64 | Temp 98.2°F | Resp 16 | Wt 138.0 lb

## 2017-08-22 DIAGNOSIS — R5383 Other fatigue: Secondary | ICD-10-CM

## 2017-08-22 DIAGNOSIS — E039 Hypothyroidism, unspecified: Secondary | ICD-10-CM | POA: Diagnosis not present

## 2017-08-22 DIAGNOSIS — E785 Hyperlipidemia, unspecified: Secondary | ICD-10-CM

## 2017-08-22 DIAGNOSIS — R69 Illness, unspecified: Secondary | ICD-10-CM | POA: Diagnosis not present

## 2017-08-22 DIAGNOSIS — F341 Dysthymic disorder: Secondary | ICD-10-CM
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
## 2017-08-23 ENCOUNTER — Telehealth: Payer: Self-pay | Admitting: *Deleted

## 2017-08-23 LAB — COMPREHENSIVE METABOLIC PANEL
ALT: 15 IU/L (ref 0–32)
AST: 13 IU/L (ref 0–40)
Albumin/Globulin Ratio: 2 (ref 1.2–2.2)
Albumin: 4.3 g/dL (ref 3.5–4.8)
Alkaline Phosphatase: 87 IU/L (ref 39–117)
BUN/Creatinine Ratio: 16 (ref 12–28)
BUN: 12 mg/dL (ref 8–27)
Bilirubin Total: 0.3 mg/dL (ref 0.0–1.2)
CO2: 22 mmol/L (ref 20–29)
Calcium: 9.2 mg/dL (ref 8.7–10.3)
Chloride: 100 mmol/L (ref 96–106)
Creatinine, Ser: 0.75 mg/dL (ref 0.57–1.00)
GFR calc Af Amer: 90 mL/min/{1.73_m2} (ref >59)
GFR calc non Af Amer: 78 mL/min/{1.73_m2} (ref >59)
Globulin, Total: 2.1 g/dL (ref 1.5–4.5)
Glucose: 91 mg/dL (ref 65–99)
Potassium: 4.2 mmol/L (ref 3.5–5.2)
Sodium: 138 mmol/L (ref 134–144)
Total Protein: 6.4 g/dL (ref 6.0–8.5)

## 2017-08-23 LAB — LIPID PANEL
Chol/HDL Ratio: 4.2 ratio (ref 0.0–4.4)
Cholesterol, Total: 280 mg/dL — ABNORMAL HIGH (ref 100–199)
HDL: 66 mg/dL (ref >39)
LDL Calculated: 166 mg/dL — ABNORMAL HIGH (ref 0–99)
Triglycerides: 238 mg/dL — ABNORMAL HIGH (ref 0–149)
VLDL Cholesterol Cal: 48 mg/dL — ABNORMAL HIGH (ref 5–40)

## 2017-08-23 LAB — CBC WITH DIFFERENTIAL/PLATELET
Basophils Absolute: 0.1 10*3/uL (ref 0.0–0.2)
Basos: 2 %
EOS (ABSOLUTE): 0.1 10*3/uL (ref 0.0–0.4)
Eos: 1 %
Hematocrit: 42.5 % (ref 34.0–46.6)
Hemoglobin: 14.1 g/dL (ref 11.1–15.9)
Immature Grans (Abs): 0 10*3/uL (ref 0.0–0.1)
Immature Granulocytes: 0 %
Lymphocytes Absolute: 1.4 10*3/uL (ref 0.7–3.1)
Lymphs: 28 %
MCH: 32.3 pg (ref 26.6–33.0)
MCHC: 33.2 g/dL (ref 31.5–35.7)
MCV: 97 fL (ref 79–97)
Monocytes Absolute: 0.4 10*3/uL (ref 0.1–0.9)
Monocytes: 7 %
Neutrophils Absolute: 3.1 10*3/uL (ref 1.4–7.0)
Neutrophils: 62 %
Platelets: 243 10*3/uL (ref 150–450)
RBC: 4.37 x10E6/uL (ref 3.77–5.28)
RDW: 12.3 % (ref 12.3–15.4)
WBC: 5 10*3/uL (ref 3.4–10.8)

## 2017-08-23 LAB — TSH: TSH: 1.86 u[IU]/mL (ref 0.450–4.500)

## 2017-08-23 NOTE — Patient Instructions (Signed)

## 2017-08-23 NOTE — Telephone Encounter (Signed)
-----   Message from Trinna Post, [place] sent at 08/23/2017 11:23 AM EDT ----- Cholesterol remains uncontrolled but stable compared to last year. TSH normal. Do not think she needs to change her dose of synthroid. CBC shows no anemia. CMET shows normal kidney and live function, normal electrolytes.

## 2017-08-23 NOTE — Telephone Encounter (Signed)
LMOVM for pt to return call 

## 2017-08-27 NOTE — Telephone Encounter (Signed)
Pt advised; She states she has been of Zetia for a while.  She stated she is going to restart.  Thanks,   -Mickel Baas

## 2017-09-04 DIAGNOSIS — M810 Age-related osteoporosis without current pathological fracture: Secondary | ICD-10-CM | POA: Diagnosis not present

## 2017-10-03 DIAGNOSIS — D2262 Melanocytic nevi of left upper limb, including shoulder: Secondary | ICD-10-CM | POA: Diagnosis not present

## 2017-10-03 DIAGNOSIS — D2261 Melanocytic nevi of right upper limb, including shoulder: Secondary | ICD-10-CM | POA: Diagnosis not present

## 2017-10-03 DIAGNOSIS — D225 Melanocytic nevi of trunk: Secondary | ICD-10-CM | POA: Diagnosis not present

## 2017-10-03 DIAGNOSIS — L814 Other melanin hyperpigmentation: Secondary | ICD-10-CM | POA: Diagnosis not present

## 2017-10-03 DIAGNOSIS — D2272 Melanocytic nevi of left lower limb, including hip: Secondary | ICD-10-CM | POA: Diagnosis not present

## 2017-10-03 DIAGNOSIS — X32XXXA Exposure to sunlight, initial encounter: Secondary | ICD-10-CM | POA: Diagnosis not present

## 2017-10-03 DIAGNOSIS — D2271 Melanocytic nevi of right lower limb, including hip: Secondary | ICD-10-CM | POA: Diagnosis not present

## 2017-10-05 ENCOUNTER — Other Ambulatory Visit: Payer: Self-pay

## 2017-10-05 MED ORDER — SYNTHROID 100 MCG PO TABS: 100.0000 ug | ORAL_TABLET | Freq: Every day | ORAL | 3 refills | Status: DC

## 2017-10-19 ENCOUNTER — Other Ambulatory Visit: Payer: Self-pay | Admitting: Family Medicine

## 2017-10-19 NOTE — Telephone Encounter (Signed)
Patient advised.

## 2017-10-19 NOTE — Telephone Encounter (Signed)
No, it's only for people who have coronary artery disease or who very high LDL cholesterol >190.

## 2017-10-19 NOTE — Telephone Encounter (Signed)
Pt calling stating she can no longer take the following medication  ( ezetimibe (ZETIA) 10 MG tablet )  and would like to know if her provider would like to try something else and if so please call to advise. Thanks CC

## 2017-10-19 NOTE — Telephone Encounter (Signed)
Called patient back for more information. Patient stated while she was taking Zetia she felt bad all the time. Also patient stated Zetia made her feel bloated. Patient stated since stopping Zetia these symptoms have gone away. Patient wanted to know if she is a candidate for the cholesterol injection medication? Please advise?

## 2017-10-30 ENCOUNTER — Telehealth: Payer: Self-pay | Admitting: Family Medicine

## 2017-10-30 DIAGNOSIS — G8929 Other chronic pain: Secondary | ICD-10-CM

## 2017-10-30 DIAGNOSIS — M25561 Pain in right knee: Principal | ICD-10-CM

## 2017-10-30 NOTE — Telephone Encounter (Signed)
Patient is requesting a referral to J. Cameron Proud at [facility] for her right knee.  She states that she did not go if she was referred before.

## 2017-11-05 ENCOUNTER — Telehealth: Payer: Self-pay | Admitting: Family Medicine

## 2017-11-05 NOTE — Telephone Encounter (Signed)
Patient has an appt. On 11/14/17 with a PA at [facility] named  Mr. Mikle Bosworth but she wants to know if we can get her in any sooner with someone else.  Said she would drive to [place] if needed

## 2017-11-05 NOTE — Telephone Encounter (Signed)
Can you see if someone can see her at [facility] for her knee pain sooner than 11-14-17. Thanks.

## 2017-11-08 DIAGNOSIS — M25561 Pain in right knee: Secondary | ICD-10-CM | POA: Insufficient documentation

## 2017-11-08 DIAGNOSIS — M1711 Unilateral primary osteoarthritis, right knee: Secondary | ICD-10-CM | POA: Diagnosis not present

## 2017-11-12 DIAGNOSIS — H2513 Age-related nuclear cataract, bilateral: Secondary | ICD-10-CM | POA: Diagnosis not present

## 2017-11-20 ENCOUNTER — Ambulatory Visit (INDEPENDENT_AMBULATORY_CARE_PROVIDER_SITE_OTHER): Payer: Medicare HMO | Admitting: Family Medicine

## 2017-11-20 DIAGNOSIS — Z23 Encounter for immunization: Secondary | ICD-10-CM | POA: Diagnosis not present

## 2017-11-22 DIAGNOSIS — M1711 Unilateral primary osteoarthritis, right knee: Secondary | ICD-10-CM | POA: Diagnosis not present

## 2017-11-29 DIAGNOSIS — M1711 Unilateral primary osteoarthritis, right knee: Secondary | ICD-10-CM | POA: Diagnosis not present

## 2017-12-07 DIAGNOSIS — M1711 Unilateral primary osteoarthritis, right knee: Secondary | ICD-10-CM | POA: Diagnosis not present

## 2018-01-15 ENCOUNTER — Encounter: Payer: Self-pay | Admitting: Family Medicine

## 2018-01-15 DIAGNOSIS — K5730 Diverticulosis of large intestine without perforation or abscess without bleeding: Secondary | ICD-10-CM | POA: Diagnosis not present

## 2018-01-15 DIAGNOSIS — D122 Benign neoplasm of ascending colon: Secondary | ICD-10-CM | POA: Diagnosis not present

## 2018-01-15 DIAGNOSIS — Z87891 Personal history of nicotine dependence: Secondary | ICD-10-CM | POA: Diagnosis not present

## 2018-01-15 DIAGNOSIS — Z1211 Encounter for screening for malignant neoplasm of colon: Secondary | ICD-10-CM | POA: Diagnosis not present

## 2018-01-15 DIAGNOSIS — E785 Hyperlipidemia, unspecified: Secondary | ICD-10-CM | POA: Diagnosis not present

## 2018-01-15 DIAGNOSIS — K621 Rectal polyp: Secondary | ICD-10-CM | POA: Diagnosis not present

## 2018-01-15 DIAGNOSIS — K6389 Other specified diseases of intestine: Secondary | ICD-10-CM | POA: Diagnosis not present

## 2018-01-15 DIAGNOSIS — Z79899 Other long term (current) drug therapy: Secondary | ICD-10-CM | POA: Diagnosis not present

## 2018-01-15 DIAGNOSIS — Z86010 Personal history of colonic polyps: Secondary | ICD-10-CM | POA: Diagnosis not present

## 2018-01-15 DIAGNOSIS — D126 Benign neoplasm of colon, unspecified: Secondary | ICD-10-CM | POA: Diagnosis not present

## 2018-01-15 DIAGNOSIS — D128 Benign neoplasm of rectum: Secondary | ICD-10-CM | POA: Diagnosis not present

## 2018-01-15 DIAGNOSIS — E039 Hypothyroidism, unspecified: Secondary | ICD-10-CM | POA: Diagnosis not present

## 2018-01-15 LAB — HM COLONOSCOPY

## 2018-01-17 DIAGNOSIS — Z86010 Personal history of colonic polyps: Secondary | ICD-10-CM

## 2018-01-18 ENCOUNTER — Encounter: Payer: Self-pay | Admitting: Family Medicine

## 2018-02-01 ENCOUNTER — Other Ambulatory Visit: Payer: Self-pay | Admitting: Family Medicine

## 2018-02-01 ENCOUNTER — Telehealth: Payer: Self-pay | Admitting: Family Medicine

## 2018-02-01 DIAGNOSIS — E785 Hyperlipidemia, unspecified: Secondary | ICD-10-CM

## 2018-02-01 MED ORDER — EZETIMIBE 10 MG PO TABS: 10.0000 mg | ORAL_TABLET | Freq: Every day | ORAL | 4 refills | Status: DC

## 2018-02-01 NOTE — Telephone Encounter (Signed)
Pt advised of results per Fisher.  dbs

## 2018-02-01 NOTE — Telephone Encounter (Signed)
Patient said she does not understand her colonoscopy results from 01/15/18.  Can someone please call her and discuss this with her.

## 2018-02-01 NOTE — Telephone Encounter (Signed)
She had an adenoma and a hyperplastic polyp removed. Neither is cancerous, but the adenoma is associated with increased risk of developing colon cancer in the future. We usually recheck colonoscopy every 3-4 years when there is an adenoma.

## 2018-02-01 NOTE — Telephone Encounter (Signed)
Please advise   Thanks,    -Laura  

## 2018-02-01 NOTE — Telephone Encounter (Signed)
[facility] faxed refill request for the following medications:  ezetimibe (ZETIA) 10 MG tablet  Qty: 90  Original date written: 01/01/2017  Last date filled: 08/23/2017  Please advise.

## 2018-02-07 ENCOUNTER — Encounter: Payer: Self-pay | Admitting: Physician Assistant

## 2018-02-07 ENCOUNTER — Ambulatory Visit (INDEPENDENT_AMBULATORY_CARE_PROVIDER_SITE_OTHER): Payer: Medicare HMO | Admitting: Physician Assistant

## 2018-02-07 VITALS — BP 131/75 | HR 73 | Temp 97.8°F | Wt 138.4 lb

## 2018-02-07 DIAGNOSIS — J40 Bronchitis, not specified as acute or chronic: Secondary | ICD-10-CM | POA: Diagnosis not present

## 2018-02-07 MED ORDER — PREDNISONE 20 MG PO TABS: 20.0000 mg | ORAL_TABLET | Freq: Every day | ORAL | 0 refills | Status: AC

## 2018-02-07 NOTE — Progress Notes (Signed)
Patient: Laura Love Female    DOB: 05/22/1940   77 y.o.   MRN: [id number] Visit Date: 02/13/2018  Today's Provider: Trinna Post, PA-C   Chief Complaint  Patient presents with  . URI   Subjective:     HPI  Upper Respiratory Infection Patient presents today for URI symptoms for about 6 days. Patient is having the following symptoms: cough, congestion, fatigue, appetite change, sinus pressure and pain. Patient states she has been taking Delsym and Mucinex DM. She has been using an albuterol inhaler.      Allergies  Allergen Reactions  . Crestor [Rosuvastatin Calcium]      Current Outpatient Medications:  .  albuterol (PROVENTIL HFA;VENTOLIN HFA) 108 (90 Base) MCG/ACT inhaler, Inhale 2 puffs into the lungs every 6 (six) hours as needed for wheezing or shortness of breath., Disp: 1 Inhaler, Rfl: 2 .  Biotin 1000 MCG tablet, Take 1,000 mcg by mouth daily.  , Disp: , Rfl:  .  buPROPion (WELLBUTRIN SR) 150 MG 12 hr tablet, TAKE 1 TABLET BY MOUTH TWICE DAILY, Disp: 60 tablet, Rfl: 12 .  CALCIUM PO, Take 500 mg by mouth., Disp: , Rfl:  .  dicyclomine (BENTYL) 10 MG capsule, Take 1 capsule (10 mg total) by mouth 4 (four) times daily -  before meals and at bedtime. As needed for abdominal pain, Disp: 100 capsule, Rfl: 3 .  ezetimibe (ZETIA) 10 MG tablet, Take 1 tablet (10 mg total) by mouth daily., Disp: 90 tablet, Rfl: 4 .  HYDROcodone-homatropine (HYCODAN) 5-1.5 MG/5ML syrup, Take 5 mLs by mouth every 8 (eight) hours as needed., Disp: 100 mL, Rfl: 0 .  ibuprofen (ADVIL,MOTRIN) 200 MG tablet, Take 200 mg by mouth every 6 (six) hours as needed. 2 tablets every 3 days , Disp: , Rfl:  .  meclizine (ANTIVERT) 25 MG tablet, Take 1 tablet (25 mg total) by mouth 3 (three) times daily as needed for dizziness., Disp: 30 tablet, Rfl: 0 .  Melatonin 3 MG TABS, Take 1 tablet by mouth at bedtime., Disp: , Rfl:  .  sennosides-docusate sodium (SENOKOT-S) 8.6-50 MG tablet, Take 2-3 tablets  by mouth 2 (two) times daily. 3 tablets every morning, then 3-4 tablets in the evenings, Disp: , Rfl:  .  SYNTHROID 100 MCG tablet, Take 1 tablet (100 mcg total) by mouth daily., Disp: 90 tablet, Rfl: 3 .  vitamin C (ASCORBIC ACID) 500 MG tablet, Take 500 mg by mouth daily.  , Disp: , Rfl:  .  Vitamin D, Cholecalciferol, 1000 UNITS TABS, Take 2 tablets by mouth daily., Disp: , Rfl:  .  Zinc 50 MG CAPS, Take by mouth., Disp: , Rfl:  .  zinc gluconate 50 MG tablet, Take 50 mg by mouth daily as needed.  , Disp: , Rfl:  .  budesonide-formoterol (SYMBICORT) 160-4.5 MCG/ACT inhaler, 1-2 puffs twice daily, Disp: 1 Inhaler, Rfl: 12  Review of Systems  Constitutional: Positive for appetite change and fatigue.  HENT: Positive for congestion, sinus pressure and sinus pain.   Respiratory: Positive for cough, shortness of breath and wheezing.   Allergic/Immunologic: Negative.   Neurological: Negative.     Social History   Tobacco Use  . Smoking status: Former Research scientist (life sciences)  . Smokeless tobacco: Never Used  Substance Use Topics  . Alcohol use: No    Alcohol/week: 0.0 standard drinks   Objective:   BP 131/75 (BP Location: Left Arm, Patient Position: Sitting, Cuff Size: Normal)  Pulse 73   Temp 97.8 F (36.6 C) (Oral)   Wt 138 lb 6.4 oz (62.8 kg)   SpO2 98%   BMI 25.31 kg/m  Vitals:   02/07/18 1046  BP: 131/75  Pulse: 73  Temp: 97.8 F (36.6 C)  TempSrc: Oral  SpO2: 98%  Weight: 138 lb 6.4 oz (62.8 kg)     Physical Exam Constitutional:      Appearance: She is well-developed.  HENT:     Right Ear: External ear normal.     Left Ear: External ear normal.     Mouth/Throat:     Pharynx: No oropharyngeal exudate.  Eyes:     General:        Right eye: Discharge present.        Left eye: Discharge present. Neck:     Musculoskeletal: Neck supple.  Cardiovascular:     Rate and Rhythm: Normal rate and regular rhythm.  Pulmonary:     Effort: Pulmonary effort is normal. No respiratory  distress.     Breath sounds: Wheezing present. No rales.     Comments: Slight wheezing in left lung.  Lymphadenopathy:     Cervical: No cervical adenopathy.  Skin:    General: Skin is warm and dry.  Psychiatric:        Behavior: Behavior normal.         Assessment & Plan:    1. Bronchitis  Continue inhaler and take prednisone as below. Can continue cough medication.   - predniSONE (DELTASONE) 20 MG tablet; Take 1 tablet (20 mg total) by mouth daily with breakfast for 5 days.  Dispense: 5 tablet; Refill: 0  Return if symptoms worsen or fail to improve.  The entirety of the information documented in the History of Present Illness, Review of Systems and Physical Exam were personally obtained by me. Portions of this information were initially documented by Hurman Horn, CMA and reviewed by me for thoroughness and accuracy.         Trinna Post, PA-C  [facility]

## 2018-02-07 NOTE — Patient Instructions (Signed)

## 2018-02-08 ENCOUNTER — Other Ambulatory Visit: Payer: Self-pay

## 2018-02-08 DIAGNOSIS — R05 Cough: Secondary | ICD-10-CM

## 2018-02-08 DIAGNOSIS — R062 Wheezing: Secondary | ICD-10-CM

## 2018-02-08 DIAGNOSIS — R059 Cough, unspecified: Secondary | ICD-10-CM

## 2018-02-08 MED ORDER — BUDESONIDE-FORMOTEROL FUMARATE 160-4.5 MCG/ACT IN AERO: INHALATION_SPRAY | RESPIRATORY_TRACT | 12 refills | Status: DC

## 2018-02-08 NOTE — Telephone Encounter (Signed)
Patient called requesting refills on inhaler. [facility]. Thanks!

## 2018-02-11 ENCOUNTER — Telehealth: Payer: Self-pay | Admitting: Physician Assistant

## 2018-02-11 NOTE — Telephone Encounter (Signed)
Patient advised as below. Patient verbalizes understanding and is in agreement with treatment plan.  

## 2018-02-11 NOTE — Telephone Encounter (Signed)
I am not aware of any interaction between prednisone and buspar. Prednisone can cause bone loss and bone weakening over the long term, but if we feel the benefits outweigh the risk, ie to help with airway inflammation, she can have it for a short time. She can continue with her inhaler, she did only have very slight wheezing on her exam and I think her inhaler was doing well to control it.

## 2018-02-11 NOTE — Telephone Encounter (Signed)
Please Review

## 2018-02-11 NOTE — Telephone Encounter (Signed)
Patient is not any better.  She has not taken the Prednisone as you prescribed because it has an interaction with Buproprion and she has osteoporosis.  She wants to know should she take this or get something else called in.

## 2018-02-25 ENCOUNTER — Encounter: Payer: Self-pay | Admitting: Family Medicine

## 2018-02-25 ENCOUNTER — Ambulatory Visit (INDEPENDENT_AMBULATORY_CARE_PROVIDER_SITE_OTHER): Payer: Medicare HMO | Admitting: Family Medicine

## 2018-02-25 VITALS — BP 124/79 | HR 72 | Temp 97.9°F | Resp 16 | Wt 136.0 lb

## 2018-02-25 DIAGNOSIS — J40 Bronchitis, not specified as acute or chronic: Secondary | ICD-10-CM | POA: Diagnosis not present

## 2018-02-25 DIAGNOSIS — E785 Hyperlipidemia, unspecified: Secondary | ICD-10-CM

## 2018-02-25 DIAGNOSIS — R053 Chronic cough: Secondary | ICD-10-CM

## 2018-02-25 DIAGNOSIS — R05 Cough: Secondary | ICD-10-CM

## 2018-02-25 NOTE — Progress Notes (Signed)
Patient: Laura Love Female    DOB: 09-05-1940   77 y.o.   MRN: [id number] Visit Date: 02/25/2018  Today's Provider: [name], MD   Chief Complaint  Patient presents with  . Cough   Subjective:     Cough  This is a new problem. Episode onset: 3 weeks ago; seen in the office on 02/07/2018 for bronchitis and treated with Prednisone. The problem has been rapidly improving. The cough is non-productive. Pertinent negatives include no chest pain, chills, fever or shortness of breath. Treatments tried: Mucinex and Prednisone. The treatment provided mild relief.   She does report cough flares up when she lays down at night which he states has been more of a problem since onset of URI sx above, however her husband states she has been waking up at night coughing for a long time. She denies any dyspnea, chills, fevers or sweats.   She also needs follow up lipids.  Lab Results  Component Value Date   CHOL 280 (H) 08/22/2017   HDL 66 08/22/2017   LDLCALC 166 (H) 08/22/2017   LDLDIRECT 159.3 10/24/2010   TRIG 238 (H) 08/22/2017   CHOLHDL 4.2 08/22/2017   She was prescribed ezetimibe since last lipid check due to intolerance to statin, but states ezetemibe upsets her stomach, so has only been taking it 2-3 times a week.   Allergies  Allergen Reactions  . Crestor [Rosuvastatin Calcium]      Current Outpatient Medications:  .  albuterol (PROVENTIL HFA;VENTOLIN HFA) 108 (90 Base) MCG/ACT inhaler, Inhale 2 puffs into the lungs every 6 (six) hours as needed for wheezing or shortness of breath., Disp: 1 Inhaler, Rfl: 2 .  Biotin 1000 MCG tablet, Take 1,000 mcg by mouth daily.  , Disp: , Rfl:  .  budesonide-formoterol (SYMBICORT) 160-4.5 MCG/ACT inhaler, 1-2 puffs twice daily, Disp: 1 Inhaler, Rfl: 12 .  buPROPion (WELLBUTRIN SR) 150 MG 12 hr tablet, TAKE 1 TABLET BY MOUTH TWICE DAILY, Disp: 60 tablet, Rfl: 12 .  CALCIUM PO, Take 500 mg by mouth., Disp: , Rfl:  .  dicyclomine  (BENTYL) 10 MG capsule, Take 1 capsule (10 mg total) by mouth 4 (four) times daily -  before meals and at bedtime. As needed for abdominal pain, Disp: 100 capsule, Rfl: 3 .  ezetimibe (ZETIA) 10 MG tablet, Take 1 tablet (10 mg total) by mouth daily. (Patient taking differently: Take 10 mg by mouth 2 (two) times a week. ), Disp: 90 tablet, Rfl: 4 .  HYDROcodone-homatropine (HYCODAN) 5-1.5 MG/5ML syrup, Take 5 mLs by mouth every 8 (eight) hours as needed., Disp: 100 mL, Rfl: 0 .  ibuprofen (ADVIL,MOTRIN) 200 MG tablet, Take 200 mg by mouth every 6 (six) hours as needed. 2 tablets every 3 days , Disp: , Rfl:  .  meclizine (ANTIVERT) 25 MG tablet, Take 1 tablet (25 mg total) by mouth 3 (three) times daily as needed for dizziness., Disp: 30 tablet, Rfl: 0 .  Melatonin 3 MG TABS, Take 1 tablet by mouth at bedtime., Disp: , Rfl:  .  sennosides-docusate sodium (SENOKOT-S) 8.6-50 MG tablet, Take 2-3 tablets by mouth 2 (two) times daily. 3 tablets every morning, then 3-4 tablets in the evenings, Disp: , Rfl:  .  SYNTHROID 100 MCG tablet, Take 1 tablet (100 mcg total) by mouth daily., Disp: 90 tablet, Rfl: 3 .  vitamin C (ASCORBIC ACID) 500 MG tablet, Take 500 mg by mouth daily.  , Disp: , Rfl:  .  Vitamin D, Cholecalciferol, 1000 UNITS TABS, Take 2 tablets by mouth daily., Disp: , Rfl:  .  Zinc 50 MG CAPS, Take by mouth., Disp: , Rfl:  .  zinc gluconate 50 MG tablet, Take 50 mg by mouth daily as needed.  , Disp: , Rfl:   Review of Systems  Constitutional: Negative for appetite change, chills, fatigue and fever.  Respiratory: Positive for cough (dry ). Negative for chest tightness and shortness of breath.   Cardiovascular: Negative for chest pain and palpitations.  Gastrointestinal: Negative for abdominal pain, nausea and vomiting.  Musculoskeletal: Positive for arthralgias (right knee).  Neurological: Negative for dizziness and weakness.    Social History   Tobacco Use  . Smoking status: Former Research scientist (life sciences)   . Smokeless tobacco: Never Used  Substance Use Topics  . Alcohol use: No    Alcohol/week: 0.0 standard drinks      Objective:   BP 124/79 (BP Location: Left Arm, Patient Position: Sitting, Cuff Size: Normal)   Pulse 72   Temp 97.9 F (36.6 C) (Oral)   Resp 16   Wt 136 lb (61.7 kg)   SpO2 97% Comment: room air  BMI 24.87 kg/m  Vitals:   02/25/18 1500  BP: 124/79  Pulse: 72  Resp: 16  Temp: 97.9 F (36.6 C)  TempSrc: Oral  SpO2: 97%  Weight: 136 lb (61.7 kg)     Physical Exam   General Appearance:    Alert, cooperative, no distress  Eyes:    PERRL, conjunctiva/corneas clear, EOM's intact       Lungs:     Clear to auscultation bilaterally, respirations unlabored  Heart:    Regular rate and rhythm  Neurologic:   Awake, alert, oriented x 3. No apparent focal neurological           defect.          Assessment & Plan    1. Bronchitis Resolved.   2. Chronic cough Per husband wakes up just about every night with cough. She uses maintenance inhaler inconsistently and may need to start using every day. Also discussed association of GERD with positional cough. Will try Prilosec OTC for a few weeks and see if that helps.   3. Hyperlipidemia, unspecified hyperlipidemia type Intolerant to rosuvastatin, and only tolerating ezetimibe a few times per week. Check - Lipid panel     [name], MD  [facility]

## 2018-02-25 NOTE — Patient Instructions (Addendum)
.   Please bring all of your medications to every appointment so we can make sure that our medication list is the same as yours.    The CDC recommends two doses of Shingrix (the shingles vaccine) separated by 2 to 6 months for adults age 77 years and older. I recommend checking with your insurance plan regarding coverage for this vaccine.    Your night time cough could be due to acid reflux. Try taking a 14 day course of Prilosec OTC to see if this will help the cough

## 2018-03-01 DIAGNOSIS — E785 Hyperlipidemia, unspecified: Secondary | ICD-10-CM | POA: Diagnosis not present

## 2018-03-02 LAB — LIPID PANEL
CHOL/HDL RATIO: 3.5 ratio (ref 0.0–4.4)
Cholesterol, Total: 237 mg/dL — ABNORMAL HIGH (ref 100–199)
HDL: 68 mg/dL (ref >39)
LDL CALC: 148 mg/dL — AB (ref 0–99)
Triglycerides: 107 mg/dL (ref 0–149)
VLDL CHOLESTEROL CAL: 21 mg/dL (ref 5–40)

## 2018-03-04 ENCOUNTER — Telehealth: Payer: Self-pay

## 2018-03-04 NOTE — Telephone Encounter (Signed)
-----   Message from Birdie Sons, MD sent at 03/03/2018  3:17 PM EST ----- Cholesterol still a little high, but much better, down from 280 to 237. Continue ezetimibe as tolerated.

## 2018-03-04 NOTE — Telephone Encounter (Signed)
Pt advised.   Thanks,   -Laura  

## 2018-03-08 ENCOUNTER — Other Ambulatory Visit: Payer: Self-pay | Admitting: Family Medicine

## 2018-03-08 DIAGNOSIS — Z1231 Encounter for screening mammogram for malignant neoplasm of breast: Secondary | ICD-10-CM

## 2018-03-13 DIAGNOSIS — M810 Age-related osteoporosis without current pathological fracture: Secondary | ICD-10-CM | POA: Diagnosis not present

## 2018-03-20 DIAGNOSIS — M810 Age-related osteoporosis without current pathological fracture: Secondary | ICD-10-CM | POA: Diagnosis not present

## 2018-03-27 ENCOUNTER — Ambulatory Visit: Payer: Self-pay | Admitting: Physician Assistant

## 2018-03-27 ENCOUNTER — Encounter: Payer: Self-pay | Admitting: Family Medicine

## 2018-03-27 ENCOUNTER — Ambulatory Visit (INDEPENDENT_AMBULATORY_CARE_PROVIDER_SITE_OTHER): Payer: Medicare HMO | Admitting: Family Medicine

## 2018-03-27 VITALS — BP 133/78 | HR 63 | Temp 97.3°F | Wt 137.8 lb

## 2018-03-27 DIAGNOSIS — H579 Unspecified disorder of eye and adnexa: Secondary | ICD-10-CM

## 2018-03-27 NOTE — Progress Notes (Signed)
Patient: Laura Love Female    DOB: 05-Jan-1941   78 y.o.   MRN: [id number] Visit Date: 03/27/2018  Today's Provider: Lavon Paganini, MD   Chief Complaint  Patient presents with  . Eye Problem   Subjective:    I, Tiburcio Pea, CMA, am acting as a scribe for Lavon Paganini, MD.    Eye Problem   The right eye is affected. This is a new problem. The current episode started yesterday. The problem occurs constantly. The problem has been gradually worsening. There was no injury mechanism. She does not wear contacts. Associated symptoms include eye redness. Pertinent negatives include no blurred vision, eye discharge or double vision. She has tried eye drops and water for the symptoms. The treatment provided no relief.    Allergies  Allergen Reactions  . Crestor [Rosuvastatin Calcium]      Current Outpatient Medications:  .  albuterol (PROVENTIL HFA;VENTOLIN HFA) 108 (90 Base) MCG/ACT inhaler, Inhale 2 puffs into the lungs every 6 (six) hours as needed for wheezing or shortness of breath., Disp: 1 Inhaler, Rfl: 2 .  Biotin 1000 MCG tablet, Take 1,000 mcg by mouth daily.  , Disp: , Rfl:  .  budesonide-formoterol (SYMBICORT) 160-4.5 MCG/ACT inhaler, 1-2 puffs twice daily, Disp: 1 Inhaler, Rfl: 12 .  buPROPion (WELLBUTRIN SR) 150 MG 12 hr tablet, TAKE 1 TABLET BY MOUTH TWICE DAILY, Disp: 60 tablet, Rfl: 12 .  CALCIUM PO, Take 500 mg by mouth., Disp: , Rfl:  .  dicyclomine (BENTYL) 10 MG capsule, Take 1 capsule (10 mg total) by mouth 4 (four) times daily -  before meals and at bedtime. As needed for abdominal pain, Disp: 100 capsule, Rfl: 3 .  ezetimibe (ZETIA) 10 MG tablet, Take 1 tablet (10 mg total) by mouth daily. (Patient taking differently: Take 10 mg by mouth 2 (two) times a week. ), Disp: 90 tablet, Rfl: 4 .  HYDROcodone-homatropine (HYCODAN) 5-1.5 MG/5ML syrup, Take 5 mLs by mouth every 8 (eight) hours as needed., Disp: 100 mL, Rfl: 0 .  ibuprofen (ADVIL,MOTRIN) 200 MG  tablet, Take 200 mg by mouth every 6 (six) hours as needed. 2 tablets every 3 days , Disp: , Rfl:  .  meclizine (ANTIVERT) 25 MG tablet, Take 1 tablet (25 mg total) by mouth 3 (three) times daily as needed for dizziness., Disp: 30 tablet, Rfl: 0 .  Melatonin 3 MG TABS, Take 1 tablet by mouth at bedtime., Disp: , Rfl:  .  sennosides-docusate sodium (SENOKOT-S) 8.6-50 MG tablet, Take 2-3 tablets by mouth 2 (two) times daily. 3 tablets every morning, then 3-4 tablets in the evenings, Disp: , Rfl:  .  SYNTHROID 100 MCG tablet, Take 1 tablet (100 mcg total) by mouth daily., Disp: 90 tablet, Rfl: 3 .  vitamin C (ASCORBIC ACID) 500 MG tablet, Take 500 mg by mouth daily.  , Disp: , Rfl:  .  Vitamin D, Cholecalciferol, 1000 UNITS TABS, Take 2 tablets by mouth daily., Disp: , Rfl:  .  Zinc 50 MG CAPS, Take by mouth., Disp: , Rfl:  .  zinc gluconate 50 MG tablet, Take 50 mg by mouth daily as needed.  , Disp: , Rfl:   Review of Systems  Constitutional: Negative.   Eyes: Positive for pain and redness. Negative for blurred vision, double vision and discharge.  Respiratory: Negative.   Cardiovascular: Negative.   Musculoskeletal: Negative.     Social History   Tobacco Use  . Smoking status:  Former Smoker  . Smokeless tobacco: Never Used  Substance Use Topics  . Alcohol use: No    Alcohol/week: 0.0 standard drinks      Objective:   BP 133/78 (BP Location: Right Arm, Patient Position: Sitting, Cuff Size: Normal)   Pulse 63   Temp (!) 97.3 F (36.3 C) (Oral)   Wt 137 lb 12.8 oz (62.5 kg)   SpO2 99%   BMI 25.20 kg/m  Vitals:   03/27/18 1119  BP: 133/78  Pulse: 63  Temp: (!) 97.3 F (36.3 C)  TempSrc: Oral  SpO2: 99%  Weight: 137 lb 12.8 oz (62.5 kg)     Physical Exam Vitals signs reviewed.  Constitutional:      General: She is not in acute distress.    Appearance: Normal appearance. She is not diaphoretic.  HENT:     Head: Normocephalic and atraumatic.     Right Ear: External  ear normal.     Left Ear: External ear normal.     Nose: Nose normal.     Mouth/Throat:     Pharynx: Oropharynx is clear.  Eyes:     General: Lids are everted, no foreign bodies appreciated. Vision grossly intact. No scleral icterus.       Right eye: No foreign body or discharge.        Left eye: No foreign body or discharge.     Extraocular Movements: Extraocular movements intact.     Conjunctiva/sclera: Conjunctivae normal.     Pupils: Pupils are equal, round, and reactive to light.  Cardiovascular:     Rate and Rhythm: Normal rate and regular rhythm.  Pulmonary:     Effort: Pulmonary effort is normal. No respiratory distress.  Skin:    General: Skin is warm and dry.     Capillary Refill: Capillary refill takes less than 2 seconds.  Neurological:     Mental Status: She is alert and oriented to person, place, and time. Mental status is at baseline.  Psychiatric:        Mood and Affect: Mood normal.        Behavior: Behavior normal.         Assessment & Plan   1. Sensation of foreign body in eye - new problem - no foreign bodies appreciated on exam of R eye - no signs of infection - eye flushed in clinic with saline - patient to continue flushing at home and using artifical tears - if pain persists, will consider Rx of antibiotic ointment - discussed strict precautions of when to see her ophthalmologist   Return if symptoms worsen or fail to improve.   The entirety of the information documented in the History of Present Illness, Review of Systems and Physical Exam were personally obtained by me. Portions of this information were initially documented by Tiburcio Pea, CMA and reviewed by me for thoroughness and accuracy.    Virginia Crews, MD, MPH [facility] 03/27/2018 12:24 PM

## 2018-04-03 DIAGNOSIS — M1711 Unilateral primary osteoarthritis, right knee: Secondary | ICD-10-CM | POA: Diagnosis not present

## 2018-04-11 ENCOUNTER — Ambulatory Visit
Admission: RE | Admit: 2018-04-11 | Discharge: 2018-04-11 | Disposition: A | Discharge status: Home / Self Care | Payer: Medicare HMO | Source: Ambulatory Visit | Attending: Family Medicine | Admitting: Family Medicine

## 2018-04-11 DIAGNOSIS — Z1231 Encounter for screening mammogram for malignant neoplasm of breast: Secondary | ICD-10-CM | POA: Diagnosis not present

## 2018-04-17 DIAGNOSIS — R69 Illness, unspecified: Secondary | ICD-10-CM | POA: Diagnosis not present

## 2018-04-18 ENCOUNTER — Telehealth: Payer: Self-pay | Admitting: Family Medicine

## 2018-04-18 NOTE — Telephone Encounter (Signed)
I called the patient to schedule AWV with McKenzie, but she declined. VDM (DD)

## 2018-07-01 DIAGNOSIS — M1711 Unilateral primary osteoarthritis, right knee: Secondary | ICD-10-CM | POA: Diagnosis not present

## 2018-08-29 DIAGNOSIS — M810 Age-related osteoporosis without current pathological fracture: Secondary | ICD-10-CM | POA: Diagnosis not present

## 2018-10-09 DIAGNOSIS — M810 Age-related osteoporosis without current pathological fracture: Secondary | ICD-10-CM | POA: Diagnosis not present

## 2018-10-21 DIAGNOSIS — R69 Illness, unspecified: Secondary | ICD-10-CM | POA: Diagnosis not present

## 2018-11-05 ENCOUNTER — Other Ambulatory Visit: Payer: Self-pay | Admitting: Family Medicine

## 2018-11-05 ENCOUNTER — Ambulatory Visit (INDEPENDENT_AMBULATORY_CARE_PROVIDER_SITE_OTHER): Payer: Medicare HMO

## 2018-11-05 DIAGNOSIS — R062 Wheezing: Secondary | ICD-10-CM

## 2018-11-05 DIAGNOSIS — R059 Cough, unspecified: Secondary | ICD-10-CM

## 2018-11-05 DIAGNOSIS — R05 Cough: Secondary | ICD-10-CM

## 2018-11-05 DIAGNOSIS — Z23 Encounter for immunization: Secondary | ICD-10-CM | POA: Diagnosis not present

## 2018-11-05 MED ORDER — ALBUTEROL SULFATE HFA 108 (90 BASE) MCG/ACT IN AERS: 2.0000 | INHALATION_SPRAY | Freq: Four times a day (QID) | RESPIRATORY_TRACT | 6 refills | Status: DC | PRN

## 2018-11-05 NOTE — Telephone Encounter (Signed)
[facility] faxed refill request for the following medications: ° °albuterol (PROVENTIL HFA;VENTOLIN HFA) 108 (90 Base) MCG/ACT inhaler ° ° °Please advise. °

## 2018-11-05 NOTE — Telephone Encounter (Signed)
L.O.V. was 03/27/2018 and no upcoming appointment.

## 2018-11-08 ENCOUNTER — Other Ambulatory Visit: Payer: Self-pay | Admitting: Family Medicine

## 2018-11-20 DIAGNOSIS — R69 Illness, unspecified: Secondary | ICD-10-CM | POA: Diagnosis not present

## 2018-12-24 ENCOUNTER — Encounter: Payer: Self-pay | Admitting: Family Medicine

## 2018-12-24 ENCOUNTER — Ambulatory Visit (INDEPENDENT_AMBULATORY_CARE_PROVIDER_SITE_OTHER): Payer: Medicare HMO | Admitting: Family Medicine

## 2018-12-24 ENCOUNTER — Other Ambulatory Visit: Payer: Self-pay

## 2018-12-24 VITALS — BP 122/60 | HR 78 | Temp 97.1°F | Resp 16 | Wt 141.0 lb

## 2018-12-24 DIAGNOSIS — M2550 Pain in unspecified joint: Secondary | ICD-10-CM | POA: Diagnosis not present

## 2018-12-24 DIAGNOSIS — M810 Age-related osteoporosis without current pathological fracture: Secondary | ICD-10-CM

## 2018-12-24 DIAGNOSIS — F419 Anxiety disorder, unspecified: Secondary | ICD-10-CM

## 2018-12-24 DIAGNOSIS — R69 Illness, unspecified: Secondary | ICD-10-CM | POA: Diagnosis not present

## 2018-12-24 MED ORDER — DULOXETINE HCL 30 MG PO CPEP: 30.0000 mg | ORAL_CAPSULE | Freq: Every day | ORAL | 1 refills | Status: DC

## 2018-12-24 NOTE — Progress Notes (Signed)
Patient: Laura Love Female    DOB: 04-Dec-1940   78 y.o.   MRN: [id number] Visit Date: 12/24/2018  Today's Provider: [name], MD   Chief Complaint  Patient presents with  . Arthritis   Subjective:     Arthritis Presents for follow-up visit. She complains of pain and stiffness. The symptoms have been stable. Pertinent negatives include no fatigue or fever.  Patient has been taking Ibuprofen to help with pain. Has had arthritis pains in mid and low back, hands, shoulder and knees for many years and is starting to limit her activity. She is wondering if there is something she can take besides ibuprofen to help with pain.   She also reports increasing stress and difficulty completing tasks due to stress levels over the last several months. She has been on bupropion for many years, but is only taking one a day for depressions. She does not want to stop taking it.   Allergies  Allergen Reactions  . Crestor [Rosuvastatin Calcium]      Current Outpatient Medications:  .  albuterol (VENTOLIN HFA) 108 (90 Base) MCG/ACT inhaler, Inhale 2 puffs into the lungs every 6 (six) hours as needed for wheezing or shortness of breath., Disp: 18 g, Rfl: 6 .  Biotin 1000 MCG tablet, Take 1,000 mcg by mouth daily.  , Disp: , Rfl:  .  budesonide-formoterol (SYMBICORT) 160-4.5 MCG/ACT inhaler, 1-2 puffs twice daily, Disp: 1 Inhaler, Rfl: 12 .  buPROPion (WELLBUTRIN SR) 150 MG 12 hr tablet, TAKE ONE TABLET TWICE DAILY, Disp: 60 tablet, Rfl: 12 (TAKING DIFFERENTLY, TAKE ONE TABLET ONCE A DAY) .  CALCIUM PO, Take 500 mg by mouth., Disp: , Rfl:  .  dicyclomine (BENTYL) 10 MG capsule, Take 1 capsule (10 mg total) by mouth 4 (four) times daily -  before meals and at bedtime. As needed for abdominal pain, Disp: 100 capsule, Rfl: 3 .  ezetimibe (ZETIA) 10 MG tablet, Take 1 tablet (10 mg total) by mouth daily. (Patient taking differently: Take 10 mg by mouth 2 (two) times a week. ), Disp: 90 tablet,  Rfl: 4 .  ibuprofen (ADVIL,MOTRIN) 200 MG tablet, Take 200 mg by mouth every 6 (six) hours as needed. 2 tablets every 3 days , Disp: , Rfl:  .  meclizine (ANTIVERT) 25 MG tablet, Take 1 tablet (25 mg total) by mouth 3 (three) times daily as needed for dizziness., Disp: 30 tablet, Rfl: 0 .  Melatonin 3 MG TABS, Take 1 tablet by mouth at bedtime., Disp: , Rfl:  .  sennosides-docusate sodium (SENOKOT-S) 8.6-50 MG tablet, Take 2-3 tablets by mouth 2 (two) times daily. 3 tablets every morning, then 3-4 tablets in the evenings, Disp: , Rfl:  .  SYNTHROID 100 MCG tablet, Take 1 tablet (100 mcg total) by mouth daily., Disp: 90 tablet, Rfl: 3 .  vitamin C (ASCORBIC ACID) 500 MG tablet, Take 500 mg by mouth daily.  , Disp: , Rfl:  .  Vitamin D, Cholecalciferol, 1000 UNITS TABS, Take 2 tablets by mouth daily., Disp: , Rfl:  .  Zinc 50 MG CAPS, Take by mouth., Disp: , Rfl:  .  zinc gluconate 50 MG tablet, Take 50 mg by mouth daily as needed.  , Disp: , Rfl:  .  Reclast 5mg  yearly  Review of Systems  Constitutional: Negative for appetite change, chills, fatigue and fever.  Respiratory: Negative for chest tightness and shortness of breath.   Cardiovascular: Negative for chest pain  and palpitations.  Gastrointestinal: Negative for abdominal pain, nausea and vomiting.  Musculoskeletal: Positive for arthralgias, arthritis, back pain and stiffness.  Neurological: Negative for dizziness and weakness.    Social History   Tobacco Use  . Smoking status: Former Research scientist (life sciences)  . Smokeless tobacco: Never Used  Substance Use Topics  . Alcohol use: No    Alcohol/week: 0.0 standard drinks      Objective:   BP 122/60 (BP Location: Left Arm, Patient Position: Sitting, Cuff Size: Normal)   Pulse 78   Temp (!) 97.1 F (36.2 C) (Temporal)   Resp 16   Wt 141 lb (64 kg)   BMI 25.79 kg/m  Vitals:   12/24/18 1445  BP: 122/60  Pulse: 78  Resp: 16  Temp: (!) 97.1 F (36.2 C)  TempSrc: Temporal  Weight: 141 lb (64  kg)  Body mass index is 25.79 kg/m.   Physical Exam   General Appearance:    Well developed, well nourished female in no acute distress  Eyes:    PERRL, conjunctiva/corneas clear, EOM's intact       Lungs:     Clear to auscultation bilaterally, respirations unlabored  Heart:    Normal heart rate. Normal rhythm. No murmurs, rubs, or gallops.   MS:   All extremities are intact. Slight swelling in joints of IP s. No redness or gross deformities.   Neurologic:   Awake, alert, oriented x 3. No apparent focal neurological           defect.          Assessment & Plan    1. Arthralgia, unspecified joint Likely osteoarthritis. Considering she is also needing something to help anxiety, we may find that duloxetine is helpful for both problems. Will add low dose- DULoxetine (CYMBALTA) 30 MG capsule; Take 1 capsule (30 mg total) by mouth daily.  Dispense: 30 capsule; Refill: 1 check - Rheumatoid Factor - Cyclic citrul peptide antibody, IgG  2. Anxiety add- DULoxetine (CYMBALTA) 30 MG capsule; Take 1 capsule (30 mg total) by mouth daily.  Dispense: 30 capsule; Refill: 1  Future Appointments  Date Time Provider [place]  01/21/2019  2:40 PM Birdie Sons, MD [facility] None    The entirety of the information documented in the History of Present Illness, Review of Systems and Physical Exam were personally obtained by me. Portions of this information were initially documented by Idelle Jo, CMA and reviewed by me for thoroughness and accuracy.     [name], MD  [facility]

## 2018-12-24 NOTE — Patient Instructions (Signed)
.   Please review the attached list of medications and notify my office if there are any errors.   . Please bring all of your medications to every appointment so we can make sure that our medication list is the same as yours.   . It is especially important to get the annual flu vaccine this year. If you haven't had it already, please go to your pharmacy or call the office as soon as possible to schedule you flu shot.  

## 2018-12-25 NOTE — Addendum Note (Signed)
Addended by: Birdie Sons on: 12/25/2018 12:59 PM   Modules accepted: Orders

## 2018-12-27 ENCOUNTER — Other Ambulatory Visit: Payer: Self-pay | Admitting: Family Medicine

## 2018-12-27 DIAGNOSIS — M2550 Pain in unspecified joint: Secondary | ICD-10-CM | POA: Diagnosis not present

## 2018-12-31 ENCOUNTER — Telehealth: Payer: Self-pay

## 2018-12-31 ENCOUNTER — Other Ambulatory Visit: Payer: Self-pay | Admitting: Family Medicine

## 2018-12-31 LAB — RHEUMATOID FACTOR: Rheumatoid fact SerPl-aCnc: <10 IU/mL (ref 0.0–13.9)

## 2018-12-31 LAB — CYCLIC CITRUL PEPTIDE ANTIBODY, IGG/IGA: Cyclic Citrullin Peptide Ab: 5 units (ref 0–19)

## 2018-12-31 NOTE — Telephone Encounter (Addendum)
Patient states she started DULoxetine (CYMBALTA) 30 MG capsule and it makes her very nauseated. She states she was nauseated for approximately 18 hours. She stopped the medication and nausea subsided. Patient is very anxious about trying a different medication at this time, but she states if Dr. Caryn Section feels she needs a different medication she will try it. She is taking Ibuprofen for the arthralgias at this time with relief.   Patient has a 1 month follow up scheduled. She wants to know if she should keep that appointment. Please advise.

## 2018-12-31 NOTE — Telephone Encounter (Signed)
If the ibuprofen is helping she can stick with that. If she wants to try something else, then let me know. We could try gabapentin or tramadol

## 2018-12-31 NOTE — Telephone Encounter (Signed)
Patient advised. She states she will continue Ibuprofen.

## 2019-01-21 ENCOUNTER — Ambulatory Visit: Payer: Self-pay | Admitting: Family Medicine

## 2019-03-11 ENCOUNTER — Ambulatory Visit: Payer: Self-pay | Admitting: Family Medicine

## 2019-03-20 NOTE — Progress Notes (Signed)
Subjective:   Laura Love is a 79 y.o. female who presents for Medicare Annual (Subsequent) preventive examination.     This visit is being conducted through telemedicine due to the COVID-19 pandemic. This patient has given me verbal consent via doximity to conduct this visit, patient states they are participating from their home address. Some vital signs may be absent or patient reported.    Patient identification: identified by name, DOB, and current address  Review of Systems:  N/A  Cardiac Risk Factors include: advanced age (>52men, >53 women);dyslipidemia     Objective:     Vitals: There were no vitals taken for this visit.  There is no height or weight on file to calculate BMI. Unable to obtain vitals due to visit being conducted via telephonically.   Advanced Directives 03/24/2019  Does Patient Have a Medical Advance Directive? Yes  Type of Paramedic of [place];Living will  Copy of [place] in Chart? No - copy requested    Tobacco Social History   Tobacco Use  Smoking Status Former Smoker  . Types: Cigarettes  Smokeless Tobacco Never Used  Tobacco Comment   quit in 1969     Counseling given: Not Answered Comment: quit in 1969   Clinical Intake:  Pre-visit preparation completed: Yes  Pain : No/denies pain Pain Score: 0-No pain     Nutritional Risks: None Diabetes: No  How often do you need to have someone help you when you read instructions, pamphlets, or other written materials from your doctor or pharmacy?: 1 - Never  Interpreter Needed?: No  Information entered by :: [facility], LPN  Past Medical History:  Diagnosis Date  . Clavicular fracture   . Colon polyps   . History of chicken pox   . History of measles   . History of mumps   . Hyperlipidemia   . Hypoglycemia   . Osteoporosis    Past Surgical History:  Procedure Laterality Date  . ABDOMINAL HYSTERECTOMY  1988   for endometriosis    . BREAST BIOPSY Right 2004  . CLOSED REDUCTION FACIAL FRACTURE     Family History  Problem Relation Age of Onset  . Heart attack Father   . Leukemia Father   . Hyperlipidemia Mother   . Hypertension Mother   . Breast cancer Mother   . Hyperlipidemia Sister   . Breast cancer Sister   . Hyperlipidemia Brother    Social History   Socioeconomic History  . Marital status: Married    Spouse name: Not on file  . Number of children: 2  . Years of education: Not on file  . Highest education level: Associate degree: occupational, Hotel manager, or vocational program  Occupational History  . Occupation: Retired  Tobacco Use  . Smoking status: Former Smoker    Types: Cigarettes  . Smokeless tobacco: Never Used  . Tobacco comment: quit in 1969  Substance and Sexual Activity  . Alcohol use: No    Alcohol/week: 0.0 standard drinks  . Drug use: No  . Sexual activity: Not on file  Other Topics Concern  . Not on file  Social History Narrative   Lives with husband in [place], [place]. Raised horses.   Social Determinants of Health   Financial Resource Strain: Low Risk   . Difficulty of Paying Living Expenses: Not hard at all  Food Insecurity: No Food Insecurity  . Worried About Charity fundraiser in the Last Year: Never true  . Ran  Out of Food in the Last Year: Never true  Transportation Needs: No Transportation Needs  . Lack of Transportation (Medical): No  . Lack of Transportation (Non-Medical): No  Physical Activity: Inactive  . Days of Exercise per Week: 0 days  . Minutes of Exercise per Session: 0 min  Stress: Stress Concern Present  . Feeling of Stress : Rather much  Social Connections: Slightly Isolated  . Frequency of Communication with Friends and Family: More than three times a week  . Frequency of Social Gatherings with Friends and Family: Never  . Attends Religious Services: More than 4 times per year  . Active Member of Clubs or Organizations: No  . Attends Theatre manager Meetings: Never  . Marital Status: Married    Outpatient Encounter Medications as of 03/24/2019  Medication Sig  . albuterol (VENTOLIN HFA) 108 (90 Base) MCG/ACT inhaler Inhale 2 puffs into the lungs every 6 (six) hours as needed for wheezing or shortness of breath.  . Biotin 1000 MCG tablet Take 1,000 mcg by mouth daily.    . budesonide-formoterol (SYMBICORT) 160-4.5 MCG/ACT inhaler 1-2 puffs twice daily (Patient taking differently: 1-2 puffs twice daily as needed)  . buPROPion (WELLBUTRIN SR) 150 MG 12 hr tablet TAKE ONE TABLET TWICE DAILY (Patient taking differently: Only takes 1 a day currently)  . CALCIUM PO Take 600 mg by mouth daily. + D3 800 units  . DULoxetine (CYMBALTA) 30 MG capsule Take 1 capsule (30 mg total) by mouth daily.  Marland Kitchen ezetimibe (ZETIA) 10 MG tablet Take 1 tablet (10 mg total) by mouth daily. (Patient taking differently: Take 10 mg by mouth 2 (two) times a week. )  . ibuprofen (ADVIL,MOTRIN) 200 MG tablet Take 200 mg by mouth every 6 (six) hours as needed. 2 tablets every 3 days   . meclizine (ANTIVERT) 25 MG tablet Take 1 tablet (25 mg total) by mouth 3 (three) times daily as needed for dizziness.  . Melatonin 3 MG TABS Take 1 tablet by mouth at bedtime.  . sennosides-docusate sodium (SENOKOT-S) 8.6-50 MG tablet Take 2-3 tablets by mouth 2 (two) times daily. 3 tablets every morning, then 3-4 tablets in the evenings  . SYNTHROID 100 MCG tablet TAKE 1 TABLET BY MOUTH DAILY (Patient taking differently: Takes 1/2 tablet daily)  . zinc gluconate 50 MG tablet Take 50 mg by mouth daily as needed.    . zoledronic acid (RECLAST) 5 MG/100ML SOLN injection Inject 5 mg into the vein. Once yearly starting June 2019 per Dr. Gabriel Carina  . dicyclomine (BENTYL) 10 MG capsule Take 1 capsule (10 mg total) by mouth 4 (four) times daily -  before meals and at bedtime. As needed for abdominal pain (Patient not taking: Reported on 03/24/2019)  . vitamin C (ASCORBIC ACID) 500 MG tablet Take  500 mg by mouth daily.    . Vitamin D, Cholecalciferol, 1000 UNITS TABS Take 2 tablets by mouth daily.  . Zinc 50 MG CAPS Take by mouth.   No facility-administered encounter medications on file as of 03/24/2019.    Activities of Daily Living In your present state of health, do you have any difficulty performing the following activities: 03/24/2019  Hearing? N  Vision? N  Difficulty concentrating or making decisions? N  Walking or climbing stairs? N  Dressing or bathing? Y  Comment Due to osteoporosis pains.  Doing errands, shopping? N  Preparing Food and eating ? N  Using the Toilet? N  In the past six months, have you  accidently leaked urine? N  Do you have problems with loss of bowel control? N  Managing your Medications? N  Managing your Finances? N  Housekeeping or managing your Housekeeping? N  Some recent data might be hidden    Patient Care Team: Birdie Sons, MD as PCP - General (Family Medicine) Thelma Comp, MD as Referring Physician (Gastroenterology) Gabriel Carina Betsey Holiday, MD as Physician Assistant (Endocrinology) Lattie Corns, PA-C as Physician Assistant (Physician Assistant) Marlowe Sax, MD as Referring Physician (Internal Medicine)    Assessment:   This is a routine wellness examination for [place].  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 Prevent falls     Recommend to remove any items from the home that may cause slips or trips.       Fall Risk: Fall Risk  03/24/2019 04/13/2017 02/10/2016 12/21/2014  Falls in the past year? 1 Yes No No  Number falls in past yr: 0 2 or more - -  Comment - 3-4 - -  Injury with Fall? 0 Yes - -  Follow up Falls prevention discussed - - -    FALL RISK PREVENTION PERTAINING TO THE HOME:  Any stairs in or around the  home? No  If so, are there any without handrails? N/A  Home free of loose throw rugs in walkways, pet beds, electrical cords, etc? Yes  Adequate lighting in your home to reduce risk of falls? Yes   ASSISTIVE DEVICES UTILIZED TO PREVENT FALLS:  Life alert? No  Use of a cane, walker or w/c? No  Grab bars in the bathroom? Yes  Shower chair or bench in shower? Yes  Elevated toilet seat or a handicapped toilet? Yes    TIMED UP AND GO:  Was the test performed? No .    Depression Screen PHQ 2/9 Scores 03/24/2019 03/24/2019 03/24/2019 04/13/2017  PHQ - 2 Score 1 1 0 0  PHQ- 9 Score - - - 1     Cognitive Function: Declined today.        Immunization History  Administered Date(s) Administered  . Fluad Quad(high Dose 65+) 11/05/2018  . H1N1 12/21/2007  . Influenza Split 12/30/2008, 12/11/2009, 12/22/2010  . Influenza, High Dose Seasonal PF 12/30/2014, 12/02/2015, 10/25/2016, 11/20/2017  . Influenza,inj,quad, With Preservative 11/27/2016  . Pneumococcal Conjugate-13 12/02/2015  . Pneumococcal Polysaccharide-23 01/03/2008  . Td 05/06/2007  . Tdap 11/17/2011    Qualifies for Shingles Vaccine? Yes . Due for Shingrix. Pt has been advised to call insurance company to determine out of pocket expense. Advised may also receive vaccine at local pharmacy or Health Dept. Verbalized acceptance and understanding.  Tdap: Up to date  Flu Vaccine: Up to date  Pneumococcal Vaccine: Completed series   Screening Tests Health Maintenance  Topic Date Due  . DEXA SCAN  04/11/2019  . COLONOSCOPY  01/15/2021  . TETANUS/TDAP  11/16/2021  . INFLUENZA VACCINE  Completed  . PNA vac Low Risk Adult  Completed    Cancer Screenings:  Colorectal Screening: Completed 01/15/18. Repeat every 3 years.   Mammogram: No longer required.   Bone Density: Completed 04/10/17. Results reflect OSTEOPOROSIS. Repeat every 2 years. O  Lung Cancer Screening: (Low Dose CT Chest recommended if Age 59-80 years, 30  pack-year currently smoking OR have quit w/in 15years.) does not qualify.   Additional Screening:  Vision Screening: Recommended annual  ophthalmology exams for early detection of glaucoma and other disorders of the eye.  Dental Screening: Recommended annual dental exams for proper oral hygiene  Community Resource Referral:  CRR required this visit?  No       Plan:  I have personally reviewed and addressed the Medicare Annual Wellness questionnaire and have noted the following in the patient's chart:  A. Medical and social history B. Use of alcohol, tobacco or illicit drugs  C. Current medications and supplements D. Functional ability and status E.  Nutritional status F.  Physical activity G. Advance directives H. List of other physicians I.  Hospitalizations, surgeries, and ER visits in previous 12 months J.  [place] such as hearing and vision if needed, cognitive and depression L. Referrals and appointments   In addition, I have reviewed and discussed with patient certain preventive protocols, quality metrics, and best practice recommendations. A written personalized care plan for preventive services as well as general preventive health recommendations were provided to patient. Nurse Health Advisor  Signed,    Kenedie Dirocco [place], [place]  [id number] Nurse Health Advisor   Nurse Notes: None.

## 2019-03-24 ENCOUNTER — Telehealth: Payer: Self-pay

## 2019-03-24 ENCOUNTER — Other Ambulatory Visit: Payer: Self-pay

## 2019-03-24 ENCOUNTER — Encounter: Payer: Medicare HMO | Admitting: Family Medicine

## 2019-03-24 ENCOUNTER — Ambulatory Visit (INDEPENDENT_AMBULATORY_CARE_PROVIDER_SITE_OTHER): Payer: Medicare HMO

## 2019-03-24 DIAGNOSIS — Z0000 Encounter for general adult medical examination without abnormal findings: Secondary | ICD-10-CM

## 2019-03-24 DIAGNOSIS — E785 Hyperlipidemia, unspecified: Secondary | ICD-10-CM

## 2019-03-24 DIAGNOSIS — E039 Hypothyroidism, unspecified: Secondary | ICD-10-CM

## 2019-03-24 NOTE — Telephone Encounter (Signed)
That's fine, please order fasting lipid pane, met c and tsh for hyperlipidemia and hypothyroid. Thanks.

## 2019-03-24 NOTE — Telephone Encounter (Signed)
Patient was advised that Dr.Fisher ok her to come in and have fasting labs drawn. Labs printed and left up front.

## 2019-03-24 NOTE — Patient Instructions (Addendum)
Laura Love , Thank you for taking time to come for your Medicare Wellness Visit. I appreciate your ongoing commitment to your health goals. Please review the following plan we discussed and let me know if I can assist you in the future.   Screening recommendations/referrals: Colonoscopy: Up to date, due 12/2020 Mammogram: No longer required.  Bone Density: Up to date, due 03/2019 Recommended yearly ophthalmology/optometry visit for glaucoma screening and checkup Recommended yearly dental visit for hygiene and checkup  Vaccinations: Influenza vaccine: Up to date Pneumococcal vaccine: Completed series Tdap vaccine: Up to date, due 10/2021 Shingles vaccine: Pt declines today.     Advanced directives: Please bring a copy of your POA (Power of Attorney) and/or Living Will to your next appointment.   Conditions/risks identified: Fall risk prevention discussed today.  Next appointment: 06/25/19 @ 10:00 AM with Dr Caryn Section.    Preventive Care 79 Years and Older, Female Preventive care refers to lifestyle choices and visits with your health care provider that can promote health and wellness. What does preventive care include?  A yearly physical exam. This is also called an annual well check.  Dental exams once or twice a year.  Routine eye exams. Ask your health care provider how often you should have your eyes checked.  Personal lifestyle choices, including:  Daily care of your teeth and gums.  Regular physical activity.  Eating a healthy diet.  Avoiding tobacco and drug use.  Limiting alcohol use.  Practicing safe sex.  Taking low-dose aspirin every day.  Taking vitamin and mineral supplements as recommended by your health care provider. What happens during an annual well check? The services and screenings done by your health care provider during your annual well check will depend on your age, overall health, lifestyle risk factors, and family history of disease. Counseling    Your health care provider may ask you questions about your:  Alcohol use.  Tobacco use.  Drug use.  Emotional well-being.  Home and relationship well-being.  Sexual activity.  Eating habits.  History of falls.  Memory and ability to understand (cognition).  Work and work Statistician.  Reproductive health. Screening  You may have the following tests or measurements:  Height, weight, and BMI.  Blood pressure.  Lipid and cholesterol levels. These may be checked every 5 years, or more frequently if you are over 79 years old.  Skin check.  Lung cancer screening. You may have this screening every year starting at age 79 if you have a 30-pack-year history of smoking and currently smoke or have quit within the past 15 years.  Fecal occult blood test (FOBT) of the stool. You may have this test every year starting at age 79.  Flexible sigmoidoscopy or colonoscopy. You may have a sigmoidoscopy every 5 years or a colonoscopy every 10 years starting at age 79.  Hepatitis C blood test.  Hepatitis B blood test.  Sexually transmitted disease (STD) testing.  Diabetes screening. This is done by checking your blood sugar (glucose) after you have not eaten for a while (fasting). You may have this done every 1-3 years.  Bone density scan. This is done to screen for osteoporosis. You may have this done starting at age 79.  Mammogram. This may be done every 1-2 years. Talk to your health care provider about how often you should have regular mammograms. Talk with your health care provider about your test results, treatment options, and if necessary, the need for more tests. Vaccines  Your health care  provider may recommend certain vaccines, such as:  Influenza vaccine. This is recommended every year.  Tetanus, diphtheria, and acellular pertussis (Tdap, Td) vaccine. You may need a Td booster every 10 years.  Zoster vaccine. You may need this after age 79.  Pneumococcal 13-valent  conjugate (PCV13) vaccine. One dose is recommended after age 79.  Pneumococcal polysaccharide (PPSV23) vaccine. One dose is recommended after age 79. Talk to your health care provider about which screenings and vaccines you need and how often you need them. This information is not intended to replace advice given to you by your health care provider. Make sure you discuss any questions you have with your health care provider. Document Released: 03/12/2015 Document Revised: 11/03/2015 Document Reviewed: 12/15/2014 Elsevier Interactive Patient Education  2017 [place] Prevention in the Home Falls can cause injuries. They can happen to people of all ages. There are many things you can do to make your home safe and to help prevent falls. What can I do on the outside of my home?  Regularly fix the edges of walkways and driveways and fix any cracks.  Remove anything that might make you trip as you walk through a door, such as a raised step or threshold.  Trim any bushes or trees on the path to your home.  Use bright outdoor lighting.  Clear any walking paths of anything that might make someone trip, such as rocks or tools.  Regularly check to see if handrails are loose or broken. Make sure that both sides of any steps have handrails.  Any raised decks and porches should have guardrails on the edges.  Have any leaves, snow, or ice cleared regularly.  Use sand or salt on walking paths during winter.  Clean up any spills in your garage right away. This includes oil or grease spills. What can I do in the bathroom?  Use night lights.  Install grab bars by the toilet and in the tub and shower. Do not use towel bars as grab bars.  Use non-skid mats or decals in the tub or shower.  If you need to sit down in the shower, use a plastic, non-slip stool.  Keep the floor dry. Clean up any water that spills on the floor as soon as it happens.  Remove soap buildup in the tub or  shower regularly.  Attach bath mats securely with double-sided non-slip rug tape.  Do not have throw rugs and other things on the floor that can make you trip. What can I do in the bedroom?  Use night lights.  Make sure that you have a light by your bed that is easy to reach.  Do not use any sheets or blankets that are too big for your bed. They should not hang down onto the floor.  Have a firm chair that has side arms. You can use this for support while you get dressed.  Do not have throw rugs and other things on the floor that can make you trip. What can I do in the kitchen?  Clean up any spills right away.  Avoid walking on wet floors.  Keep items that you use a lot in easy-to-reach places.  If you need to reach something above you, use a strong step stool that has a grab bar.  Keep electrical cords out of the way.  Do not use floor polish or wax that makes floors slippery. If you must use wax, use non-skid floor wax.  Do not have throw  rugs and other things on the floor that can make you trip. What can I do with my stairs?  Do not leave any items on the stairs.  Make sure that there are handrails on both sides of the stairs and use them. Fix handrails that are broken or loose. Make sure that handrails are as long as the stairways.  Check any carpeting to make sure that it is firmly attached to the stairs. Fix any carpet that is loose or worn.  Avoid having throw rugs at the top or bottom of the stairs. If you do have throw rugs, attach them to the floor with carpet tape.  Make sure that you have a light switch at the top of the stairs and the bottom of the stairs. If you do not have them, ask someone to add them for you. What else can I do to help prevent falls?  Wear shoes that:  Do not have high heels.  Have rubber bottoms.  Are comfortable and fit you well.  Are closed at the toe. Do not wear sandals.  If you use a stepladder:  Make sure that it is fully  opened. Do not climb a closed stepladder.  Make sure that both sides of the stepladder are locked into place.  Ask someone to hold it for you, if possible.  Clearly mark and make sure that you can see:  Any grab bars or handrails.  First and last steps.  Where the edge of each step is.  Use tools that help you move around (mobility aids) if they are needed. These include:  Canes.  Walkers.  Scooters.  Crutches.  Turn on the lights when you go into a dark area. Replace any light bulbs as soon as they burn out.  Set up your furniture so you have a clear path. Avoid moving your furniture around.  If any of your floors are uneven, fix them.  If there are any pets around you, be aware of where they are.  Review your medicines with your doctor. Some medicines can make you feel dizzy. This can increase your chance of falling. Ask your doctor what other things that you can do to help prevent falls. This information is not intended to replace advice given to you by your health care provider. Make sure you discuss any questions you have with your health care provider. Document Released: 12/10/2008 Document Revised: 07/22/2015 Document Reviewed: 03/20/2014 Elsevier Interactive Patient Education  2017 [employer].

## 2019-03-24 NOTE — Telephone Encounter (Signed)
Spoke with pt this morning to complete her AWV telephonically and pt stated that she did not want to wait until April to have her blood work checked. Pt's cpe was r/s from today to 06/25/19. Pt is interested to know what her cholesterol level will be and thinks she is supposed to have a 6 month follow. Please advise, thank you.

## 2019-04-15 ENCOUNTER — Other Ambulatory Visit: Payer: Self-pay | Admitting: Family Medicine

## 2019-04-15 DIAGNOSIS — E785 Hyperlipidemia, unspecified: Secondary | ICD-10-CM

## 2019-04-28 DIAGNOSIS — R69 Illness, unspecified: Secondary | ICD-10-CM | POA: Diagnosis not present

## 2019-05-21 ENCOUNTER — Other Ambulatory Visit: Payer: Self-pay | Admitting: Family Medicine

## 2019-05-21 DIAGNOSIS — Z1231 Encounter for screening mammogram for malignant neoplasm of breast: Secondary | ICD-10-CM

## 2019-05-30 ENCOUNTER — Telehealth: Payer: Self-pay | Admitting: Family Medicine

## 2019-05-30 NOTE — Telephone Encounter (Signed)
Please call pt back with a time Dr. Caryn Section can fit her in the schedule for her CPE.  She has been scheduled 1 time since Jan 2021.  She does not want to be rescheduled to a later date for her April 28th CPE.  She has concerns and wants a sooner appt for her CPE with Dr. Caryn Section.  Please call pt back to give her an available time at [phone].  Thanks, [employer]

## 2019-05-30 NOTE — Telephone Encounter (Signed)
Dr. Caryn Section does not have any CPE alots any sooner. Can offer patient a acute slot if she needs to discuss issues. Please contact patient to advise. FYI: If Dr. Caryn Section does not have any CPE slot openings then there is none available.

## 2019-06-04 ENCOUNTER — Other Ambulatory Visit: Payer: Self-pay

## 2019-06-04 ENCOUNTER — Ambulatory Visit
Admission: RE | Admit: 2019-06-04 | Discharge: 2019-06-04 | Disposition: A | Discharge status: Home / Self Care | Payer: Medicare HMO | Source: Ambulatory Visit | Attending: Family Medicine | Admitting: Family Medicine

## 2019-06-04 DIAGNOSIS — Z1231 Encounter for screening mammogram for malignant neoplasm of breast: Secondary | ICD-10-CM

## 2019-06-10 DIAGNOSIS — E785 Hyperlipidemia, unspecified: Secondary | ICD-10-CM | POA: Diagnosis not present

## 2019-06-10 DIAGNOSIS — E039 Hypothyroidism, unspecified: Secondary | ICD-10-CM | POA: Diagnosis not present

## 2019-06-11 LAB — COMPREHENSIVE METABOLIC PANEL
ALT: 14 IU/L (ref 0–32)
AST: 16 IU/L (ref 0–40)
Albumin/Globulin Ratio: 2.2 (ref 1.2–2.2)
Albumin: 4.4 g/dL (ref 3.7–4.7)
Alkaline Phosphatase: 78 IU/L (ref 39–117)
BUN/Creatinine Ratio: 13 (ref 12–28)
BUN: 10 mg/dL (ref 8–27)
Bilirubin Total: 0.4 mg/dL (ref 0.0–1.2)
CO2: 22 mmol/L (ref 20–29)
Calcium: 9 mg/dL (ref 8.7–10.3)
Chloride: 100 mmol/L (ref 96–106)
Creatinine, Ser: 0.79 mg/dL (ref 0.57–1.00)
GFR calc Af Amer: 83 mL/min/{1.73_m2} (ref >59)
GFR calc non Af Amer: 72 mL/min/{1.73_m2} (ref >59)
Globulin, Total: 2 g/dL (ref 1.5–4.5)
Glucose: 87 mg/dL (ref 65–99)
Potassium: 4.1 mmol/L (ref 3.5–5.2)
Sodium: 137 mmol/L (ref 134–144)
Total Protein: 6.4 g/dL (ref 6.0–8.5)

## 2019-06-11 LAB — LIPID PANEL
Chol/HDL Ratio: 3.4 ratio (ref 0.0–4.4)
Cholesterol, Total: 238 mg/dL — ABNORMAL HIGH (ref 100–199)
HDL: 71 mg/dL (ref >39)
LDL Chol Calc (NIH): 148 mg/dL — ABNORMAL HIGH (ref 0–99)
Triglycerides: 110 mg/dL (ref 0–149)
VLDL Cholesterol Cal: 19 mg/dL (ref 5–40)

## 2019-06-11 LAB — TSH: TSH: 2.36 u[IU]/mL (ref 0.450–4.500)

## 2019-06-17 NOTE — Progress Notes (Signed)
Complete physical exam    Patient: Laura Love   DOB: [birth year]   79 y.o. Female  MRN: [id number] Visit Date: 06/23/2019  Today's healthcare provider: [name], MD  Subjective:    Chief Complaint  Patient presents with  . Annual Exam    Had AWV with [facility] on 03/04/2019  Laura Love is a 79 y.o. female who presents today for a complete physical exam.  She reports consuming a general diet. The patient does not participate in regular exercise at present. She generally feels fairly well. She reports sleeping well. She does have additional problems to discuss today.  HPI  Lipid/Cholesterol, Follow-up  Last lipid panel Other pertinent labs  Lab Results  Component Value Date   CHOL 238 (H) 06/10/2019   HDL 71 06/10/2019   LDLCALC 148 (H) 06/10/2019   LDLDIRECT 159.3 10/24/2010   TRIG 110 06/10/2019   CHOLHDL 3.4 06/10/2019   Lab Results  Component Value Date   ALT 14 06/10/2019   AST 16 06/10/2019   PLT 243 08/22/2017   TSH 2.360 06/10/2019     She was last seen for this more than 1 year ago.  Management since that visit includes continuing same medication.  She reports good compliance with treatment. She is not having side effects.  Symptoms: No chest pain No chest pressure/discomfort No dyspnea No lower extremity edema No numbness or tingling of extremity No orthopnea No palpitations No paroxysmal nocturnal dyspnea No speech difficulty No syncope  Current diet: well balanced Current exercise: none  Wt Readings from Last 3 Encounters:  06/23/19 145 lb (65.8 kg)  12/24/18 141 lb (64 kg)  03/27/18 137 lb 12.8 oz (62.5 kg)   The 10-year ASCVD risk score Mikey Bussing DC Jr., et al., 2013) is: 32.8%  ----------------------------------------------------------------------------------------- Hypothyroid, follow-up  Lab Results  Component Value Date   TSH 2.360 06/10/2019   TSH 1.860 08/22/2017   TSH 1.77 12/27/2016   Wt Readings from Last 3  Encounters:  06/23/19 145 lb (65.8 kg)  12/24/18 141 lb (64 kg)  03/27/18 137 lb 12.8 oz (62.5 kg)    She was last seen for hypothyroid more than 1 year ago. Management since that visit includes continuing same medication. She reports good compliance with treatment. She is not having side effects.   Symptoms: No change in energy level No constipation No diarrhea No heat / cold intolerance No nervousness No palpitations No weight changes  ----------------------------------------------------------------------------------------- Follow up for Anxiety:  The patient was last seen for this 6 months ago. Changes made at last visit include adding DULoxetine (CYMBALTA) 30 MG capsule.  She reports good compliance with treatment. She feels that condition is Improved. She is not having side effects.   -----------------------------------------------------------------------------------------  Past Medical History:  Diagnosis Date  . Clavicular fracture   . Colon polyps   . History of chicken pox   . History of measles   . History of mumps   . Hyperlipidemia   . Hypoglycemia   . Osteoporosis    Past Surgical History:  Procedure Laterality Date  . ABDOMINAL HYSTERECTOMY  1988   for endometriosis  . BREAST BIOPSY Right 2004  . CLOSED REDUCTION FACIAL FRACTURE     Social History   Socioeconomic History  . Marital status: Married    Spouse name: Not on file  . Number of children: 2  . Years of education: Not on file  . Highest education level: Associate degree: occupational, Hotel manager, or vocational program  Occupational History  . Occupation: Retired  Tobacco Use  . Smoking status: Former Smoker    Types: Cigarettes  . Smokeless tobacco: Never Used  . Tobacco comment: quit in 1969  Substance and Sexual Activity  . Alcohol use: No    Alcohol/week: 0.0 standard drinks  . Drug use: No  . Sexual activity: Not on file  Other Topics Concern  . Not on file  Social  History Narrative   Lives with husband in [place], [place]. Raised horses.   Social Determinants of Health   Financial Resource Strain: Low Risk   . Difficulty of Paying Living Expenses: Not hard at all  Food Insecurity: No Food Insecurity  . Worried About Charity fundraiser in the Last Year: Never true  . Ran Out of Food in the Last Year: Never true  Transportation Needs: No Transportation Needs  . Lack of Transportation (Medical): No  . Lack of Transportation (Non-Medical): No  Physical Activity: Inactive  . Days of Exercise per Week: 0 days  . Minutes of Exercise per Session: 0 min  Stress: Stress Concern Present  . Feeling of Stress : Rather much  Social Connections: Slightly Isolated  . Frequency of Communication with Friends and Family: More than three times a week  . Frequency of Social Gatherings with Friends and Family: Never  . Attends Religious Services: More than 4 times per year  . Active Member of Clubs or Organizations: No  . Attends Archivist Meetings: Never  . Marital Status: Married  Human resources officer Violence: Not At Risk  . Fear of Current or Ex-Partner: No  . Emotionally Abused: No  . Physically Abused: No  . Sexually Abused: No   Family Status  Relation Name Status  . Father  Deceased       Cause of Death: PCB cancer of the blood and heart problems  . Mother  (Not Specified)  . Sister  (Not Specified)  . Brother  (Not Specified)   Family History  Problem Relation Age of Onset  . Heart attack Father   . Leukemia Father   . Hyperlipidemia Mother   . Hypertension Mother   . Breast cancer Mother   . Hyperlipidemia Sister   . Breast cancer Sister   . Hyperlipidemia Brother    Allergies  Allergen Reactions  . Alendronate Nausea Only  . Crestor [Rosuvastatin Calcium]   . Duloxetine Nausea Only    Patient Care Team: Birdie Sons, MD as PCP - General (Family Medicine) Thelma Comp, MD as Referring Physician (Gastroenterology) Gabriel Carina  Betsey Holiday, MD as Physician Assistant (Endocrinology) Lattie Corns, PA-C as Physician Assistant (Physician Assistant) Marlowe Sax, MD as Referring Physician (Internal Medicine)   Medications: Outpatient Medications Prior to Visit  Medication Sig  . albuterol (VENTOLIN HFA) 108 (90 Base) MCG/ACT inhaler Inhale 2 puffs into the lungs every 6 (six) hours as needed for wheezing or shortness of breath.  . Biotin 1000 MCG tablet Take 1,000 mcg by mouth daily.    . budesonide-formoterol (SYMBICORT) 160-4.5 MCG/ACT inhaler 1-2 puffs twice daily (Patient taking differently: 1-2 puffs twice daily as needed)  . buPROPion (WELLBUTRIN SR) 150 MG 12 hr tablet TAKE ONE TABLET TWICE DAILY (Patient taking differently: Only takes 1 a day currently)  . CALCIUM PO Take 600 mg by mouth daily. + D3 800 units  . dicyclomine (BENTYL) 10 MG capsule Take 1 capsule (10 mg total) by mouth 4 (four) times daily -  before meals and  at bedtime. As needed for abdominal pain  . DULoxetine (CYMBALTA) 30 MG capsule Take 1 capsule (30 mg total) by mouth daily.  Marland Kitchen ezetimibe (ZETIA) 10 MG tablet TAKE ONE TABLET EVERY DAY  . ibuprofen (ADVIL,MOTRIN) 200 MG tablet Take 200 mg by mouth every 6 (six) hours as needed. 2 tablets every 3 days   . meclizine (ANTIVERT) 25 MG tablet Take 1 tablet (25 mg total) by mouth 3 (three) times daily as needed for dizziness.  . Melatonin 3 MG TABS Take 1 tablet by mouth at bedtime.  . sennosides-docusate sodium (SENOKOT-S) 8.6-50 MG tablet Take 2-3 tablets by mouth 2 (two) times daily. 3 tablets every morning, then 3-4 tablets in the evenings  . SYNTHROID 100 MCG tablet TAKE 1 TABLET BY MOUTH DAILY (Patient taking differently: Takes 1/2 tablet daily)  . vitamin C (ASCORBIC ACID) 500 MG tablet Take 500 mg by mouth daily.    . Vitamin D, Cholecalciferol, 1000 UNITS TABS Take 2 tablets by mouth daily.  . Zinc 50 MG CAPS Take by mouth.  . zinc gluconate 50 MG tablet Take 50 mg by mouth  daily as needed.    . zoledronic acid (RECLAST) 5 MG/100ML SOLN injection Inject 5 mg into the vein. Once yearly starting June 2019 per Dr. Gabriel Carina   No facility-administered medications prior to visit.    Review of Systems  Constitutional: Positive for activity change, appetite change, fatigue and unexpected weight change. Negative for chills and fever.  HENT: Positive for ear discharge, ear pain and hearing loss. Negative for congestion, rhinorrhea, sneezing and sore throat.   Eyes: Negative.  Negative for pain and redness.  Respiratory: Negative for cough, shortness of breath and wheezing.   Cardiovascular: Negative for chest pain and leg swelling.  Gastrointestinal: Positive for constipation. Negative for abdominal pain, blood in stool, diarrhea and nausea.  Endocrine: Positive for cold intolerance. Negative for polydipsia and polyphagia.  Genitourinary: Negative.  Negative for dysuria, flank pain, hematuria, pelvic pain, vaginal bleeding and vaginal discharge.  Musculoskeletal: Positive for arthralgias, back pain, joint swelling and neck stiffness. Negative for gait problem.  Skin: Positive for color change. Negative for rash.  Neurological: Positive for dizziness and weakness. Negative for tremors, seizures, light-headedness, numbness and headaches.  Hematological: Negative for adenopathy.  Psychiatric/Behavioral: Negative for behavioral problems, confusion and dysphoric mood. The patient is nervous/anxious. The patient is not hyperactive.         Objective:    BP (!) 142/74   Pulse 66   Temp (!) 96.9 F (36.1 C) (Temporal)   Resp 16   Ht 5\' 2"  (1.575 m)   Wt 145 lb (65.8 kg)   SpO2 98% Comment: room air  BMI 26.52 kg/m    Physical Exam   General Appearance:     Well developed, well nourished female. Alert, cooperative, in no acute distress, appears stated age   Head:    Normocephalic, without obvious abnormality, atraumatic  Eyes:    PERRL, conjunctiva/corneas clear,  EOM's intact, fundi    benign, both eyes  Ears:    Normal TM's and external ear canals, both ears  Nose:   Nares normal, septum midline, mucosa normal, no drainage    or sinus tenderness  Throat:   Lips, mucosa, and tongue normal; teeth and gums normal  Neck:   Supple, symmetrical, trachea midline, no adenopathy;    thyroid:  no enlargement/tenderness/nodules; no carotid   bruit or JVD  Back:     Symmetric, no curvature,  ROM normal, no CVA tenderness  Lungs:     Clear to auscultation bilaterally, respirations unlabored  Chest Wall:    No tenderness or deformity   Heart:    Normal heart rate. Normal rhythm. No murmurs, rubs, or gallops.   Breast Exam:    deferred  Abdomen:     Soft, non-tender, bowel sounds active all four quadrants,    no masses, no organomegaly  Pelvic:    deferred and not indicated; post-menopausal, no abnormal Pap smears in past  Extremities:   All extremities are intact. No cyanosis or edema  Pulses:   2+ and symmetric all extremities  Skin:   Skin color, texture, turgor normal, no rashes or lesions  Lymph nodes:   Cervical, supraclavicular, and axillary nodes normal  Neurologic:   CNII-XII intact, normal strength, sensation and reflexes    throughout     Depression Screen  PHQ 2/9 Scores 03/24/2019 03/24/2019 03/24/2019  PHQ - 2 Score 1 1 0  PHQ- 9 Score - - -    No results found for any visits on 06/23/19.    Assessment & Plan:    Routine Health Maintenance and Physical Exam  Exercise Activities and Dietary recommendations Goals    . Prevent falls     Recommend to remove any items from the home that may cause slips or trips.       Immunization History  Administered Date(s) Administered  . Fluad Quad(high Dose 65+) 11/05/2018  . H1N1 12/21/2007  . Influenza Split 12/30/2008, 12/11/2009, 12/22/2010  . Influenza, High Dose Seasonal PF 12/30/2014, 12/02/2015, 10/25/2016, 11/20/2017  . Influenza,inj,quad, With Preservative 11/27/2016  . Pneumococcal  Conjugate-13 12/02/2015  . Pneumococcal Polysaccharide-23 01/03/2008  . Td 05/06/2007  . Tdap 11/17/2011    Health Maintenance  Topic Date Due  . COVID-19 Vaccine (1) Never done  . DEXA SCAN  04/11/2019  . INFLUENZA VACCINE  09/28/2019  . COLONOSCOPY  01/15/2021  . TETANUS/TDAP  11/16/2021  . PNA vac Low Risk Adult  Completed    Discussed health benefits of physical activity, and encouraged her to engage in regular exercise appropriate for her age and condition.  Counseled regarding Shingrix and given prescription to take to pharmacy.   Overall doing fairly well. Rarely requiring symbicort and using albuterol about once or twice a month.   1. Osteoporosis, unspecified osteoporosis type, unspecified pathological fracture presence On Reclast managed by Dr. Jefm Bryant. She report Dr. Jefm Bryant is to arrange BMD over the summer.   2. Hypothyroidism, unspecified type Euthroid, doing well current medications.   3. Hyperlipidemia, unspecified hyperlipidemia type Chol up a little bit. She is taking ezetimibe consistently. Will add - colestipol (COLESTID) 5 g granules; Take 5 g by mouth 2 (two) times daily.  Dispense: 500 g; Refill: 12 Recheck cholesterol in 2-3 months.   4. Encounter for special screening examination for cardiovascular disorder Normal EKG today.      No follow-ups on file.     The entirety of the information documented in the History of Present Illness, Review of Systems and Physical Exam were personally obtained by me. Portions of this information were initially documented by the CMA and reviewed by me for thoroughness and accuracy.      [name], MD  [facility] [phone] (phone) [phone] (fax)  [place]

## 2019-06-23 ENCOUNTER — Encounter: Payer: Self-pay | Admitting: Family Medicine

## 2019-06-23 ENCOUNTER — Telehealth: Payer: Self-pay

## 2019-06-23 ENCOUNTER — Other Ambulatory Visit: Payer: Self-pay

## 2019-06-23 ENCOUNTER — Ambulatory Visit (INDEPENDENT_AMBULATORY_CARE_PROVIDER_SITE_OTHER): Payer: Medicare HMO | Admitting: Family Medicine

## 2019-06-23 VITALS — BP 142/74 | HR 66 | Temp 96.9°F | Resp 16 | Ht 62.0 in | Wt 145.0 lb

## 2019-06-23 DIAGNOSIS — E039 Hypothyroidism, unspecified: Secondary | ICD-10-CM | POA: Diagnosis not present

## 2019-06-23 DIAGNOSIS — Z136 Encounter for screening for cardiovascular disorders: Secondary | ICD-10-CM | POA: Diagnosis not present

## 2019-06-23 DIAGNOSIS — E785 Hyperlipidemia, unspecified: Secondary | ICD-10-CM | POA: Diagnosis not present

## 2019-06-23 DIAGNOSIS — Z0000 Encounter for general adult medical examination without abnormal findings: Secondary | ICD-10-CM

## 2019-06-23 DIAGNOSIS — M810 Age-related osteoporosis without current pathological fracture: Secondary | ICD-10-CM | POA: Diagnosis not present

## 2019-06-23 MED ORDER — COLESTIPOL HCL 5 G PO GRAN: 5.0000 g | GRANULES | Freq: Two times a day (BID) | ORAL | 12 refills | Status: DC

## 2019-06-23 NOTE — Patient Instructions (Signed)
.   Please review the attached list of medications and notify my office if there are any errors.   . Please bring all of your medications to every appointment so we can make sure that our medication list is the same as yours.   

## 2019-06-23 NOTE — Telephone Encounter (Signed)
Patient was seen in the office today and forgot to ask Dr. Caryn Section if he know of any support group resources for caregivers? She says taking care of her husband can be a lot on her at times and she would like to find some type of support group for caregivers. Do either of you know of any resources for this patient?

## 2019-06-24 NOTE — Telephone Encounter (Signed)
No I don't know of any support groups. You could check with the Education officer, museum.

## 2019-06-25 ENCOUNTER — Ambulatory Visit: Payer: Self-pay | Admitting: *Deleted

## 2019-06-25 ENCOUNTER — Encounter: Payer: Self-pay | Admitting: Family Medicine

## 2019-06-25 NOTE — Chronic Care Management (AMB) (Signed)
   Chronic Care Management   Unsuccessful Call Note 06/25/2019 Name: Laura Love MRN: [id number] DOB: 02/08/1941  Patient is a 79 year old female who sees [name], MD for primary care. Dr. Caryn Section asked the CCM team to consult the patient for community resources.    This social worker was unable to reach patient via telephone today to provide resources for area support groups for caregivers. I have left HIPAA compliant voicemail asking patient to return my call. (unsuccessful outreach #1).   Plan: Will follow-up within 7 business days via telephone.      Elliot Gurney, [place] Worker  [facility] [phone]

## 2019-06-27 ENCOUNTER — Ambulatory Visit: Payer: Self-pay | Admitting: *Deleted

## 2019-06-27 ENCOUNTER — Telehealth: Payer: Self-pay | Admitting: *Deleted

## 2019-06-30 NOTE — Chronic Care Management (AMB) (Signed)
  Chronic Care Management   Social Work Note  06/30/2019 Name: Laura Love MRN: [id number] DOB: Apr 24, 1940  Laura Love is a 79 y.o. year old female who sees Fisher, Kirstie Peri, MD for primary care. The CCM team was consulted for assistance with [employer]  for area support groups.  Phone call to patient to discuss support group resources. This Education officer, museum discussed support group resources through the [facility]. Patient thanked this Education officer, museum for the call, however declined need at this time due to time constraints.    SDOH (Social Determinants of Health) assessments performed: No     Outpatient Encounter Medications as of 06/27/2019  Medication Sig  . albuterol (VENTOLIN HFA) 108 (90 Base) MCG/ACT inhaler Inhale 2 puffs into the lungs every 6 (six) hours as needed for wheezing or shortness of breath.  . Biotin 1000 MCG tablet Take 1,000 mcg by mouth daily.    . budesonide-formoterol (SYMBICORT) 160-4.5 MCG/ACT inhaler 1-2 puffs twice daily (Patient taking differently: 1-2 puffs twice daily as needed)  . buPROPion (WELLBUTRIN SR) 150 MG 12 hr tablet TAKE ONE TABLET TWICE DAILY (Patient taking differently: Only takes 1 a day currently)  . CALCIUM PO Take 600 mg by mouth daily. + D3 800 units  . colestipol (COLESTID) 5 g granules Take 5 g by mouth 2 (two) times daily.  Marland Kitchen dicyclomine (BENTYL) 10 MG capsule Take 1 capsule (10 mg total) by mouth 4 (four) times daily -  before meals and at bedtime. As needed for abdominal pain  . DULoxetine (CYMBALTA) 30 MG capsule Take 1 capsule (30 mg total) by mouth daily.  Marland Kitchen ezetimibe (ZETIA) 10 MG tablet TAKE ONE TABLET EVERY DAY  . ibuprofen (ADVIL,MOTRIN) 200 MG tablet Take 200 mg by mouth every 6 (six) hours as needed. 2 tablets every 3 days   . meclizine (ANTIVERT) 25 MG tablet Take 1 tablet (25 mg total) by mouth 3 (three) times daily as needed for dizziness.  . Melatonin 3 MG TABS Take 1 tablet by mouth at bedtime.  .  sennosides-docusate sodium (SENOKOT-S) 8.6-50 MG tablet Take 2-3 tablets by mouth 2 (two) times daily. 3 tablets every morning, then 3-4 tablets in the evenings  . SYNTHROID 100 MCG tablet TAKE 1 TABLET BY MOUTH DAILY (Patient taking differently: Takes 1/2 tablet daily)  . vitamin C (ASCORBIC ACID) 500 MG tablet Take 500 mg by mouth daily.    . Vitamin D, Cholecalciferol, 1000 UNITS TABS Take 2 tablets by mouth daily.  . Zinc 50 MG CAPS Take by mouth.  . zinc gluconate 50 MG tablet Take 50 mg by mouth daily as needed.    . zoledronic acid (RECLAST) 5 MG/100ML SOLN injection Inject 5 mg into the vein. Once yearly starting June 2019 per Dr. Gabriel Carina   No facility-administered encounter medications on file as of 06/27/2019.    Goals Addressed   None     Follow Up Plan: Client will reach out to this social worker in the future if she decides to follow up with one of the support groups discussed previously   Elliot Gurney, [place] Worker  [facility] [phone]

## 2019-08-04 ENCOUNTER — Telehealth: Payer: Self-pay

## 2019-08-04 NOTE — Telephone Encounter (Signed)
Copied from [place] [id number]. Topic: General - Other >> Aug 04, 2019 11:56 AM Leward Quan A wrote: Reason for CRM: Patient called to inquire of Dr Caryn Section about the new medication for cholesterol colestipol (COLESTID) 5 g granules, say that the pills are too big but if she can get that and crush them she is ok with that. Per patient the granules are too expensive and she can not afford it. Please call with questions at Ph#  [phone]

## 2019-08-04 NOTE — Telephone Encounter (Signed)
Yes, its ok to cut or crush them.

## 2019-08-04 NOTE — Telephone Encounter (Signed)
Patient advised and verbalized understanding 

## 2019-08-07 ENCOUNTER — Telehealth: Payer: Self-pay | Admitting: Family Medicine

## 2019-08-07 MED ORDER — SYNTHROID 100 MCG PO TABS: 100.0000 ug | ORAL_TABLET | Freq: Every day | ORAL | 3 refills | Status: DC

## 2019-08-07 NOTE — Telephone Encounter (Signed)
Pt found out from her insurance that she can get her SYNTHROID 100 MCG tablet  At a way lower cost than her other pharmacy / Pt would like a 90 day supply sent to  [place], [place] [facility] [place] [facility] Phone:  [phone]  Fax:  [phone]     Please advise

## 2019-08-14 ENCOUNTER — Encounter: Payer: Self-pay | Admitting: Adult Health

## 2019-08-14 ENCOUNTER — Ambulatory Visit (INDEPENDENT_AMBULATORY_CARE_PROVIDER_SITE_OTHER): Payer: Medicare HMO | Admitting: Adult Health

## 2019-08-14 ENCOUNTER — Other Ambulatory Visit: Payer: Self-pay

## 2019-08-14 VITALS — BP 122/70 | HR 72 | Temp 97.7°F | Resp 16 | Ht 62.0 in | Wt 144.0 lb

## 2019-08-14 DIAGNOSIS — W57XXXA Bitten or stung by nonvenomous insect and other nonvenomous arthropods, initial encounter: Secondary | ICD-10-CM

## 2019-08-14 DIAGNOSIS — L03116 Cellulitis of left lower limb: Secondary | ICD-10-CM

## 2019-08-14 DIAGNOSIS — M2550 Pain in unspecified joint: Secondary | ICD-10-CM | POA: Diagnosis not present

## 2019-08-14 MED ORDER — DOXYCYCLINE HYCLATE 100 MG PO TABS: 100.0000 mg | ORAL_TABLET | Freq: Two times a day (BID) | ORAL | 0 refills | Status: AC

## 2019-08-14 NOTE — Patient Instructions (Signed)
Tick Bite Information, Adult  Ticks are insects that can bite. Most ticks live in shrubs and grassy areas. They climb onto people and animals that go by. Then they bite. Some ticks carry germs that can make you sick. How can I prevent tick bites?  Use an insect repellent that has 20% or higher of the ingredients DEET, picaridin, or IR3535. Put this insect repellent on: ? Bare skin. ? The tops of your boots. ? Your pant legs. ? The ends of your sleeves.  If you use an insect repellent that has the ingredient permethrin, make sure to follow the instructions on the bottle. Treat the following: ? Clothing. ? Supplies. ? Boots. ? Tents.  Wear long sleeves, long pants, and light colors.  Tuck your pant legs into your socks.  Stay in the middle of the trail.  Try not to walk through long grass.  Before going inside your house, check your clothes, hair, and skin for ticks. Make sure to check your head, neck, armpits, waist, groin, and joint areas.  Check for ticks every day.  When you come indoors: ? Wash your clothes right away. ? Shower right away. ? Dry your clothes in a dryer on high heat for 60 minutes or more. What is the right way to remove a tick? Remove a tick from your skin as soon as possible.  To remove a tick that is crawling on your skin: ? Go outdoors and brush the tick off. ? Use tape or a lint roller.  To remove a tick that is biting: ? Wash your hands. ? If you have latex gloves, put them on. ? Use tweezers, curved forceps, or a tick-removal tool to grasp the tick. Grasp the tick as close to your skin and as close to the tick's head as possible. ? Gently pull up until the tick lets go.  Try to keep the tick's head attached to its body.  Do not twist or jerk the tick.  Do not squeeze or crush the tick. Do not try to remove a tick with heat, alcohol, petroleum jelly, or fingernail polish. How should I get rid of a tick? Here are some ways to get rid of a  tick that is alive:  Place the tick in rubbing alcohol.  Place the tick in a bag or container you can close tightly.  Wrap the tick tightly in tape.  Flush the tick down the toilet. Contact a doctor if:  You have symptoms of a disease, such as: ? Pain in a muscle, joint, or bone. ? Trouble walking or moving your legs. ? Numbness in your legs. ? Inability to move (paralysis). ? A red rash that makes a circle (bull's-eye rash). ? Redness and swelling where the tick bit you. ? A fever. ? Throwing up (vomiting) over and over. ? Diarrhea. ? Weight loss. ? Tender and swollen lymph glands. ? Shortness of breath. ? Cough. ? Belly pain (abdominal pain). ? Headache. ? Being more tired than normal. ? A change in how alert (conscious) you are. ? Confusion. Get help right away if:  You cannot remove a tick.  A part of a tick breaks off and gets stuck in your skin.  You are feeling worse. Summary  Ticks may carry germs that can make you sick.  To prevent tick bites, wear long sleeves, long pants, and light colors. Use insect repellent. Follow the instructions on the bottle.  If the tick is biting, do not try to remove   it with heat, alcohol, petroleum jelly, or fingernail polish.  Use tweezers, curved forceps, or a tick-removal tool to grasp the tick. Gently pull up until the tick lets go. Do not twist or jerk the tick. Do not squeeze or crush the tick.  If you have symptoms, contact a doctor. This information is not intended to replace advice given to you by your health care provider. Make sure you discuss any questions you have with your health care provider. Document Revised: 01/28/2018 Document Reviewed: 05/26/2016 Elsevier Patient Education  [place]. Lyme Disease Lyme disease is an infection that can affect many parts of the body, including the skin, joints, and nervous system. It is a bacterial infection that starts from the bite of an infected tick. Over time,  the infection can worsen, and some of the symptoms are similar to the flu. If Lyme disease is not treated, it may cause joint pain, swelling, numbness, problems thinking, fatigue, muscle weakness, and other problems. What are the causes? This condition is caused by bacteria called Borrelia burgdorferi.  You can get Lyme disease by being bitten by an infected tick.  Only black-legged, or Ixodes, ticks that are infected with the bacteria can cause Lyme disease.  The tick must be attached to your skin for a certain period of time to pass along the infection. This is usually 36-48 hours.  Deer often carry infected ticks. What increases the risk? The following factors may make you more likely to develop this condition:  Living in or visiting these areas in the [place] ? [place]. ? [place]. ? [place].  Spending time in wooded or grassy areas.  Being outdoors with exposed skin.  Camping, gardening, hiking, fishing, hunting, or working outdoors.  Failing to remove a tick from your skin. What are the signs or symptoms? Symptoms of this condition may include:  Chills and fever.  Headache.  Fatigue.  General achiness.  Muscle pain.  Joint pain, often in the knees.  A round, red rash that surrounds the center of the tick bite. The center of the rash may be blood colored or have tiny blisters.  Swollen lymph glands.  Stiff neck. How is this diagnosed? This condition is diagnosed based on:  Your symptoms and medical history.  A physical exam.  A blood test. How is this treated? The main treatment for this condition is antibiotic medicine, which is usually taken by mouth (orally).  The length of treatment depends on how soon after a tick bite you begin taking the medicine. In some cases, treatment is necessary for several weeks.  If the infection is severe, antibiotics may need to be given through an IV that is inserted into one of your  veins. Follow these instructions at home:  Take over-the-counter and prescription medicines only as told by your health care provider. Finish all antibiotic medicine, even when you start to feel better.  Ask your health care provider about taking a probiotic in between doses of your antibiotic to help avoid an upset stomach or diarrhea.  Check with your health care provider before supplementing your treatment. Many alternative therapies have not been proven and may be harmful to you.  Keep all follow-up visits as told by your health care provider. This is important. How is this prevented? You can become reinfected if you get another tick bite from an infected tick. Take these steps to help prevent an infection:  Cover your skin with light-colored clothing when you are  outdoors in the spring and summer months.  Spray clothing and skin with bug spray. The spray should be 20-30% DEET. You can also treat clothing with permethrin, and let it dry before you wear it. Do not apply permethrin directly to your skin. Permethrin can also be used to treat camping gear and boots. Always read and follow the instructions that come with a bug spray or insecticide.  Avoid wooded, grassy, and shaded areas.  Remove yard litter, brush, trash, and plants that attract deer and rodents.  Check yourself for ticks when you come indoors.  Wash clothing worn each day.  Shower after spending time outdoors.  Check your pets for ticks before they come inside.  If you find a tick attached to your skin: ? Remove it with tweezers. ? Clean your hands and the bite area with rubbing alcohol or soap and water. ? Dispose of the tick by putting it in rubbing alcohol, putting it in a sealed bag or container, or flushing it down the toilet. ? You may choose to save the tick in a sealed container if you wish for it to be tested at a later time. Pregnant women should take special care to avoid tick bites because it is  possible that the infection may be passed along to the fetus. Contact a health care provider if:  You have symptoms after treatment.  You have removed a tick and want to bring it to your health care provider for testing. Get help right away if:  You have an irregular heartbeat.  You have chest pain.  You have nerve pain.  Your face feels numb.  You develop the following: ? A stiff neck. ? A severe headache. ? Severe nausea and vomiting. ? Sensitivity to light. Summary  Lyme disease is an infection that can affect many parts of the body, including the skin, joints, and nervous system.  This condition is caused by bacteria called Borrelia burgdorferi.  You can get Lyme disease by being bitten by an infected tick.  The main treatment for this condition is antibiotic medicine. This information is not intended to replace advice given to you by your health care provider. Make sure you discuss any questions you have with your health care provider. Document Revised: 06/07/2018 Document Reviewed: 05/02/2018 Elsevier Patient Education  2020 Elsevier Inc. Doxycycline tablets or capsules What is this medicine? DOXYCYCLINE (dox i SYE kleen) is a tetracycline antibiotic. It kills certain bacteria or stops their growth. It is used to treat many kinds of infections, like dental, skin, respiratory, and urinary tract infections. It also treats acne, Lyme disease, malaria, and certain sexually transmitted infections. This medicine may be used for other purposes; ask your health care provider or pharmacist if you have questions. COMMON BRAND NAME(S): Acticlate, Adoxa, Adoxa CK, Adoxa Pak, Adoxa TT, Alodox, Avidoxy, Doxal, LYMEPAK, Mondoxyne NL, Monodox, Morgidox 1x, Morgidox 1x Kit, Morgidox 2x, Morgidox 2x Kit, NutriDox, Ocudox, [place], [place], Vibra-Tabs, Vibramycin What should I tell my health care provider before I take this medicine? They need to know if you have any of these  conditions:  liver disease  long exposure to sunlight like working outdoors  stomach problems like colitis  an unusual or allergic reaction to doxycycline, tetracycline antibiotics, other medicines, foods, dyes, or preservatives  pregnant or trying to get pregnant  breast-feeding How should I use this medicine? Take this medicine by mouth with a full glass of water. Follow the directions on the prescription label. It is best to  take this medicine without food, but if it upsets your stomach take it with food. Take your medicine at regular intervals. Do not take your medicine more often than directed. Take all of your medicine as directed even if you think you are better. Do not skip doses or stop your medicine early. Talk to your pediatrician regarding the use of this medicine in children. While this drug may be prescribed for selected conditions, precautions do apply. Overdosage: If you think you have taken too much of this medicine contact a poison control center or emergency room at once. NOTE: This medicine is only for you. Do not share this medicine with others. What if I miss a dose? If you miss a dose, take it as soon as you can. If it is almost time for your next dose, take only that dose. Do not take double or extra doses. What may interact with this medicine?  antacids  barbiturates  birth control pills  bismuth subsalicylate  carbamazepine  methoxyflurane  other antibiotics  phenytoin  vitamins that contain iron  warfarin This list may not describe all possible interactions. Give your health care provider a list of all the medicines, herbs, non-prescription drugs, or dietary supplements you use. Also tell them if you smoke, drink alcohol, or use illegal drugs. Some items may interact with your medicine. What should I watch for while using this medicine? Tell your doctor or health care professional if your symptoms do not improve. Do not treat diarrhea with over  the counter products. Contact your doctor if you have diarrhea that lasts more than 2 days or if it is severe and watery. Do not take this medicine just before going to bed. It may not dissolve properly when you lay down and can cause pain in your throat. Drink plenty of fluids while taking this medicine to also help reduce irritation in your throat. This medicine can make you more sensitive to the sun. Keep out of the sun. If you cannot avoid being in the sun, wear protective clothing and use sunscreen. Do not use sun lamps or tanning beds/booths. Birth control pills may not work properly while you are taking this medicine. Talk to your doctor about using an extra method of birth control. If you are being treated for a sexually transmitted infection, avoid sexual contact until you have finished your treatment. Your sexual partner may also need treatment. Avoid antacids, aluminum, calcium, magnesium, and iron products for 4 hours before and 2 hours after taking a dose of this medicine. If you are using this medicine to prevent malaria, you should still protect yourself from contact with mosquitos. Stay in screened-in areas, use mosquito nets, keep your body covered, and use an insect repellent. What side effects may I notice from receiving this medicine? Side effects that you should report to your doctor or health care professional as soon as possible:  allergic reactions like skin rash, itching or hives, swelling of the face, lips, or tongue  difficulty breathing  fever  itching in the rectal or genital area  pain on swallowing  rash, fever, and swollen lymph nodes  redness, blistering, peeling or loosening of the skin, including inside the mouth  severe stomach pain or cramps  unusual bleeding or bruising  unusually weak or tired  yellowing of the eyes or skin Side effects that usually do not require medical attention (report to your doctor or health care professional if they  continue or are bothersome):  diarrhea  loss of  appetite  nausea, vomiting This list may not describe all possible side effects. Call your doctor for medical advice about side effects. You may report side effects to FDA at 1-800-FDA-1088. Where should I keep my medicine? Keep out of the reach of children. Store at room temperature, below 30 degrees C (86 degrees F). Protect from light. Keep container tightly closed. Throw away any unused medicine after the expiration date. Taking this medicine after the expiration date can make you seriously ill. NOTE: This sheet is a summary. It may not cover all possible information. If you have questions about this medicine, talk to your doctor, pharmacist, or health care provider.  2020 Elsevier/Gold Standard (2018-05-16 13:44:53)  Cellulitis, Adult  Cellulitis is a skin infection. The infected area is often warm, red, swollen, and sore. It occurs most often in the arms and lower legs. It is very important to get treated for this condition. What are the causes? This condition is caused by bacteria. The bacteria enter through a break in the skin, such as a cut, burn, insect bite, open sore, or crack. What increases the risk? This condition is more likely to occur in people who:  Have a weak body defense system (immune system).  Have open cuts, burns, bites, or scrapes on the skin.  Are older than 79 years of age.  Have a blood sugar problem (diabetes).  Have a long-lasting (chronic) liver disease (cirrhosis) or kidney disease.  Are very overweight (obese).  Have a skin problem, such as: ? Itchy rash (eczema). ? Slow movement of blood in the veins (venous stasis). ? Fluid buildup below the skin (edema).  Have been treated with high-energy rays (radiation).  Use IV drugs. What are the signs or symptoms? Symptoms of this condition include:  Skin that is: ? Red. ? Streaking. ? Spotting. ? Swollen. ? Sore or painful when you touch  it. ? Warm.  A fever.  Chills.  Blisters. How is this diagnosed? This condition is diagnosed based on:  Medical history.  Physical exam.  Blood tests.  Imaging tests. How is this treated? Treatment for this condition may include:  Medicines to treat infections or allergies.  Home care, such as: ? Rest. ? Placing cold or warm cloths (compresses) on the skin.  Hospital care, if the condition is very bad. Follow these instructions at home: Medicines  Take over-the-counter and prescription medicines only as told by your doctor.  If you were prescribed an antibiotic medicine, take it as told by your doctor. Do not stop taking it even if you start to feel better. General instructions   Drink enough fluid to keep your pee (urine) pale yellow.  Do not touch or rub the infected area.  Raise (elevate) the infected area above the level of your heart while you are sitting or lying down.  Place cold or warm cloths on the area as told by your doctor.  Keep all follow-up visits as told by your doctor. This is important. Contact a doctor if:  You have a fever.  You do not start to get better after 1-2 days of treatment.  Your bone or joint under the infected area starts to hurt after the skin has healed.  Your infection comes back. This can happen in the same area or another area.  You have a swollen bump in the area.  You have new symptoms.  You feel ill and have muscle aches and pains. Get help right away if:  Your symptoms get worse.  You feel very sleepy.  You throw up (vomit) or have watery poop (diarrhea) for a long time.  You see red streaks coming from the area.  Your red area gets larger.  Your red area turns dark in color. These symptoms may represent a serious problem that is an emergency. Do not wait to see if the symptoms will go away. Get medical help right away. Call your local emergency services (911 in the [place] Do not drive yourself to the  hospital. Summary  Cellulitis is a skin infection. The area is often warm, red, swollen, and sore.  This condition is treated with medicines, rest, and cold and warm cloths.  Take all medicines only as told by your doctor.  Tell your doctor if symptoms do not start to get better after 1-2 days of treatment. This information is not intended to replace advice given to you by your health care provider. Make sure you discuss any questions you have with your health care provider. Document Revised: 07/05/2017 Document Reviewed: 07/05/2017 Elsevier Patient Education  [place].

## 2019-08-14 NOTE — Progress Notes (Addendum)
Established patient visit   Patient: Laura Love   DOB: 1940/06/15   79 y.o. Female  MRN: [id number] Visit Date: 08/14/2019  Today's healthcare provider: Marcille Buffy, FNP   Chief Complaint  Patient presents with  . Insect Bite   Subjective    HPI Insect bite: Patient reports that she was bitten by a tick on her lower left leg. Tick was pulled off 1 week ago. Patient complains of pain redness and swelling around the bite.    She has mild warmth to area. Denies any pain. Denies any calf pain. She denies any injury, pain or trauma. She denies any discoloration of her left lower leg prior to the tick bite. She reports it was a small deer tick and it was stuck on good.   Patient  denies any fever, body aches,chills, rash, chest pain, shortness of breath, nausea, vomiting, or diarrhea.      Patient Active Problem List   Diagnosis Date Noted  . Tick bite 08/14/2019  . Cellulitis of left lower extremity 08/14/2019  . History of measles, mumps, or rubella 04/10/2017  . Allergic rhinitis 04/10/2017  . Osteoporosis 04/10/2017  . Shortness of breath 11/17/2016  . History of adenomatous polyp of colon 12/21/2014  . Hypothyroidism 11/08/2010  . Hyperlipemia 10/19/2010  . Bladder incontinence 10/19/2010  . Vaginal prolapse 10/19/2010  . Cervical pain 09/22/2009  . 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 CN (constipation) 07/02/2008  . 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 Cannot sleep 03/10/2007  . Dysthymia 03/10/2007  . Family history of breast cancer 03/10/2007   Past Medical History:  Diagnosis Date  . Clavicular fracture   . Colon polyps   . History of chicken pox   . History of measles   . History of mumps   . Hyperlipidemia   . Hypoglycemia   . Osteoporosis        Medications: Outpatient Medications Prior to Visit  Medication Sig  . albuterol (VENTOLIN HFA) 108 (90 Base) MCG/ACT inhaler  Inhale 2 puffs into the lungs every 6 (six) hours as needed for wheezing or shortness of breath.  . Biotin 1000 MCG tablet Take 1,000 mcg by mouth daily.    . budesonide-formoterol (SYMBICORT) 160-4.5 MCG/ACT inhaler 1-2 puffs twice daily (Patient taking differently: 1-2 puffs twice daily as needed)  . buPROPion (WELLBUTRIN SR) 150 MG 12 hr tablet TAKE ONE TABLET TWICE DAILY (Patient taking differently: Only takes 1 a day currently)  . CALCIUM PO Take 600 mg by mouth daily. + D3 800 units  . colestipol (COLESTID) 5 g granules Take 5 g by mouth 2 (two) times daily.  Marland Kitchen dicyclomine (BENTYL) 10 MG capsule Take 1 capsule (10 mg total) by mouth 4 (four) times daily -  before meals and at bedtime. As needed for abdominal pain  . DULoxetine (CYMBALTA) 30 MG capsule Take 1 capsule (30 mg total) by mouth daily.  Marland Kitchen ezetimibe (ZETIA) 10 MG tablet TAKE ONE TABLET EVERY DAY  . ibuprofen (ADVIL,MOTRIN) 200 MG tablet Take 200 mg by mouth every 6 (six) hours as needed. 2 tablets every 3 days   . meclizine (ANTIVERT) 25 MG tablet Take 1 tablet (25 mg total) by mouth 3 (three) times daily as needed for dizziness.  . Melatonin 3 MG TABS Take 1 tablet by mouth at bedtime.  . sennosides-docusate sodium (SENOKOT-S) 8.6-50 MG tablet Take 2-3 tablets by mouth 2 (two) times daily. 3  tablets every morning, then 3-4 tablets in the evenings  . SYNTHROID 100 MCG tablet Take 1 tablet (100 mcg total) by mouth daily.  . vitamin C (ASCORBIC ACID) 500 MG tablet Take 500 mg by mouth daily.    . Vitamin D, Cholecalciferol, 1000 UNITS TABS Take 2 tablets by mouth daily.  . Zinc 50 MG CAPS Take by mouth.  . zinc gluconate 50 MG tablet Take 50 mg by mouth daily as needed.    . zoledronic acid (RECLAST) 5 MG/100ML SOLN injection Inject 5 mg into the vein. Once yearly starting June 2019 per Dr. Gabriel Carina   No facility-administered medications prior to visit.    Review of Systems  Constitutional: Negative for appetite change, chills,  fatigue and fever.  HENT: Negative.   Respiratory: Negative for chest tightness and shortness of breath.   Cardiovascular: Negative for chest pain and palpitations.  Gastrointestinal: Negative for abdominal pain, nausea and vomiting.  Musculoskeletal: Negative for arthralgias, back pain and myalgias.  Skin: Positive for color change, rash and wound (tick bite ). Negative for pallor.  Neurological: Negative for dizziness and weakness.    Last CBC Lab Results  Component Value Date   WBC 5.0 08/22/2017   HGB 14.1 08/22/2017   HCT 42.5 08/22/2017   MCV 97 08/22/2017   MCH 32.3 08/22/2017   RDW 12.3 08/22/2017   PLT 243 54/65/0354   Last metabolic panel Lab Results  Component Value Date   GLUCOSE 87 06/10/2019   NA 137 06/10/2019   K 4.1 06/10/2019   CL 100 06/10/2019   CO2 22 06/10/2019   BUN 10 06/10/2019   CREATININE 0.79 06/10/2019   GFRNONAA 72 06/10/2019   GFRAA 83 06/10/2019   CALCIUM 9.0 06/10/2019   PROT 6.4 06/10/2019   ALBUMIN 4.4 06/10/2019   LABGLOB 2.0 06/10/2019   AGRATIO 2.2 06/10/2019   BILITOT 0.4 06/10/2019   ALKPHOS 78 06/10/2019   AST 16 06/10/2019   ALT 14 06/10/2019      Objective    BP 122/70 (BP Location: Left Arm, Patient Position: Sitting)   Pulse 72   Temp 97.7 F (36.5 C) (Temporal)   Resp 16   Ht 5\' 2"  (1.575 m)   Wt 144 lb (65.3 kg)   SpO2 97% Comment: room air  BMI 26.34 kg/m  BP Readings from Last 3 Encounters:  08/14/19 122/70  06/23/19 (!) 142/74  12/24/18 122/60      Physical Exam Constitutional:      General: She is not in acute distress.    Appearance: Normal appearance. She is not ill-appearing, toxic-appearing or diaphoretic.  HENT:     Head: Normocephalic and atraumatic.  Eyes:     Conjunctiva/sclera: Conjunctivae normal.  Cardiovascular:     Pulses: Normal pulses.     Heart sounds: Normal heart sounds.  Pulmonary:     Effort: Pulmonary effort is normal.     Breath sounds: Normal breath sounds.    Abdominal:     Palpations: Abdomen is soft.  Musculoskeletal:        General: Normal range of motion.     Cervical back: Normal range of motion and neck supple. No tenderness.  Skin:    General: Skin is warm.     Capillary Refill: Capillary refill takes less than 2 seconds.     Findings: Bruising, ecchymosis, erythema and wound (area tick bite was removed from. ) present.       Neurological:     General: No focal  deficit present.     Mental Status: She is alert and oriented to person, place, and time.  Psychiatric:        Mood and Affect: Mood normal.        Behavior: Behavior normal.        Thought Content: Thought content normal.        Judgment: Judgment normal.     Media Information   Document Information  Photos  Left anterior lower leg where feet tick was removed 1 week ago  08/14/2019 09:59  Attached To:  Office Visit on 08/14/19 with Abbigal Radich, Kelby Aline, FNP  Source Information  Deadra Diggins, Kelby Aline, FNP  [facility]     No results found for any visits on 08/14/19.  Assessment & Plan     Tick bite, initial encounter - Plan: CBC with Differential/Platelet, Lyme Ab/Western Blot Reflex, Rocky mtn spotted fvr abs pnl(IgG+IgM), Comprehensive Metabolic Panel (CMET)  Cellulitis of left lower extremity  Will cover tick bite and cellulitis with Doxycycline, considered Augmentin but she gets nauseated easily as well. Advised to return to the office if any symptoms worsen or change at anytime or not improving within 3 to 5 days. Advised not to lay down within one hour of taking medication and increased sun sensitivity with Doxycycline.   Orders Placed This Encounter  Procedures  . CBC with Differential/Platelet  . Lyme Ab/Western Blot Reflex  . Rocky mtn spotted fvr abs pnl(IgG+IgM)  . Comprehensive Metabolic Panel (CMET)   An After Visit Summary was printed and given to the patient. Return in about 1 week (around 08/21/2019), or if symptoms worsen or  fail to improve, for at any time for any worsening symptoms, Go to Emergency room/ urgent care if worse.      Advised patient call the office or your primary care doctor for an appointment if no improvement within 72 hours or if any symptoms change or worsen at any time  Advised ER or urgent Care if after hours or on weekend. Call 911 for emergency symptoms at any time.Patinet verbalized understanding of all instructions given/reviewed and treatment plan and has no further questions or concerns at this time.     [place] Josemanuel Eakins, FNP, have reviewed all documentation for this visit. The documentation on 08/14/19 for the exam, diagnosis, procedures, and orders are all accurate and complete.   Marcille Buffy, [place] [phone] (phone) [phone] (fax)  [place]

## 2019-08-15 NOTE — Progress Notes (Signed)
CBC and CMP within normal- ok.  Lyme negative  RMSF pending. Will call if positive by chance. Otherwise follow up as directed at office visit if needed.

## 2019-08-16 LAB — CBC WITH DIFFERENTIAL/PLATELET
Basophils Absolute: 0.1 10*3/uL (ref 0.0–0.2)
Basos: 2 %
EOS (ABSOLUTE): 0.1 10*3/uL (ref 0.0–0.4)
Eos: 2 %
Hematocrit: 41.7 % (ref 34.0–46.6)
Hemoglobin: 13.8 g/dL (ref 11.1–15.9)
Immature Grans (Abs): 0 10*3/uL (ref 0.0–0.1)
Immature Granulocytes: 0 %
Lymphocytes Absolute: 1.1 10*3/uL (ref 0.7–3.1)
Lymphs: 26 %
MCH: 32.5 pg (ref 26.6–33.0)
MCHC: 33.1 g/dL (ref 31.5–35.7)
MCV: 98 fL — ABNORMAL HIGH (ref 79–97)
Monocytes Absolute: 0.5 10*3/uL (ref 0.1–0.9)
Monocytes: 11 %
Neutrophils Absolute: 2.6 10*3/uL (ref 1.4–7.0)
Neutrophils: 59 %
Platelets: 206 10*3/uL (ref 150–450)
RBC: 4.25 x10E6/uL (ref 3.77–5.28)
RDW: 11.9 % (ref 11.7–15.4)
WBC: 4.4 10*3/uL (ref 3.4–10.8)

## 2019-08-16 LAB — COMPREHENSIVE METABOLIC PANEL
ALT: 21 IU/L (ref 0–32)
AST: 17 IU/L (ref 0–40)
Albumin/Globulin Ratio: 1.9 (ref 1.2–2.2)
Albumin: 4.2 g/dL (ref 3.7–4.7)
Alkaline Phosphatase: 96 IU/L (ref 48–121)
BUN/Creatinine Ratio: 17 (ref 12–28)
BUN: 14 mg/dL (ref 8–27)
Bilirubin Total: 0.4 mg/dL (ref 0.0–1.2)
CO2: 23 mmol/L (ref 20–29)
Calcium: 9.2 mg/dL (ref 8.7–10.3)
Chloride: 100 mmol/L (ref 96–106)
Creatinine, Ser: 0.84 mg/dL (ref 0.57–1.00)
GFR calc Af Amer: 77 mL/min/{1.73_m2} (ref >59)
GFR calc non Af Amer: 67 mL/min/{1.73_m2} (ref >59)
Globulin, Total: 2.2 g/dL (ref 1.5–4.5)
Glucose: 77 mg/dL (ref 65–99)
Potassium: 4 mmol/L (ref 3.5–5.2)
Sodium: 138 mmol/L (ref 134–144)
Total Protein: 6.4 g/dL (ref 6.0–8.5)

## 2019-08-16 LAB — LYME AB/WESTERN BLOT REFLEX
LYME DISEASE AB, QUANT, IGM: <0.8 index (ref 0.00–0.79)
Lyme IgG/IgM Ab: <0.91 {ISR} (ref 0.00–0.90)

## 2019-08-16 LAB — ROCKY MTN SPOTTED FVR ABS PNL(IGG+IGM)
RMSF IgG: NEGATIVE
RMSF IgM: 0.2 index (ref 0.00–0.89)

## 2019-08-25 ENCOUNTER — Telehealth: Payer: Self-pay

## 2019-08-25 NOTE — Telephone Encounter (Signed)
Copied from [place] [phone]. Topic: General - Other >> Aug 25, 2019  1:55 PM Laura Love wrote: Patient stated that there was Love paper left up front for her in regards to her handicap sticker and she would like to know if  she can have the same handicap sticker form for her husband Laura Love mailed to their home instead of her coming into office. Verified mailing address with patient. Please advise

## 2019-09-02 DIAGNOSIS — E559 Vitamin D deficiency, unspecified: Secondary | ICD-10-CM | POA: Diagnosis not present

## 2019-09-02 DIAGNOSIS — M8588 Other specified disorders of bone density and structure, other site: Secondary | ICD-10-CM | POA: Diagnosis not present

## 2019-09-02 DIAGNOSIS — M810 Age-related osteoporosis without current pathological fracture: Secondary | ICD-10-CM | POA: Diagnosis not present

## 2019-09-06 ENCOUNTER — Other Ambulatory Visit: Payer: Self-pay | Admitting: Family Medicine

## 2019-09-06 NOTE — Telephone Encounter (Signed)
Change in pharmacy Requested Prescriptions  Pending Prescriptions Disp Refills   SYNTHROID 100 MCG tablet [Pharmacy Med Name: SYNTHROID 100 MCG TAB] 90 tablet 3    Sig: TAKE 1 TABLET BY MOUTH DAILY     Endocrinology:  Hypothyroid Agents Failed - 09/06/2019  8:36 AM      Failed - TSH needs to be rechecked within 3 months after an abnormal result. Refill until TSH is due.      Passed - TSH in normal range and within 360 days    TSH  Date Value Ref Range Status  06/10/2019 2.360 0.450 - 4.500 uIU/mL Final         Passed - Valid encounter within last 12 months    Recent Outpatient Visits          3 weeks ago Tick bite, initial encounter   [facility] Flinchum, Kelby Aline, FNP   2 months ago Osteoporosis, unspecified osteoporosis type, unspecified pathological fracture presence   [facility] Birdie Sons, MD   8 months ago Arthralgia, unspecified joint   [facility] Birdie Sons, MD   1 year ago Sensation of foreign body in eye   [facility] [place], Dionne Bucy, MD   1 year ago [place], Donald E, MD

## 2019-09-08 ENCOUNTER — Telehealth: Payer: Self-pay | Admitting: Family Medicine

## 2019-09-08 DIAGNOSIS — E785 Hyperlipidemia, unspecified: Secondary | ICD-10-CM

## 2019-09-08 NOTE — Telephone Encounter (Signed)
Patient has been advised. KW 

## 2019-09-08 NOTE — Telephone Encounter (Signed)
I called patient back to ask what she meant by "her cholesterol is improving". Patient says that the last cholesterol check we did back in April was an improvement from the previous times before that. Patient has been trying to eat a low fat diet and exercise regularly. She says she read that the medication doesn't do much to help lower cholesterol. She says it was also going to be expensive. Patient they says that she has been too busy trying to take care of her husband who is not doing well.

## 2019-09-08 NOTE — Telephone Encounter (Signed)
Please remind patient it is time to check lipids since starting  Colestid in April. Future order is in chart. Needs to be fasting. Thanks.

## 2019-09-08 NOTE — Telephone Encounter (Signed)
Patient is calling back to let Dr Caryn Section know that she never started the colestipol (COLESTID) 5 g granules [[id number]] . She has been focusing her caring for her husband. And did some reading on the medication. And she deceided not to start the medication back in April 2021.  Patient reports that her cholesterol is improving. Please advise if the labs are still needed? CB Patient [phone]

## 2019-09-10 DIAGNOSIS — M810 Age-related osteoporosis without current pathological fracture: Secondary | ICD-10-CM | POA: Diagnosis not present

## 2019-09-19 ENCOUNTER — Encounter: Payer: Self-pay | Admitting: Adult Health

## 2019-09-19 DIAGNOSIS — M2550 Pain in unspecified joint: Secondary | ICD-10-CM | POA: Insufficient documentation

## 2019-10-07 ENCOUNTER — Telehealth: Payer: Self-pay

## 2019-10-07 NOTE — Telephone Encounter (Signed)
Copied from [place] [id number]. Topic: General - Inquiry >> Oct 07, 2019  9:28 AM Mathis Bud wrote: Reason for CRM: Patient is requesting a note for her garbage company stating that she cannot walk down her drive way and that she needs the garbage to pick up at her front door.  Patient states she will call back with address to mail the note for, Call back [phone] >> Oct 07, 2019  9:41 AM Alanda Slim E wrote: The address for the trash company is  [place] [place] [address]  They will be looking for this letter

## 2019-10-09 ENCOUNTER — Other Ambulatory Visit: Payer: Self-pay | Admitting: Family Medicine

## 2019-10-09 DIAGNOSIS — E785 Hyperlipidemia, unspecified: Secondary | ICD-10-CM

## 2019-10-16 ENCOUNTER — Encounter: Payer: Self-pay | Admitting: Physician Assistant

## 2019-10-16 ENCOUNTER — Ambulatory Visit: Payer: Self-pay

## 2019-10-16 ENCOUNTER — Ambulatory Visit (INDEPENDENT_AMBULATORY_CARE_PROVIDER_SITE_OTHER): Payer: Medicare HMO | Admitting: Physician Assistant

## 2019-10-16 DIAGNOSIS — J029 Acute pharyngitis, unspecified: Secondary | ICD-10-CM | POA: Diagnosis not present

## 2019-10-16 NOTE — Telephone Encounter (Signed)
Pt. Started having sore throat x 3 days. No fever. Feels weak. Has had COVID 19 vaccinations. Feels "like it could be allergies." Virtual visit made for today.  Reason for Disposition . SEVERE (e.g., excruciating) throat pain  Answer Assessment - Initial Assessment Questions 1. ONSET: "When did the throat start hurting?" (Hours or days ago)      Started 2-3 days ago 2. SEVERITY: "How bad is the sore throat?" (Scale 1-10; mild, moderate or severe)   - MILD (1-3):  doesn't interfere with eating or normal activities   - MODERATE (4-7): interferes with eating some solids and normal activities   - SEVERE (8-10):  excruciating pain, interferes with most normal activities   - SEVERE DYSPHAGIA: can't swallow liquids, drooling     Mild 3. STREP EXPOSURE: "Has there been any exposure to strep within the past week?" If Yes, ask: "What type of contact occurred?"      No 4.  VIRAL SYMPTOMS: "Are there any symptoms of a cold, such as a runny nose, cough, hoarse voice or red eyes?"      No 5. FEVER: "Do you have a fever?" If Yes, ask: "What is your temperature, how was it measured, and when did it start?"     No 6. PUS ON THE TONSILS: "Is there pus on the tonsils in the back of your throat?"     Unsure 7. OTHER SYMPTOMS: "Do you have any other symptoms?" (e.g., difficulty breathing, headache, rash)     No 8. PREGNANCY: "Is there any chance you are pregnant?" "When was your last menstrual period?"     No  Protocols used: SORE THROAT-A-AH

## 2019-10-16 NOTE — Progress Notes (Signed)
Virtual telephone visit    Virtual Visit via Telephone Note   This visit type was conducted due to national recommendations for restrictions regarding the COVID-19 Pandemic (e.g. social distancing) in an effort to limit this patient's exposure and mitigate transmission in our community. Due to her co-morbid illnesses, this patient is at least at moderate risk for complications without adequate follow up. This format is felt to be most appropriate for this patient at this time. The patient did not have access to video technology or had technical difficulties with video requiring transitioning to audio format only (telephone). Physical exam was limited to content and character of the telephone converstion.    I connected with Laura Love on 10/16/19 at  3:20 PM EDT by telephone and verified that I am speaking with the correct person using two identifiers.  I discussed the limitations of evaluation and management by telemedicine and the availability of in person appointments. The patient expressed understanding and agreed to proceed.  Patient location: Home Provider location: [facility]   Visit Date: 10/16/2019  Today's healthcare provider: Mar Daring, PA-C   No chief complaint on file.  Subjective    Sore Throat  This is a new problem. The current episode started in the past 7 days. Neither side of throat is experiencing more pain than the other. There has been no fever. Associated symptoms include a hoarse voice. Pertinent negatives include no abdominal pain, congestion, coughing, diarrhea, drooling, ear discharge, ear pain, headaches, plugged ear sensation, neck pain, shortness of breath, stridor, swollen glands, trouble swallowing or vomiting. She has had no exposure to strep or mono. She has tried NSAIDs for the symptoms. The treatment provided no relief.   Patient is scheduled on Saturday to have Covid test.   Patient Active Problem List   Diagnosis Date Noted  . Joint  pain 09/19/2019  . Tick bite 08/14/2019  . Cellulitis of left lower extremity 08/14/2019  . History of measles, mumps, or rubella 04/10/2017  . Allergic rhinitis 04/10/2017  . Osteoporosis 04/10/2017  . Shortness of breath 11/17/2016  . History of adenomatous polyp of colon 12/21/2014  . Hypothyroidism 11/08/2010  . Hyperlipemia 10/19/2010  . Bladder incontinence 10/19/2010  . Vaginal prolapse 10/19/2010  . Cervical pain 09/22/2009  . 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 CN (constipation) 07/02/2008  . 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 Cannot sleep 03/10/2007  . Dysthymia 03/10/2007  . Family history of breast cancer 03/10/2007   Past Medical History:  Diagnosis Date  . Clavicular fracture   . Colon polyps   . History of chicken pox   . History of measles   . History of mumps   . Hyperlipidemia   . Hypoglycemia   . Osteoporosis    Social History   Tobacco Use  . Smoking status: Former Smoker    Types: Cigarettes  . Smokeless tobacco: Never Used  . Tobacco comment: quit in 1969  Vaping Use  . Vaping Use: Former  Substance Use Topics  . Alcohol use: No    Alcohol/week: 0.0 standard drinks  . Drug use: No   Allergies  Allergen Reactions  . Alendronate Nausea Only  . Crestor [Rosuvastatin Calcium]   . Duloxetine Nausea Only      Medications: Outpatient Medications Prior to Visit  Medication Sig  . albuterol (VENTOLIN HFA) 108 (90 Base) MCG/ACT inhaler Inhale 2 puffs into the lungs every 6 (six) hours as needed  for wheezing or shortness of breath.  . Biotin 1000 MCG tablet Take 1,000 mcg by mouth daily.    . budesonide-formoterol (SYMBICORT) 160-4.5 MCG/ACT inhaler 1-2 puffs twice daily (Patient taking differently: 1-2 puffs twice daily as needed)  . buPROPion (WELLBUTRIN SR) 150 MG 12 hr tablet TAKE ONE TABLET TWICE DAILY (Patient taking differently: Only takes 1 a day currently)  . CALCIUM PO Take  600 mg by mouth daily. + D3 800 units  . colestipol (COLESTID) 5 g granules Take 5 g by mouth 2 (two) times daily.  Marland Kitchen dicyclomine (BENTYL) 10 MG capsule Take 1 capsule (10 mg total) by mouth 4 (four) times daily -  before meals and at bedtime. As needed for abdominal pain  . DULoxetine (CYMBALTA) 30 MG capsule Take 1 capsule (30 mg total) by mouth daily.  Marland Kitchen ezetimibe (ZETIA) 10 MG tablet TAKE ONE TABLET EVERY DAY  . ibuprofen (ADVIL,MOTRIN) 200 MG tablet Take 200 mg by mouth every 6 (six) hours as needed. 2 tablets every 3 days   . meclizine (ANTIVERT) 25 MG tablet Take 1 tablet (25 mg total) by mouth 3 (three) times daily as needed for dizziness.  . Melatonin 3 MG TABS Take 1 tablet by mouth at bedtime.  . sennosides-docusate sodium (SENOKOT-S) 8.6-50 MG tablet Take 2-3 tablets by mouth 2 (two) times daily. 3 tablets every morning, then 3-4 tablets in the evenings  . SYNTHROID 100 MCG tablet TAKE 1 TABLET BY MOUTH DAILY  . vitamin C (ASCORBIC ACID) 500 MG tablet Take 500 mg by mouth daily.    . Vitamin D, Cholecalciferol, 1000 UNITS TABS Take 2 tablets by mouth daily.  . Zinc 50 MG CAPS Take by mouth.  . zinc gluconate 50 MG tablet Take 50 mg by mouth daily as needed.    . zoledronic acid (RECLAST) 5 MG/100ML SOLN injection Inject 5 mg into the vein. Once yearly starting June 2019 per Dr. Gabriel Carina   No facility-administered medications prior to visit.    Review of Systems  HENT: Positive for hoarse voice. Negative for congestion, drooling, ear discharge, ear pain and trouble swallowing.   Respiratory: Negative for cough, shortness of breath and stridor.   Gastrointestinal: Negative for abdominal pain, diarrhea and vomiting.  Musculoskeletal: Negative for neck pain.  Neurological: Negative for headaches.    Last CBC Lab Results  Component Value Date   WBC 4.4 08/14/2019   HGB 13.8 08/14/2019   HCT 41.7 08/14/2019   MCV 98 (H) 08/14/2019   MCH 32.5 08/14/2019   RDW 11.9 08/14/2019    PLT 206 40/97/3532   Last metabolic panel Lab Results  Component Value Date   GLUCOSE 77 08/14/2019   NA 138 08/14/2019   K 4.0 08/14/2019   CL 100 08/14/2019   CO2 23 08/14/2019   BUN 14 08/14/2019   CREATININE 0.84 08/14/2019   GFRNONAA 67 08/14/2019   GFRAA 77 08/14/2019   CALCIUM 9.2 08/14/2019   PROT 6.4 08/14/2019   ALBUMIN 4.2 08/14/2019   LABGLOB 2.2 08/14/2019   AGRATIO 1.9 08/14/2019   BILITOT 0.4 08/14/2019   ALKPHOS 96 08/14/2019   AST 17 08/14/2019   ALT 21 08/14/2019      Objective    There were no vitals taken for this visit. BP Readings from Last 3 Encounters:  08/14/19 122/70  06/23/19 (!) 142/74  12/24/18 122/60   Wt Readings from Last 3 Encounters:  08/14/19 144 lb (65.3 kg)  06/23/19 145 lb (65.8 kg)  12/24/18  141 lb (64 kg)        Assessment & Plan     1. Sore throat Patient undergoing covid 19 testing on Saturday. Advised to continue symptomatic treatment OTC. May use salt water gargles and chloraseptic spray if needed. Push fluids, Stay well hydrated. Call if covid 19 positive or if symptoms persist.    No follow-ups on file.    I discussed the assessment and treatment plan with the patient. The patient was provided an opportunity to ask questions and all were answered. The patient agreed with the plan and demonstrated an understanding of the instructions.   The patient was advised to call back or seek an in-person evaluation if the symptoms worsen or if the condition fails to improve as anticipated.  I provided 12 minutes of non-face-to-face time during this encounter.  Reynolds Bowl, PA-C, have reviewed all documentation for this visit. The documentation on 10/16/19 for the exam, diagnosis, procedures, and orders are all accurate and complete.  [place] [facility] [phone] (phone) [phone] (fax)  [place]

## 2019-10-24 ENCOUNTER — Other Ambulatory Visit: Payer: Self-pay | Admitting: Family Medicine

## 2019-10-24 DIAGNOSIS — R059 Cough, unspecified: Secondary | ICD-10-CM

## 2019-10-24 DIAGNOSIS — R062 Wheezing: Secondary | ICD-10-CM

## 2019-10-29 DIAGNOSIS — R69 Illness, unspecified: Secondary | ICD-10-CM | POA: Diagnosis not present

## 2019-10-31 DIAGNOSIS — M810 Age-related osteoporosis without current pathological fracture: Secondary | ICD-10-CM | POA: Diagnosis not present

## 2019-11-27 ENCOUNTER — Other Ambulatory Visit: Payer: Self-pay | Admitting: Family Medicine

## 2019-11-27 DIAGNOSIS — F419 Anxiety disorder, unspecified: Secondary | ICD-10-CM

## 2019-11-27 NOTE — Telephone Encounter (Signed)
Requested  medications are  due for refill today yes  Requested medications are on the active medication list yes  Last refill 8/12  Notes to clinic Pharmacy note states pt only taking one a day, not two as ordered, please assess.

## 2019-12-03 DIAGNOSIS — M1711 Unilateral primary osteoarthritis, right knee: Secondary | ICD-10-CM | POA: Diagnosis not present

## 2019-12-10 DIAGNOSIS — M1711 Unilateral primary osteoarthritis, right knee: Secondary | ICD-10-CM | POA: Diagnosis not present

## 2019-12-11 DIAGNOSIS — D2262 Melanocytic nevi of left upper limb, including shoulder: Secondary | ICD-10-CM | POA: Diagnosis not present

## 2019-12-11 DIAGNOSIS — D2271 Melanocytic nevi of right lower limb, including hip: Secondary | ICD-10-CM | POA: Diagnosis not present

## 2019-12-11 DIAGNOSIS — L814 Other melanin hyperpigmentation: Secondary | ICD-10-CM | POA: Diagnosis not present

## 2019-12-11 DIAGNOSIS — L821 Other seborrheic keratosis: Secondary | ICD-10-CM | POA: Diagnosis not present

## 2019-12-11 DIAGNOSIS — X32XXXA Exposure to sunlight, initial encounter: Secondary | ICD-10-CM | POA: Diagnosis not present

## 2019-12-11 DIAGNOSIS — D225 Melanocytic nevi of trunk: Secondary | ICD-10-CM | POA: Diagnosis not present

## 2019-12-11 DIAGNOSIS — D2261 Melanocytic nevi of right upper limb, including shoulder: Secondary | ICD-10-CM | POA: Diagnosis not present

## 2019-12-11 DIAGNOSIS — D2272 Melanocytic nevi of left lower limb, including hip: Secondary | ICD-10-CM | POA: Diagnosis not present

## 2019-12-17 DIAGNOSIS — M1711 Unilateral primary osteoarthritis, right knee: Secondary | ICD-10-CM | POA: Diagnosis not present

## 2019-12-17 DIAGNOSIS — R69 Illness, unspecified: Secondary | ICD-10-CM | POA: Diagnosis not present

## 2020-01-09 ENCOUNTER — Ambulatory Visit
Admission: RE | Admit: 2020-01-09 | Discharge: 2020-01-09 | Disposition: A | Discharge status: Home / Self Care | Payer: Medicare HMO | Attending: Family Medicine | Admitting: Family Medicine

## 2020-01-09 ENCOUNTER — Other Ambulatory Visit: Payer: Self-pay

## 2020-01-09 ENCOUNTER — Ambulatory Visit: Payer: Self-pay

## 2020-01-09 ENCOUNTER — Ambulatory Visit
Admission: RE | Admit: 2020-01-09 | Discharge: 2020-01-09 | Disposition: A | Discharge status: Home / Self Care | Payer: Medicare HMO | Source: Ambulatory Visit | Attending: Family Medicine | Admitting: Family Medicine

## 2020-01-09 ENCOUNTER — Ambulatory Visit (INDEPENDENT_AMBULATORY_CARE_PROVIDER_SITE_OTHER): Payer: Medicare HMO | Admitting: Family Medicine

## 2020-01-09 ENCOUNTER — Encounter: Payer: Self-pay | Admitting: Family Medicine

## 2020-01-09 VITALS — BP 156/82 | HR 72 | Temp 98.5°F | Resp 16 | Wt 141.8 lb

## 2020-01-09 DIAGNOSIS — R0781 Pleurodynia: Secondary | ICD-10-CM

## 2020-01-09 DIAGNOSIS — R0789 Other chest pain: Secondary | ICD-10-CM

## 2020-01-09 DIAGNOSIS — E785 Hyperlipidemia, unspecified: Secondary | ICD-10-CM | POA: Diagnosis not present

## 2020-01-09 MED ORDER — COLESTIPOL HCL 5 G PO GRAN: 5.0000 g | GRANULES | Freq: Two times a day (BID) | ORAL | 12 refills | Status: DC

## 2020-01-09 NOTE — Progress Notes (Signed)
Established patient visit   Patient: Laura Love   DOB: 1940/10/24   79 y.o. Female  MRN: [id number] Visit Date: 01/09/2020  Today's healthcare provider: [name], MD   Chief Complaint  Patient presents with  . Breast Pain   Subjective    HPI  Breast pain: Patient complains of intermittent left breast and underarm pain. Pain started 10 days ago. Pain is aggravated with movement and if she coughs. Not related to exertion. No dyspnea. Pain 3-4/10. No other symptoms. Pain lasts seconds to a couple of minutes. Patient denies any recent injury. She has tried taking Ibuprofen which helps to improve pain. She did have a fall a few weeks ago and fell onto her left side.  Last mammogram was 06/04/2019 and the result was BI-RADS Cat 1.      Medications: Outpatient Medications Prior to Visit  Medication Sig  . albuterol (VENTOLIN HFA) 108 (90 Base) MCG/ACT inhaler Inhale 2 puffs into the lungs every 6 (six) hours as needed for wheezing or shortness of breath.  . Biotin 1000 MCG tablet Take 1,000 mcg by mouth daily.    . budesonide-formoterol (SYMBICORT) 160-4.5 MCG/ACT inhaler 1-2 puffs twice daily as needed  . buPROPion (WELLBUTRIN SR) 150 MG 12 hr tablet TAKE ONE TABLET TWICE DAILY  . CALCIUM PO Take 600 mg by mouth daily. + D3 800 units  . colestipol (COLESTID) 5 g granules Take 5 g by mouth 2 (two) times daily.  Marland Kitchen dicyclomine (BENTYL) 10 MG capsule Take 1 capsule (10 mg total) by mouth 4 (four) times daily -  before meals and at bedtime. As needed for abdominal pain  . DULoxetine (CYMBALTA) 30 MG capsule Take 1 capsule (30 mg total) by mouth daily.  Marland Kitchen ezetimibe (ZETIA) 10 MG tablet TAKE ONE TABLET EVERY DAY  . ibuprofen (ADVIL,MOTRIN) 200 MG tablet Take 200 mg by mouth every 6 (six) hours as needed. 2 tablets every 3 days   . meclizine (ANTIVERT) 25 MG tablet Take 1 tablet (25 mg total) by mouth 3 (three) times daily as needed for dizziness.  . Melatonin 3 MG TABS Take 1  tablet by mouth at bedtime.  . sennosides-docusate sodium (SENOKOT-S) 8.6-50 MG tablet Take 2-3 tablets by mouth 2 (two) times daily. 3 tablets every morning, then 3-4 tablets in the evenings  . SYNTHROID 100 MCG tablet TAKE 1 TABLET BY MOUTH DAILY  . vitamin C (ASCORBIC ACID) 500 MG tablet Take 500 mg by mouth daily.    . Vitamin D, Cholecalciferol, 1000 UNITS TABS Take 2 tablets by mouth daily.  . Zinc 50 MG CAPS Take by mouth.  . zinc gluconate 50 MG tablet Take 50 mg by mouth daily as needed.    . zoledronic acid (RECLAST) 5 MG/100ML SOLN injection Inject 5 mg into the vein. Once yearly starting June 2019 per Dr. Gabriel Carina   No facility-administered medications prior to visit.    Review of Systems  Constitutional: Negative for appetite change, chills, fatigue and fever.  Respiratory: Positive for cough. Negative for chest tightness and shortness of breath.   Cardiovascular: Negative for chest pain and palpitations.  Gastrointestinal: Negative for abdominal pain, nausea and vomiting.  Neurological: Negative for dizziness and weakness.      Objective    BP (!) 156/82 (BP Location: Left Arm, Patient Position: Sitting, Cuff Size: Normal)   Pulse 72   Temp 98.5 F (36.9 C) (Oral)   Resp 16   Wt 141 lb 12.8 oz (  64.3 kg)   BMI 25.94 kg/m    Physical Exam   General: Appearance:     Well developed, well nourished female in no acute distress  Eyes:    PERRL, conjunctiva/corneas clear, EOM's intact       Lungs:     Clear to auscultation bilaterally, respirations unlabored  Heart:    Normal heart rate. Normal rhythm. No murmurs, rubs, or gallops.   MS:   Tender over left upper anterior ribs. Pain reproduced by palpation over this area. Pain reproduced by coughing. No gross deformities.          Assessment & Plan     1. Left-sided chest wall pain Pain is completely reproduced by palpation of left upper anterior ribs and started at some point after a fall onto her left side.   2.  Rib pain on left side  - DG Ribs Unilateral Left; Future  3. Hyperlipidemia, unspecified hyperlipidemia type She was prescribed colestipol several months ago to take in addition to ezetimibe, but prescription does not appear to have been dispensed. Prescription - colestipol (COLESTID) 5 g granules; Take 5 g by mouth 2 (two) times daily.  Dispense: 500 g; Refill: 12      The entirety of the information documented in the History of Present Illness, Review of Systems and Physical Exam were personally obtained by me. Portions of this information were initially documented by the CMA and reviewed by me for thoroughness and accuracy.      [name], MD  [facility] [phone] (phone) [phone] (fax)  [place]

## 2020-01-09 NOTE — Telephone Encounter (Signed)
Pt. Reports she has had left under arm and left breast pain x 10 days. Comes and goes. Hurts with movement and if she coughs. Pain 3-4/10. No other symptoms. Pain lasts seconds to a couple of minutes. Appointment made for today. Reason for Disposition  [1] Chest pain lasting < 5 minutes AND [2] NO chest pain or cardiac symptoms (e.g., breathing difficulty, sweating) now (Exception: chest pains that last only a few seconds)  Answer Assessment - Initial Assessment Questions 1. LOCATION: "Where does it hurt?"       Left under arm and comes across breast 2. RADIATION: "Does the pain go anywhere else?" (e.g., into neck, jaw, arms, back)     Not sure 3. ONSET: "When did the chest pain begin?" (Minutes, hours or days)      10 days ago 4. PATTERN "Does the pain come and go, or has it been constant since it started?"  "Does it get worse with exertion?"      Comes and goes 5. DURATION: "How long does it last" (e.g., seconds, minutes, hours)     Seconds - minutes 6. SEVERITY: "How bad is the pain?"  (e.g., Scale 1-10; mild, moderate, or severe)    - MILD (1-3): doesn't interfere with normal activities     - MODERATE (4-7): interferes with normal activities or awakens from sleep    - SEVERE (8-10): excruciating pain, unable to do any normal activities       3-4 7. CARDIAC RISK FACTORS: "Do you have any history of heart problems or risk factors for heart disease?" (e.g., angina, prior heart attack; diabetes, high blood pressure, high cholesterol, smoker, or strong family history of heart disease)     High cholesterol 8. PULMONARY RISK FACTORS: "Do you have any history of lung disease?"  (e.g., blood clots in lung, asthma, emphysema, birth control pills)     No 9. CAUSE: "What do you think is causing the chest pain?"     Unsure 10. OTHER SYMPTOMS: "Do you have any other symptoms?" (e.g., dizziness, nausea, vomiting, sweating, fever, difficulty breathing, cough)       No 11. PREGNANCY: "Is there any  chance you are pregnant?" "When was your last menstrual period?"       No  Protocols used: CHEST PAIN-A-AH

## 2020-02-09 DIAGNOSIS — H2513 Age-related nuclear cataract, bilateral: Secondary | ICD-10-CM | POA: Diagnosis not present

## 2020-03-19 ENCOUNTER — Other Ambulatory Visit: Payer: Self-pay | Admitting: Family Medicine

## 2020-03-19 DIAGNOSIS — E785 Hyperlipidemia, unspecified: Secondary | ICD-10-CM

## 2020-03-25 ENCOUNTER — Ambulatory Visit: Payer: Medicare HMO

## 2020-04-19 NOTE — Progress Notes (Signed)
Subjective:   LUCREZIA DEHNE is a 80 y.o. female who presents for Medicare Annual (Subsequent) preventive examination.   Review of Systems    N/A  Cardiac Risk Factors include: advanced age (>23men, >42 women);dyslipidemia     Objective:    Today's Vitals   04/20/20 1455  BP: (!) 146/78  Pulse: 74  Temp: 98.2 F (36.8 C)  TempSrc: Oral  SpO2: 96%  Weight: 139 lb 9.6 oz (63.3 kg)  Height: 5\' 2"  (1.575 m)  PainSc: 0-No pain   Body mass index is 25.53 kg/m.  Advanced Directives 04/20/2020 03/24/2019  Does Patient Have a Medical Advance Directive? Yes Yes  Type of Paramedic of [place];Living will [place];Living will  Copy of [place] in Chart? No - copy requested No - copy requested    Current Medications (verified) Outpatient Encounter Medications as of 04/20/2020  Medication Sig   acetaminophen (TYLENOL) 325 MG tablet Take 650 mg by mouth every 6 (six) hours as needed. 1 a day   albuterol (VENTOLIN HFA) 108 (90 Base) MCG/ACT inhaler Inhale 2 puffs into the lungs every 6 (six) hours as needed for wheezing or shortness of breath.   Biotin 1000 MCG tablet Take 1,000 mcg by mouth daily.   budesonide-formoterol (SYMBICORT) 160-4.5 MCG/ACT inhaler 1-2 puffs twice daily as needed   buPROPion (WELLBUTRIN SR) 150 MG 12 hr tablet TAKE ONE TABLET TWICE DAILY   CALCIUM PO Take 600 mg by mouth 2 (two) times daily. + D3 800 units   colestipol (COLESTID) 5 g granules Take 5 g by mouth 2 (two) times daily.   dicyclomine (BENTYL) 10 MG capsule Take 1 capsule (10 mg total) by mouth 4 (four) times daily -  before meals and at bedtime. As needed for abdominal pain   diphenhydramine-acetaminophen (TYLENOL PM) 25-500 MG TABS tablet Take 1 tablet by mouth at bedtime.   ezetimibe (ZETIA) 10 MG tablet Take 1 tablet (10 mg total) by mouth daily.   ibuprofen (ADVIL,MOTRIN) 200 MG tablet Take 200 mg by mouth every 6 (six)  hours as needed. 1 tablet daily   meclizine (ANTIVERT) 25 MG tablet Take 1 tablet (25 mg total) by mouth 3 (three) times daily as needed for dizziness.   Melatonin 3 MG TABS Take 1 tablet by mouth at bedtime.   sennosides-docusate sodium (SENOKOT-S) 8.6-50 MG tablet Take 2-3 tablets by mouth 2 (two) times daily. 3 tablets every morning, then 3-4 tablets in the evenings   SYNTHROID 100 MCG tablet TAKE 1 TABLET BY MOUTH DAILY   vitamin C (ASCORBIC ACID) 500 MG tablet Take 500 mg by mouth daily.   Vitamin D, Cholecalciferol, 1000 UNITS TABS Take 2 tablets by mouth daily.   zinc gluconate 50 MG tablet Take 50 mg by mouth daily as needed.   DULoxetine (CYMBALTA) 30 MG capsule Take 1 capsule (30 mg total) by mouth daily. (Patient not taking: Reported on 04/20/2020)   Zinc 50 MG CAPS Take by mouth. (Patient not taking: Reported on 04/20/2020)   zoledronic acid (RECLAST) 5 MG/100ML SOLN injection Inject 5 mg into the vein. Once yearly starting June 2019 per Dr. Gabriel Carina (Patient not taking: Reported on 04/20/2020)   No facility-administered encounter medications on file as of 04/20/2020.    Allergies (verified) Alendronate, Crestor [rosuvastatin calcium], Duloxetine, and Pentazocine   History: Past Medical History:  Diagnosis Date   Clavicular fracture    Colon polyps    History of chicken pox  History of measles    History of mumps    Hyperlipidemia    Hypoglycemia    Osteoporosis    Past Surgical History:  Procedure Laterality Date   ABDOMINAL HYSTERECTOMY  1988   for endometriosis   BREAST BIOPSY Right 2004   CLOSED REDUCTION FACIAL FRACTURE     Family History  Problem Relation Age of Onset   Heart attack Father    Leukemia Father    Hyperlipidemia Mother    Hypertension Mother    Breast cancer Mother    Hyperlipidemia Sister    Breast cancer Sister    Hyperlipidemia Brother    Social History   Socioeconomic History   Marital status: Married     Spouse name: Not on file   Number of children: 2   Years of education: Not on file   Highest education level: Associate degree: occupational, Hotel manager, or vocational program  Occupational History   Occupation: Retired  Tobacco Use   Smoking status: Former Smoker    Types: Cigarettes   Smokeless tobacco: Never Used   Tobacco comment: quit in 1969  Vaping Use   Vaping Use: Never used  Substance and Sexual Activity   Alcohol use: No    Alcohol/week: 0.0 standard drinks   Drug use: No   Sexual activity: Not on file  Other Topics Concern   Not on file  Social History Narrative   Lives with husband in [place], [place]. Raised horses.   Social Determinants of Health   Financial Resource Strain: Low Risk    Difficulty of Paying Living Expenses: Not hard at all  Food Insecurity: No Food Insecurity   Worried About Charity fundraiser in the Last Year: Never true   [place] in the Last Year: Never true  Transportation Needs: No Transportation Needs   Lack of Transportation (Medical): No   Lack of Transportation (Non-Medical): No  Physical Activity: Inactive   Days of Exercise per Week: 0 days   Minutes of Exercise per Session: 0 min  Stress: Stress Concern Present   Feeling of Stress : To some extent  Social Connections: Moderately Isolated   Frequency of Communication with Friends and Family: More than three times a week   Frequency of Social Gatherings with Friends and Family: More than three times a week   Attends Religious Services: Never   Marine scientist or Organizations: No   Attends Music therapist: Never   Marital Status: Married    Tobacco Counseling Counseling given: Not Answered Comment: quit in 1969   Clinical Intake:  Pre-visit preparation completed: Yes  Pain : No/denies pain Pain Score: 0-No pain     Nutritional Status: BMI 25 -29 Overweight Nutritional Risks: None Diabetes: No  How often do you  need to have someone help you when you read instructions, pamphlets, or other written materials from your doctor or pharmacy?: 1 - Never  Diabetic? No  Interpreter Needed?: No  Information entered by :: [facility], LPN   Activities of Daily Living In your present state of health, do you have any difficulty performing the following activities: 04/20/2020  Hearing? Y  Comment Does not wear hearing aids.  Vision? N  Difficulty concentrating or making decisions? N  Walking or climbing stairs? Y  Comment Due to knee and back pains.  Dressing or bathing? N  Doing errands, shopping? N  Preparing Food and eating ? N  Using the Toilet? N  In the past six  months, have you accidently leaked urine? N  Do you have problems with loss of bowel control? N  Managing your Medications? N  Managing your Finances? N  Housekeeping or managing your Housekeeping? N  Some recent data might be hidden    Patient Care Team: Birdie Sons, MD as PCP - General (Family Medicine) Thelma Comp, MD as Referring Physician (Gastroenterology) Lattie Corns, PA-C as Physician Assistant (Physician Assistant) Oneta Rack, MD (Dermatology) Warnell Forester, NP as Nurse Practitioner (Surgery) Solum, Betsey Holiday, MD as Physician Assistant (Endocrinology) Lonia Farber, MD as Consulting Physician (Internal Medicine) Anell Barr, OD (Optometry)  Indicate any recent Medical Services you may have received from other than [facility] providers in the past year (date may be approximate).     Assessment:   This is a routine wellness examination for [place].  Hearing/Vision screen No exam data present  Dietary issues and exercise activities discussed: Current Exercise Habits: The patient does not participate in regular exercise at present, Exercise limited by: None identified  Goals     DIET - INCREASE WATER INTAKE     Recommend to drink at least 6-8 8oz glasses of water per day.      Depression  Screen PHQ 2/9 Scores 04/20/2020 03/24/2019 03/24/2019 03/24/2019 04/13/2017 02/10/2016 02/10/2016  PHQ - 2 Score 0 1 1 0 0 0 0  PHQ- 9 Score - - - - 1 0 -    Fall Risk Fall Risk  04/20/2020 03/24/2019 04/13/2017 02/10/2016 12/21/2014  Falls in the past year? 0 1 Yes No No  Number falls in past yr: 0 0 2 or more - -  Comment - - 3-4 - -  Injury with Fall? 0 0 Yes - -  Follow up - Falls prevention discussed - - -    FALL RISK PREVENTION PERTAINING TO THE HOME:  Any stairs in or around the home? Yes  If so, are there any without handrails? No  Home free of loose throw rugs in walkways, pet beds, electrical cords, etc? Yes  Adequate lighting in your home to reduce risk of falls? Yes   ASSISTIVE DEVICES UTILIZED TO PREVENT FALLS:  Life alert? No  Use of a cane, walker or w/c? No  Grab bars in the bathroom? Yes  Shower chair or bench in shower? No  Elevated toilet seat or a handicapped toilet? Yes    Cognitive Function: Normal cognitive status assessed by observation by this Nurse Health Advisor. No abnormalities found.          Immunizations Immunization History  Administered Date(s) Administered   Fluad Quad(high Dose 65+) 11/05/2018   H1N1 12/21/2007   Influenza Split 12/30/2008, 12/11/2009, 12/22/2010   Influenza, High Dose Seasonal PF 12/30/2014, 12/02/2015, 10/25/2016, 11/20/2017, 10/29/2019   Influenza,inj,quad, With Preservative 11/27/2016   PFIZER Comirnaty(Gray Top)Covid-19 Tri-Sucrose Vaccine 04/02/2019, 04/23/2019, 11/27/2019   Pneumococcal Conjugate-13 12/02/2015   Pneumococcal Polysaccharide-23 01/03/2008   Td 05/06/2007   Tdap 11/17/2011    TDAP status: Up to date  Flu Vaccine status: Up to date  Pneumococcal vaccine status: Up to date  Covid-19 vaccine status: Completed vaccines  Qualifies for Shingles Vaccine? Yes   Zostavax completed No   Shingrix Completed?: No.    Education has been provided regarding the importance of this vaccine.  Patient has been advised to call insurance company to determine out of pocket expense if they have not yet received this vaccine. Advised may also receive vaccine at local pharmacy or Health Dept.  Verbalized acceptance and understanding.  Screening Tests Health Maintenance  Topic Date Due   COLONOSCOPY (Pts 45-70yrs Insurance coverage will need to be confirmed)  01/15/2021   DEXA SCAN  09/01/2021   TETANUS/TDAP  11/16/2021   INFLUENZA VACCINE  Completed   COVID-19 Vaccine  Completed   PNA vac Low Risk Adult  Completed    Health Maintenance  There are no preventive care reminders to display for this patient.  Colorectal cancer screening: Type of screening: Colonoscopy. Completed 01/15/18. Repeat every 3 years  Mammogram status: No longer required due to age.  Bone Density status: Completed 09/02/19. Results reflect: Bone density results: OSTEOPOROSIS. Repeat every 2 years.  Lung Cancer Screening: (Low Dose CT Chest recommended if Age 4-80 years, 30 pack-year currently smoking OR have quit w/in 15years.) does not qualify.    Additional Screening:  Vision Screening: Recommended annual ophthalmology exams for early detection of glaucoma and other disorders of the eye. Is the patient up to date with their annual eye exam?  Yes  Who is the provider or what is the name of the office in which the patient attends annual eye exams? Dr Ellin Mayhew If pt is not established with a provider, would they like to be referred to a provider to establish care? No .   Dental Screening: Recommended annual dental exams for proper oral hygiene  Community Resource Referral / Chronic Care Management: CRR required this visit?  No   CCM required this visit?  No      Plan:     I have personally reviewed and noted the following in the patients chart:    Medical and social history  Use of alcohol, tobacco or illicit drugs   Current medications and supplements  Functional ability and  status  Nutritional status  Physical activity  Advanced directives  List of other physicians  Hospitalizations, surgeries, and ER visits in previous 12 months  Vitals  Screenings to include cognitive, depression, and falls  Referrals and appointments  In addition, I have reviewed and discussed with patient certain preventive protocols, quality metrics, and best practice recommendations. A written personalized care plan for preventive services as well as general preventive health recommendations were provided to patient.     Teddie Curd [place], [place]   [id number]   Nurse Notes: None.

## 2020-04-20 ENCOUNTER — Ambulatory Visit (INDEPENDENT_AMBULATORY_CARE_PROVIDER_SITE_OTHER): Payer: Medicare HMO

## 2020-04-20 ENCOUNTER — Other Ambulatory Visit: Payer: Self-pay

## 2020-04-20 VITALS — BP 146/78 | HR 74 | Temp 98.2°F | Ht 62.0 in | Wt 139.6 lb

## 2020-04-20 DIAGNOSIS — Z0000 Encounter for general adult medical examination without abnormal findings: Secondary | ICD-10-CM | POA: Diagnosis not present

## 2020-04-20 NOTE — Patient Instructions (Signed)
Laura Love , Thank you for taking time to come for your Medicare Wellness Visit. I appreciate your ongoing commitment to your health goals. Please review the following plan we discussed and let me know if I can assist you in the future.   Screening recommendations/referrals: Colonoscopy: Up to date, due 12/2020 Mammogram: No longer required.  Bone Density: Up to date, due 08/2021 Recommended yearly ophthalmology/optometry visit for glaucoma screening and checkup Recommended yearly dental visit for hygiene and checkup  Vaccinations: Influenza vaccine: Done 10/2019 Pneumococcal vaccine: Completed series Tdap vaccine: Up to date, due 10/2021 Shingles vaccine: Shingrix discussed. Please contact your pharmacy for coverage information.     Advanced directives: Please bring a copy of your POA (Power of Attorney) and/or Living Will to your next appointment.   Conditions/risks identified: Recommend to drink at least 6-8 8oz glasses of water per day.  Next appointment: 05/04/20 @ 4:00 PM   Preventive Care 65 Years and Older, Female Preventive care refers to lifestyle choices and visits with your health care provider that can promote health and wellness. What does preventive care include?  A yearly physical exam. This is also called an annual well check.  Dental exams once or twice a year.  Routine eye exams. Ask your health care provider how often you should have your eyes checked.  Personal lifestyle choices, including:  Daily care of your teeth and gums.  Regular physical activity.  Eating a healthy diet.  Avoiding tobacco and drug use.  Limiting alcohol use.  Practicing safe sex.  Taking low-dose aspirin every day.  Taking vitamin and mineral supplements as recommended by your health care provider. What happens during an annual well check? The services and screenings done by your health care provider during your annual well check will depend on your age, overall health,  lifestyle risk factors, and family history of disease. Counseling  Your health care provider may ask you questions about your:  Alcohol use.  Tobacco use.  Drug use.  Emotional well-being.  Home and relationship well-being.  Sexual activity.  Eating habits.  History of falls.  Memory and ability to understand (cognition).  Work and work Statistician.  Reproductive health. Screening  You may have the following tests or measurements:  Height, weight, and BMI.  Blood pressure.  Lipid and cholesterol levels. These may be checked every 5 years, or more frequently if you are over 7 years old.  Skin check.  Lung cancer screening. You may have this screening every year starting at age 34 if you have a 30-pack-year history of smoking and currently smoke or have quit within the past 15 years.  Fecal occult blood test (FOBT) of the stool. You may have this test every year starting at age 21.  Flexible sigmoidoscopy or colonoscopy. You may have a sigmoidoscopy every 5 years or a colonoscopy every 10 years starting at age 86.  Hepatitis C blood test.  Hepatitis B blood test.  Sexually transmitted disease (STD) testing.  Diabetes screening. This is done by checking your blood sugar (glucose) after you have not eaten for a while (fasting). You may have this done every 1-3 years.  Bone density scan. This is done to screen for osteoporosis. You may have this done starting at age 77.  Mammogram. This may be done every 1-2 years. Talk to your health care provider about how often you should have regular mammograms. Talk with your health care provider about your test results, treatment options, and if necessary, the need for  more tests. Vaccines  Your health care provider may recommend certain vaccines, such as:  Influenza vaccine. This is recommended every year.  Tetanus, diphtheria, and acellular pertussis (Tdap, Td) vaccine. You may need a Td booster every 10 years.  Zoster  vaccine. You may need this after age 62.  Pneumococcal 13-valent conjugate (PCV13) vaccine. One dose is recommended after age 79.  Pneumococcal polysaccharide (PPSV23) vaccine. One dose is recommended after age 70. Talk to your health care provider about which screenings and vaccines you need and how often you need them. This information is not intended to replace advice given to you by your health care provider. Make sure you discuss any questions you have with your health care provider. Document Released: 03/12/2015 Document Revised: 11/03/2015 Document Reviewed: 12/15/2014 Elsevier Interactive Patient Education  2017 [employer] Prevention in the Home Falls can cause injuries. They can happen to people of all ages. There are many things you can do to make your home safe and to help prevent falls. What can I do on the outside of my home?  Regularly fix the edges of walkways and driveways and fix any cracks.  Remove anything that might make you trip as you walk through a door, such as a raised step or threshold.  Trim any bushes or trees on the path to your home.  Use bright outdoor lighting.  Clear any walking paths of anything that might make someone trip, such as rocks or tools.  Regularly check to see if handrails are loose or broken. Make sure that both sides of any steps have handrails.  Any raised decks and porches should have guardrails on the edges.  Have any leaves, snow, or ice cleared regularly.  Use sand or salt on walking paths during winter.  Clean up any spills in your garage right away. This includes oil or grease spills. What can I do in the bathroom?  Use night lights.  Install grab bars by the toilet and in the tub and shower. Do not use towel bars as grab bars.  Use non-skid mats or decals in the tub or shower.  If you need to sit down in the shower, use a plastic, non-slip stool.  Keep the floor dry. Clean up any water that spills on the  floor as soon as it happens.  Remove soap buildup in the tub or shower regularly.  Attach bath mats securely with double-sided non-slip rug tape.  Do not have throw rugs and other things on the floor that can make you trip. What can I do in the bedroom?  Use night lights.  Make sure that you have a light by your bed that is easy to reach.  Do not use any sheets or blankets that are too big for your bed. They should not hang down onto the floor.  Have a firm chair that has side arms. You can use this for support while you get dressed.  Do not have throw rugs and other things on the floor that can make you trip. What can I do in the kitchen?  Clean up any spills right away.  Avoid walking on wet floors.  Keep items that you use a lot in easy-to-reach places.  If you need to reach something above you, use a strong step stool that has a grab bar.  Keep electrical cords out of the way.  Do not use floor polish or wax that makes floors slippery. If you must use wax, use non-skid  floor wax.  Do not have throw rugs and other things on the floor that can make you trip. What can I do with my stairs?  Do not leave any items on the stairs.  Make sure that there are handrails on both sides of the stairs and use them. Fix handrails that are broken or loose. Make sure that handrails are as long as the stairways.  Check any carpeting to make sure that it is firmly attached to the stairs. Fix any carpet that is loose or worn.  Avoid having throw rugs at the top or bottom of the stairs. If you do have throw rugs, attach them to the floor with carpet tape.  Make sure that you have a light switch at the top of the stairs and the bottom of the stairs. If you do not have them, ask someone to add them for you. What else can I do to help prevent falls?  Wear shoes that:  Do not have high heels.  Have rubber bottoms.  Are comfortable and fit you well.  Are closed at the toe. Do not wear  sandals.  If you use a stepladder:  Make sure that it is fully opened. Do not climb a closed stepladder.  Make sure that both sides of the stepladder are locked into place.  Ask someone to hold it for you, if possible.  Clearly mark and make sure that you can see:  Any grab bars or handrails.  First and last steps.  Where the edge of each step is.  Use tools that help you move around (mobility aids) if they are needed. These include:  Canes.  Walkers.  Scooters.  Crutches.  Turn on the lights when you go into a dark area. Replace any light bulbs as soon as they burn out.  Set up your furniture so you have a clear path. Avoid moving your furniture around.  If any of your floors are uneven, fix them.  If there are any pets around you, be aware of where they are.  Review your medicines with your doctor. Some medicines can make you feel dizzy. This can increase your chance of falling. Ask your doctor what other things that you can do to help prevent falls. This information is not intended to replace advice given to you by your health care provider. Make sure you discuss any questions you have with your health care provider. Document Released: 12/10/2008 Document Revised: 07/22/2015 Document Reviewed: 03/20/2014 Elsevier Interactive Patient Education  2017 [employer].

## 2020-05-04 ENCOUNTER — Other Ambulatory Visit: Payer: Self-pay

## 2020-05-04 ENCOUNTER — Ambulatory Visit (INDEPENDENT_AMBULATORY_CARE_PROVIDER_SITE_OTHER): Payer: Medicare HMO | Admitting: Family Medicine

## 2020-05-04 VITALS — BP 150/74 | HR 69 | Temp 97.9°F | Ht 62.0 in | Wt 139.0 lb

## 2020-05-04 DIAGNOSIS — E785 Hyperlipidemia, unspecified: Secondary | ICD-10-CM | POA: Diagnosis not present

## 2020-05-04 DIAGNOSIS — H6123 Impacted cerumen, bilateral: Secondary | ICD-10-CM | POA: Diagnosis not present

## 2020-05-04 DIAGNOSIS — M810 Age-related osteoporosis without current pathological fracture: Secondary | ICD-10-CM | POA: Diagnosis not present

## 2020-05-04 DIAGNOSIS — E039 Hypothyroidism, unspecified: Secondary | ICD-10-CM | POA: Diagnosis not present

## 2020-05-04 DIAGNOSIS — R030 Elevated blood-pressure reading, without diagnosis of hypertension: Secondary | ICD-10-CM | POA: Diagnosis not present

## 2020-05-04 NOTE — Patient Instructions (Addendum)
.   Please review the attached list of medications and notify my office if there are any errors.   . Try OTC Debrox drops to help clear the wax from your ears.

## 2020-05-04 NOTE — Progress Notes (Signed)
Established patient visit   Patient: Laura Love   DOB: 01/30/1941   80 y.o. Female  MRN: [id number] Visit Date: 05/04/2020  Today's healthcare provider: [name], MD   No chief complaint on file.  Subjective    HPI   Patient reports she has bone density test last year through orthopedist at [facility] for history of osteoporosis, but could not be properly compared to previous BMD at [facility] breast center because it was done on the left hip instead of the right. She wants to have it redone on the correct side, however it's only been 8 months.   Blood pressure, follow-up  BP Readings from Last 3 Encounters:  04/20/20 (!) 146/78  01/09/20 (!) 156/82  08/14/19 122/70   Wt Readings from Last 3 Encounters:  04/20/20 139 lb 9.6 oz (63.3 kg)  01/09/20 141 lb 12.8 oz (64.3 kg)  08/14/19 144 lb (65.3 kg)     She was last seen for blood pressure 4 months ago.  BP at that visit was 156/82. Management since that visit includes n/a.  She reports n/a compliance with treatment. She is not having side effects.  She is following a Regular diet. She is not exercising. She does not smoke.   Use of agents associated with hypertension: none.   Outside blood pressures are 125/60s. Symptoms: No chest pain No chest pressure  No palpitations No syncope  No dyspnea No orthopnea  No paroxysmal nocturnal dyspnea No lower extremity edema   Pertinent labs: Lab Results  Component Value Date   CHOL 238 (H) 06/10/2019   HDL 71 06/10/2019   LDLCALC 148 (H) 06/10/2019   LDLDIRECT 159.3 10/24/2010   TRIG 110 06/10/2019   CHOLHDL 3.4 06/10/2019   Lab Results  Component Value Date   NA 138 08/14/2019   K 4.0 08/14/2019   CREATININE 0.84 08/14/2019   GFRNONAA 67 08/14/2019   GFRAA 77 08/14/2019   GLUCOSE 77 08/14/2019     The 10-year ASCVD risk score Mikey Bussing DC Jr., et al., 2013) is: 30.9%    --------------------------------------------------------------------------------------------------- Lipid/Cholesterol, Follow-up  Last lipid panel Other pertinent labs  Lab Results  Component Value Date   CHOL 238 (H) 06/10/2019   HDL 71 06/10/2019   LDLCALC 148 (H) 06/10/2019   LDLDIRECT 159.3 10/24/2010   TRIG 110 06/10/2019   CHOLHDL 3.4 06/10/2019   Lab Results  Component Value Date   ALT 21 08/14/2019   AST 17 08/14/2019   PLT 206 08/14/2019   TSH 2.360 06/10/2019     She was last seen for this 4 months ago.  Management since that visit includes - She was prescribed colestipol several months ago to take in addition to ezetimibe, but prescription does not appear to have been dispensed. Prescription - colestipol (COLESTID) 5 g granules; Take 5 g by mouth 2 (two) times daily.  Dispense: 500 g; Refill: 12 .  She reports poor compliance with treatment. She is not having side effects.   Symptoms: No chest pain No chest pressure/discomfort  No dyspnea No lower extremity edema  Yes numbness or tingling of extremity No orthopnea  No palpitations No paroxysmal nocturnal dyspnea  No speech difficulty No syncope   Current diet: in general, a "healthy" diet   Current exercise: none  The 10-year ASCVD risk score Mikey Bussing DC Jr., et al., 2013) is: 30.9%  --------------------------------------------------------------------------------------------------- She also states it feels like she has wax built up in her ears and wants them looked  at.     Medications: Outpatient Medications Prior to Visit  Medication Sig   acetaminophen (TYLENOL) 325 MG tablet Take 650 mg by mouth every 6 (six) hours as needed. 1 a day   albuterol (VENTOLIN HFA) 108 (90 Base) MCG/ACT inhaler Inhale 2 puffs into the lungs every 6 (six) hours as needed for wheezing or shortness of breath.   Biotin 1000 MCG tablet Take 1,000 mcg by mouth daily.   budesonide-formoterol (SYMBICORT) 160-4.5 MCG/ACT inhaler  1-2 puffs twice daily as needed   buPROPion (WELLBUTRIN SR) 150 MG 12 hr tablet TAKE ONE TABLET TWICE DAILY   CALCIUM PO Take 600 mg by mouth 2 (two) times daily. + D3 800 units   colestipol (COLESTID) 5 g granules Take 5 g by mouth 2 (two) times daily.   dicyclomine (BENTYL) 10 MG capsule Take 1 capsule (10 mg total) by mouth 4 (four) times daily -  before meals and at bedtime. As needed for abdominal pain   diphenhydramine-acetaminophen (TYLENOL PM) 25-500 MG TABS tablet Take 1 tablet by mouth at bedtime.   DULoxetine (CYMBALTA) 30 MG capsule Take 1 capsule (30 mg total) by mouth daily. (Patient not taking: Reported on 04/20/2020)   ezetimibe (ZETIA) 10 MG tablet Take 1 tablet (10 mg total) by mouth daily.   ibuprofen (ADVIL,MOTRIN) 200 MG tablet Take 200 mg by mouth every 6 (six) hours as needed. 1 tablet daily   meclizine (ANTIVERT) 25 MG tablet Take 1 tablet (25 mg total) by mouth 3 (three) times daily as needed for dizziness.   Melatonin 3 MG TABS Take 1 tablet by mouth at bedtime.   sennosides-docusate sodium (SENOKOT-S) 8.6-50 MG tablet Take 2-3 tablets by mouth 2 (two) times daily. 3 tablets every morning, then 3-4 tablets in the evenings   SYNTHROID 100 MCG tablet TAKE 1 TABLET BY MOUTH DAILY   vitamin C (ASCORBIC ACID) 500 MG tablet Take 500 mg by mouth daily.   Vitamin D, Cholecalciferol, 1000 UNITS TABS Take 2 tablets by mouth daily.   Zinc 50 MG CAPS Take by mouth. (Patient not taking: Reported on 04/20/2020)   zinc gluconate 50 MG tablet Take 50 mg by mouth daily as needed.   zoledronic acid (RECLAST) 5 MG/100ML SOLN injection Inject 5 mg into the vein. Once yearly starting June 2019 per Dr. Gabriel Carina (Patient not taking: Reported on 04/20/2020)   No facility-administered medications prior to visit.        Objective    BP (!) 150/74 (BP Location: Right Arm, Patient Position: Sitting, Cuff Size: Normal)    Pulse 69    Temp 97.9 F (36.6 C) (Temporal)    Ht 5\' 2"   (1.575 m)    Wt 139 lb (63 kg)    SpO2 100%    BMI 25.42 kg/m     Physical Exam   General Appearance:     Well developed, well nourished female, alert, cooperative, in no acute distress  HENT:   Small amount of cerumen in both ear canals. Right side nearly impacted.   Neurologic:   Awake, alert, oriented x 3. No apparent focal neurological           defect.         Assessment & Plan     1. Hypothyroidism, unspecified type  - TSH  2. Excessive cerumen in both ear canals Recommend OTC Debrox, consider ENT referral if not helping.   3. Hyperlipidemia, unspecified hyperlipidemia type Doing well on ezetimibe, but Cholestyramine tables are  too expensive, and she doesn't tolerate the granules.  - CBC - Comprehensive metabolic panel - Lipid panel  4. Osteoporosis, unspecified osteoporosis type, unspecified pathological fracture presence Intolerant to alendronate. Continue vitamin D supplementation due for BMD in July. Will have done at [facility] breast center.  - VITAMIN D 25 Hydroxy (Vit-D Deficiency, Fractures)   5. Elevated blood pressure She has been under quite a bit of stress lately. She is to monitor BP at home, try to get more exercise and avoid salty foods.       The entirety of the information documented in the History of Present Illness, Review of Systems and Physical Exam were personally obtained by me. Portions of this information were initially documented by the CMA and reviewed by me for thoroughness and accuracy.      [name], MD  [facility] [phone] (phone) [phone] (fax)  [place]

## 2020-05-05 ENCOUNTER — Other Ambulatory Visit: Payer: Self-pay | Admitting: Family Medicine

## 2020-05-05 DIAGNOSIS — Z1231 Encounter for screening mammogram for malignant neoplasm of breast: Secondary | ICD-10-CM

## 2020-05-06 DIAGNOSIS — E039 Hypothyroidism, unspecified: Secondary | ICD-10-CM | POA: Diagnosis not present

## 2020-05-06 DIAGNOSIS — M810 Age-related osteoporosis without current pathological fracture: Secondary | ICD-10-CM | POA: Diagnosis not present

## 2020-05-06 DIAGNOSIS — E785 Hyperlipidemia, unspecified: Secondary | ICD-10-CM | POA: Diagnosis not present

## 2020-05-07 LAB — COMPREHENSIVE METABOLIC PANEL
ALT: 16 IU/L (ref 0–32)
AST: 14 IU/L (ref 0–40)
Albumin/Globulin Ratio: 2.8 — ABNORMAL HIGH (ref 1.2–2.2)
Albumin: 4.4 g/dL (ref 3.7–4.7)
Alkaline Phosphatase: 69 IU/L (ref 44–121)
BUN/Creatinine Ratio: 14 (ref 12–28)
BUN: 13 mg/dL (ref 8–27)
Bilirubin Total: 0.5 mg/dL (ref 0.0–1.2)
CO2: 21 mmol/L (ref 20–29)
Calcium: 8.6 mg/dL — ABNORMAL LOW (ref 8.7–10.3)
Chloride: 104 mmol/L (ref 96–106)
Creatinine, Ser: 0.9 mg/dL (ref 0.57–1.00)
Globulin, Total: 1.6 g/dL (ref 1.5–4.5)
Glucose: 89 mg/dL (ref 65–99)
Potassium: 4.3 mmol/L (ref 3.5–5.2)
Sodium: 139 mmol/L (ref 134–144)
Total Protein: 6 g/dL (ref 6.0–8.5)
eGFR: 65 mL/min/{1.73_m2} (ref >59)

## 2020-05-07 LAB — TSH: TSH: 2.23 u[IU]/mL (ref 0.450–4.500)

## 2020-05-07 LAB — CBC
Hematocrit: 38.2 % (ref 34.0–46.6)
Hemoglobin: 13 g/dL (ref 11.1–15.9)
MCH: 32.6 pg (ref 26.6–33.0)
MCHC: 34 g/dL (ref 31.5–35.7)
MCV: 96 fL (ref 79–97)
Platelets: 225 10*3/uL (ref 150–450)
RBC: 3.99 x10E6/uL (ref 3.77–5.28)
RDW: 12.1 % (ref 11.7–15.4)
WBC: 4.2 10*3/uL (ref 3.4–10.8)

## 2020-05-07 LAB — LIPID PANEL
Chol/HDL Ratio: 3.6 ratio (ref 0.0–4.4)
Cholesterol, Total: 222 mg/dL — ABNORMAL HIGH (ref 100–199)
HDL: 61 mg/dL (ref >39)
LDL Chol Calc (NIH): 142 mg/dL — ABNORMAL HIGH (ref 0–99)
Triglycerides: 108 mg/dL (ref 0–149)
VLDL Cholesterol Cal: 19 mg/dL (ref 5–40)

## 2020-05-07 LAB — VITAMIN D 25 HYDROXY (VIT D DEFICIENCY, FRACTURES): Vit D, 25-Hydroxy: 51.5 ng/mL (ref 30.0–100.0)

## 2020-05-10 ENCOUNTER — Other Ambulatory Visit: Payer: Self-pay

## 2020-05-10 DIAGNOSIS — E785 Hyperlipidemia, unspecified: Secondary | ICD-10-CM

## 2020-05-10 MED ORDER — FENOFIBRATE 160 MG PO TABS: 160.0000 mg | ORAL_TABLET | ORAL | 1 refills | Status: DC

## 2020-05-10 NOTE — Telephone Encounter (Signed)
I think she is talking about the fenofibrate. Looks like its been about 10 years since she's been on that. OK to send prescription to her pharmacy if that's the one. Should check lipids after she has taken in for about 3 months.

## 2020-05-10 NOTE — Telephone Encounter (Signed)
-----   Message from Birdie Sons, MD sent at 05/07/2020  7:43 AM EST ----- Cholesterol is stable at 222. LDL cholesterol (the bad cholesterol) is slightly hgh at 142, it should be under 130. Continue Zetia, can stay off of the cholestyramine. Fiber is also good for cholesterol so can try increasing fiber in diet or take a daily fiber supplement like fiber-con to help keep cholesterol down. Rest of labs are very good, check yearly.

## 2020-05-10 NOTE — Telephone Encounter (Signed)
I called pt and pt verbalized understanding of information below. Pt is not interested in daily fiber supplement. Pt is inquiring about a cholesterol medication that is not a statin that she "use to be on." She wants to get on that cholesterol medication again if feasible.

## 2020-05-10 NOTE — Telephone Encounter (Signed)
Dr. Caryn Section had already pulled medication in this encounter to send to pharmacy. Prescription sent into pharmacy.

## 2020-05-12 ENCOUNTER — Telehealth: Payer: Self-pay | Admitting: Family Medicine

## 2020-05-12 NOTE — Telephone Encounter (Signed)
Pt called to ask if Dr. Caryn Section will be ok with her having a lower dosed prescribed for fenofibrate 160 MG tablet   / Pt has done some research and feels more comfortable on lower dosage / she mentioned 45MG  / please advise  Pt has only taken once last night and no reaction so far

## 2020-05-13 NOTE — Telephone Encounter (Signed)
Its up to her. There is a generic 48mg  dosage if she wants to take. OK to send to her pharmacy if that's what she wants to do.

## 2020-05-14 MED ORDER — FENOFIBRATE 48 MG PO TABS: 48.0000 mg | ORAL_TABLET | Freq: Every day | ORAL | 0 refills | Status: DC

## 2020-05-14 NOTE — Telephone Encounter (Signed)
Rx for 48 mg was sent to pharmacy.

## 2020-05-19 ENCOUNTER — Telehealth: Payer: Self-pay

## 2020-05-19 NOTE — Telephone Encounter (Signed)
Copied from [place] [id number]. Topic: General - Other >> May 14, 2020 10:29 AM Yvette Rack wrote: Reason for CRM: Pt stated she went to her pharmacy to pick up her prescriptions and she was told they never received a Rx for fenofibrate 45 MG tablet. Pt requests that the Rx be sent to [facility]   I called pt and pt informed me that she got her Rx of fenofibrate 45 MG earlier. Pt states she plans to begin taking this medication tonight. I also called pharmacy beforehand to make sure Rx was picked up and Amy at [facility] informed me that the Rx had been picked up.

## 2020-06-21 NOTE — Progress Notes (Signed)
Established patient visit   Patient: Laura Love   DOB: 16-Oct-1940   80 y.o. Female  MRN: [id number] Visit Date: 06/22/2020  Today's healthcare provider: [name], MD   Chief Complaint  Patient presents with  . Back Pain   Subjective    Back Pain This is a chronic problem. The current episode started more than 1 month ago. The problem occurs constantly. The problem is unchanged. The pain is present in the lumbar spine. Quality: sharp. Radiates to: left leg. The pain is moderate. The symptoms are aggravated by bending. Associated symptoms include leg pain. Pertinent negatives include no abdominal pain, bladder incontinence, bowel incontinence, chest pain, dysuria, fever, headaches, numbness, paresis, paresthesias, pelvic pain, perianal numbness, tingling, weakness or weight loss. Risk factors: arthritis and history of back injury. Treatments tried: Accu tens unit. The treatment provided mild relief.    She does have long history chronic low back radiating to legs after accident years ago, but has been much worse since helping her husband up from a fall a few weeks ago. Pain is now also up into her mid back. She also has long history of osteoporosis and is on Reclast.   She reports a great deal of stress completing ADLs and household chores due to her back pain, which is exacerbated by her husbands declining healthy due to congestive heart failure. Neither he or she is able to properly bath Laura Love and she is having great difficulty with routine tasks such as taking out garbage and bringing in groceries. She feels she is need of assistance manageing household     Medications: Outpatient Medications Prior to Visit  Medication Sig  . acetaminophen (TYLENOL) 325 MG tablet Take 650 mg by mouth every 6 (six) hours as needed. 1 a day  . albuterol (VENTOLIN HFA) 108 (90 Base) MCG/ACT inhaler Inhale 2 puffs into the lungs every 6 (six) hours as needed for wheezing or shortness of  breath.  . Biotin 1000 MCG tablet Take 1,000 mcg by mouth daily.  . budesonide-formoterol (SYMBICORT) 160-4.5 MCG/ACT inhaler 1-2 puffs twice daily as needed  . buPROPion (WELLBUTRIN SR) 150 MG 12 hr tablet TAKE ONE TABLET TWICE DAILY  . CALCIUM PO Take 600 mg by mouth 2 (two) times daily. + D3 800 units  . dicyclomine (BENTYL) 10 MG capsule Take 1 capsule (10 mg total) by mouth 4 (four) times daily -  before meals and at bedtime. As needed for abdominal pain  . diphenhydramine-acetaminophen (TYLENOL PM) 25-500 MG TABS tablet Take 1 tablet by mouth at bedtime.  . DULoxetine (CYMBALTA) 30 MG capsule Take 1 capsule (30 mg total) by mouth daily.  Marland Kitchen ezetimibe (ZETIA) 10 MG tablet Take 1 tablet (10 mg total) by mouth daily.  . fenofibrate (TRICOR) 48 MG tablet Take 1 tablet (48 mg total) by mouth daily.  Marland Kitchen ibuprofen (ADVIL,MOTRIN) 200 MG tablet Take 200 mg by mouth every 6 (six) hours as needed. 1 tablet daily  . meclizine (ANTIVERT) 25 MG tablet Take 1 tablet (25 mg total) by mouth 3 (three) times daily as needed for dizziness.  . Melatonin 3 MG TABS Take 1 tablet by mouth at bedtime.  . sennosides-docusate sodium (SENOKOT-S) 8.6-50 MG tablet Take 2-3 tablets by mouth 2 (two) times daily. 3 tablets every morning, then 3-4 tablets in the evenings  . SYNTHROID 100 MCG tablet TAKE 1 TABLET BY MOUTH DAILY  . vitamin C (ASCORBIC ACID) 500 MG tablet Take 500 mg by  mouth daily.  . Vitamin D, Cholecalciferol, 1000 UNITS TABS Take 2 tablets by mouth daily.  . Zinc 50 MG CAPS Take by mouth.  . zinc gluconate 50 MG tablet Take 50 mg by mouth daily as needed.  . zoledronic acid (RECLAST) 5 MG/100ML SOLN injection Inject 5 mg into the vein. Once yearly starting June 2019 per Dr. Gabriel Carina   No facility-administered medications prior to visit.    Review of Systems  Constitutional: Negative for fever and weight loss.  Cardiovascular: Negative for chest pain.  Gastrointestinal: Negative for abdominal pain and  bowel incontinence.  Genitourinary: Negative for bladder incontinence, dysuria and pelvic pain.  Musculoskeletal: Positive for arthralgias and back pain.  Neurological: Negative for tingling, weakness, numbness, headaches and paresthesias.       Objective    BP (!) 141/70 (BP Location: Right Arm, Patient Position: Sitting, Cuff Size: Normal)   Pulse 79   Ht 5\' 2"  (5.638 m)   Wt 140 lb 9.6 oz (63.8 kg)   SpO2 98%   BMI 25.72 kg/m     Physical Exam   General: Appearance:     Well developed, well nourished female in no acute distress  Eyes:    PERRL, conjunctiva/corneas clear, EOM's intact       Lungs:     Clear to auscultation bilaterally, respirations unlabored  Heart:    Normal heart rate. Normal rhythm. No murmurs, rubs, or gallops.   MS:  tender over lumbar and mid lumbar spine. No gross deformities.   Neurologic:   Awake, alert, oriented x 3. No apparent focal neurological           defect.        Assessment & Plan     1. Acute midline back pain, unspecified back location  - DG Lumbar Spine Complete; Future - DG Thoracic Spine W/Swimmers; Future  2. Chronic midline low back pain with right-sided sciatica  - AMB Referral to [facility]  3. Situational stress Difficulty completing ADLs as per HPI.  - AMB Referral to [facility]       The entirety of the information documented in the History of Present Illness, Review of Systems and Physical Exam were personally obtained by me. Portions of this information were initially documented by the CMA and reviewed by me for thoroughness and accuracy.      [name], MD  [facility] [phone] (phone) [phone] (fax)  [place]

## 2020-06-22 ENCOUNTER — Ambulatory Visit (INDEPENDENT_AMBULATORY_CARE_PROVIDER_SITE_OTHER): Payer: Medicare HMO | Admitting: Family Medicine

## 2020-06-22 ENCOUNTER — Ambulatory Visit
Admission: RE | Admit: 2020-06-22 | Discharge: 2020-06-22 | Disposition: A | Discharge status: Home / Self Care | Payer: Medicare HMO | Attending: Family Medicine | Admitting: Family Medicine

## 2020-06-22 ENCOUNTER — Other Ambulatory Visit: Payer: Self-pay

## 2020-06-22 ENCOUNTER — Ambulatory Visit
Admission: RE | Admit: 2020-06-22 | Discharge: 2020-06-22 | Disposition: A | Discharge status: Home / Self Care | Payer: Medicare HMO | Source: Ambulatory Visit | Attending: Family Medicine | Admitting: Family Medicine

## 2020-06-22 ENCOUNTER — Ambulatory Visit: Payer: Medicare HMO | Admitting: Family Medicine

## 2020-06-22 VITALS — BP 141/70 | HR 79 | Ht 62.0 in | Wt 140.6 lb

## 2020-06-22 DIAGNOSIS — F439 Reaction to severe stress, unspecified: Secondary | ICD-10-CM | POA: Diagnosis not present

## 2020-06-22 DIAGNOSIS — M47814 Spondylosis without myelopathy or radiculopathy, thoracic region: Secondary | ICD-10-CM | POA: Diagnosis not present

## 2020-06-22 DIAGNOSIS — M549 Dorsalgia, unspecified: Secondary | ICD-10-CM | POA: Diagnosis not present

## 2020-06-22 DIAGNOSIS — M47816 Spondylosis without myelopathy or radiculopathy, lumbar region: Secondary | ICD-10-CM | POA: Diagnosis not present

## 2020-06-22 DIAGNOSIS — R69 Illness, unspecified: Secondary | ICD-10-CM | POA: Diagnosis not present

## 2020-06-22 DIAGNOSIS — M4184 Other forms of scoliosis, thoracic region: Secondary | ICD-10-CM | POA: Diagnosis not present

## 2020-06-22 DIAGNOSIS — M5441 Lumbago with sciatica, right side: Secondary | ICD-10-CM | POA: Diagnosis not present

## 2020-06-22 DIAGNOSIS — G8929 Other chronic pain: Secondary | ICD-10-CM

## 2020-06-22 DIAGNOSIS — S299XXA Unspecified injury of thorax, initial encounter: Secondary | ICD-10-CM | POA: Diagnosis not present

## 2020-06-22 DIAGNOSIS — M545 Low back pain, unspecified: Secondary | ICD-10-CM | POA: Diagnosis not present

## 2020-06-22 NOTE — Patient Instructions (Signed)
.   Please review the attached list of medications and notify my office if there are any errors.   . Please bring all of your medications to every appointment so we can make sure that our medication list is the same as yours.   

## 2020-06-23 ENCOUNTER — Ambulatory Visit (INDEPENDENT_AMBULATORY_CARE_PROVIDER_SITE_OTHER): Payer: Medicare HMO | Admitting: *Deleted

## 2020-06-23 ENCOUNTER — Telehealth: Payer: Self-pay | Admitting: *Deleted

## 2020-06-23 ENCOUNTER — Telehealth: Payer: Self-pay

## 2020-06-23 DIAGNOSIS — F439 Reaction to severe stress, unspecified: Secondary | ICD-10-CM

## 2020-06-23 DIAGNOSIS — E039 Hypothyroidism, unspecified: Secondary | ICD-10-CM | POA: Diagnosis not present

## 2020-06-23 DIAGNOSIS — F419 Anxiety disorder, unspecified: Secondary | ICD-10-CM

## 2020-06-23 NOTE — Telephone Encounter (Signed)
Copied from [place] [id number]. Topic: General - Other >> Jun 23, 2020  3:52 PM Loma Boston wrote: Pt was talking to [place] about Lisa Roca sorry she had to hangup,  and if he was not finished she said you can call her back if needed [phone] anytime.

## 2020-06-23 NOTE — Chronic Care Management (AMB) (Signed)
  Chronic Care Management   Note  06/23/2020 Name: Laura Love MRN: [id number] DOB: 1940/12/04  Laura Love is a 80 y.o. year old female who is a primary care patient of Caryn Section, Kirstie Peri, MD. I reached out to Angelia Mould by phone today in response to a referral sent by Ms. [name]'s PCP, Dr. Caryn Section.     [name] was given information about Chronic Care Management services today including:  1. CCM service includes personalized support from designated clinical staff supervised by her physician, including individualized plan of care and coordination with other care providers 2. 24/7 contact phone numbers for assistance for urgent and routine care needs. 3. Service will only be billed when office clinical staff spend 20 minutes or more in a month to coordinate care. 4. Only one practitioner may furnish and bill the service in a calendar month. 5. The patient may stop CCM services at any time (effective at the end of the month) by phone call to the office staff. 6. The patient will be responsible for cost sharing (co-pay) of up to 20% of the service fee (after annual deductible is met).  Patient agreed to services and verbal consent obtained.   Follow up plan: Telephone appointment with care management team member scheduled for:06/23/2020  [place] Management

## 2020-06-23 NOTE — Telephone Encounter (Signed)
   Telephone encounter was:  Successful.  06/23/2020 Name: Laura Love MRN: [id number] DOB: September 21, 1940  Laura Love is a 80 y.o. year old female who is a primary care patient of Caryn Section, Kirstie Peri, MD . The community resource team was consulted for assistance with Caregiver Stress and caregiver resources  Care guide performed the following interventions: Patient provided with information about care guide support team and interviewed to confirm resource needs Spoke with patient confirmed email and sent caregiver resources. [facility] [email or web address]-[phone], [facility]-[phone], [place], [place].  Follow Up Plan:  Care guide will follow up with patient by phone over the next 7 days  Kendry Pfarr, AAS Paralegal, [place] . Embedded Care Coordination [facility]  Care Management  [address], [place] [address] ??[email or web address]  ?? [phone]   [email or web address]

## 2020-06-24 ENCOUNTER — Telehealth: Payer: Self-pay

## 2020-06-24 DIAGNOSIS — G8929 Other chronic pain: Secondary | ICD-10-CM

## 2020-06-24 DIAGNOSIS — M25552 Pain in left hip: Secondary | ICD-10-CM

## 2020-06-24 DIAGNOSIS — M5441 Lumbago with sciatica, right side: Secondary | ICD-10-CM

## 2020-06-24 NOTE — Patient Instructions (Addendum)
Visit Information  PATIENT GOALS:  Goals Addressed            This Visit's Progress   . Find Help in My Community       Timeframe:  Long-Range Goal Priority:  Medium Start Date:    06/24/20                         Expected End Date:      12/24/20                 Follow Up Date 07/01/2020    - follow-up on any referrals for help I am given - think ahead to make sure my need does not become an emergency - have a back-up plan - make a list of family or friends that I can call    Why is this important?    Knowing how and where to find help for yourself or family in your neighborhood and community is an important skill.   You will want to take some steps to learn how.    Notes:        Laura Love was given information about Care Management services by the embedded care coordination team including:  1. Care Management services include personalized support from designated clinical staff supervised by her physician, including individualized plan of care and coordination with other care providers 2. 24/7 contact phone numbers for assistance for urgent and routine care needs. 3. The patient may stop CCM services at any time (effective at the end of the month) by phone call to the office staff.  Patient agreed to services and verbal consent obtained.   The patient verbalized understanding of instructions, educational materials, and care plan provided today and declined offer to receive copy of patient instructions, educational materials, and care plan.   Telephone follow up appointment with care management team member scheduled for: 06/30/20    Elliot Gurney, [place] Worker  [facility] Care Management [phone]

## 2020-06-24 NOTE — Telephone Encounter (Signed)
Copied from [place] [id number]. Topic: Referral - Request for Referral >> Jun 24, 2020  4:00 PM Yvette Rack wrote: Has patient seen PCP for this complaint? yes  *If NO, is insurance requiring patient see PCP for this issue before PCP can refer them? Referral for which specialty: Rehab Preferred provider/office: [facility] [place] Reason for referral: back and left hip pain

## 2020-06-24 NOTE — Chronic Care Management (AMB) (Signed)
Chronic Care Management    Clinical Social Work Note  06/24/2020 Name: Laura Love MRN: [id number] DOB: 1940-10-21  Laura Love is a 80 y.o. year old female who is a primary care patient of Fisher, Kirstie Peri, MD. The CCM team was consulted to assist the patient with chronic disease management and/or care coordination needs related to: [employer] .   Engaged with patient by telephone for initial visit in response to provider referral for social work chronic care management and care coordination services.   Consent to Services:  The patient was given the following information about Chronic Care Management services today, agreed to services, and gave verbal consent: 1. CCM service includes personalized support from designated clinical staff supervised by the primary care provider, including individualized plan of care and coordination with other care providers 2. 24/7 contact phone numbers for assistance for urgent and routine care needs. 3. Service will only be billed when office clinical staff spend 20 minutes or more in a month to coordinate care. 4. Only one practitioner may furnish and bill the service in a calendar month. 5.The patient may stop CCM services at any time (effective at the end of the month) by phone call to the office staff. 6. The patient will be responsible for cost sharing (co-pay) of up to 20% of the service fee (after annual deductible is met). Patient agreed to services and consent obtained.  Patient agreed to services and consent obtained.   Assessment: Review of patient past medical history, allergies, medications, and health status, including review of relevant consultants reports was performed today as part of a comprehensive evaluation and provision of chronic care management and care coordination services.     SDOH (Social Determinants of Health) assessments and interventions performed:    Advanced Directives Status: Not addressed in this  encounter.  CCM Care Plan  Allergies  Allergen Reactions  . Alendronate Nausea Only  . Crestor [Rosuvastatin Calcium]   . Duloxetine Nausea Only  . Pentazocine Other (See Comments)    Outpatient Encounter Medications as of 06/23/2020  Medication Sig Note  . acetaminophen (TYLENOL) 325 MG tablet Take 650 mg by mouth every 6 (six) hours as needed. 1 a day   . albuterol (VENTOLIN HFA) 108 (90 Base) MCG/ACT inhaler Inhale 2 puffs into the lungs every 6 (six) hours as needed for wheezing or shortness of breath.   . Biotin 1000 MCG tablet Take 1,000 mcg by mouth daily.   . budesonide-formoterol (SYMBICORT) 160-4.5 MCG/ACT inhaler 1-2 puffs twice daily as needed   . buPROPion (WELLBUTRIN SR) 150 MG 12 hr tablet TAKE ONE TABLET TWICE DAILY 04/20/2020: Takes once a day  . CALCIUM PO Take 600 mg by mouth 2 (two) times daily. + D3 800 units   . dicyclomine (BENTYL) 10 MG capsule Take 1 capsule (10 mg total) by mouth 4 (four) times daily -  before meals and at bedtime. As needed for abdominal pain   . diphenhydramine-acetaminophen (TYLENOL PM) 25-500 MG TABS tablet Take 1 tablet by mouth at bedtime.   . DULoxetine (CYMBALTA) 30 MG capsule Take 1 capsule (30 mg total) by mouth daily.   Marland Kitchen ezetimibe (ZETIA) 10 MG tablet Take 1 tablet (10 mg total) by mouth daily.   . fenofibrate (TRICOR) 48 MG tablet Take 1 tablet (48 mg total) by mouth daily.   Marland Kitchen ibuprofen (ADVIL,MOTRIN) 200 MG tablet Take 200 mg by mouth every 6 (six) hours as needed. 1 tablet daily   . meclizine (  ANTIVERT) 25 MG tablet Take 1 tablet (25 mg total) by mouth 3 (three) times daily as needed for dizziness.   . Melatonin 3 MG TABS Take 1 tablet by mouth at bedtime.   . sennosides-docusate sodium (SENOKOT-S) 8.6-50 MG tablet Take 2-3 tablets by mouth 2 (two) times daily. 3 tablets every morning, then 3-4 tablets in the evenings   . SYNTHROID 100 MCG tablet TAKE 1 TABLET BY MOUTH DAILY 04/20/2020: Takes 1/2 tablet daily  . vitamin C (ASCORBIC  ACID) 500 MG tablet Take 500 mg by mouth daily. 04/20/2020: occasionally  . Vitamin D, Cholecalciferol, 1000 UNITS TABS Take 2 tablets by mouth daily.   . Zinc 50 MG CAPS Take by mouth.   . zinc gluconate 50 MG tablet Take 50 mg by mouth daily as needed.   . zoledronic acid (RECLAST) 5 MG/100ML SOLN injection Inject 5 mg into the vein. Once yearly starting June 2019 per Dr. Gabriel Carina    No facility-administered encounter medications on file as of 06/23/2020.    Patient Active Problem List   Diagnosis Date Noted  . Joint pain 09/19/2019  . Tick bite 08/14/2019  . History of measles, mumps, or rubella 04/10/2017  . Allergic rhinitis 04/10/2017  . Osteoporosis 04/10/2017  . History of adenomatous polyp of colon 12/21/2014  . Hypothyroidism 11/08/2010  . Hyperlipemia 10/19/2010  . Bladder incontinence 10/19/2010  . Vaginal prolapse 10/19/2010  . Cervical pain 09/22/2009  . 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 CN (constipation) 07/02/2008  . 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 Cannot sleep 03/10/2007  . Dysthymia 03/10/2007  . Family history of breast cancer 03/10/2007    Conditions to be addressed/monitored: caregiver stress; Lacks knowledge of community resource: in home care, DME for spouse  Care Plan : General Social Work (Adult)  Updates made by Vern Claude, LCSW since 06/24/2020 12:00 AM    Problem: Caregiver Stress     Goal: Caregiver Coping Optimized   Start Date: 06/24/2020  Expected End Date: 12/24/2020  This Visit's Progress: On track  Priority: High  Note:   Current Barriers:  . Limited social support . Suicidal Ideation/Homicidal Ideation: No  Clinical Social Work Goal(s):  Marland Kitchen Over the next 90 days, patient will work with SW bi-weekly by telephone or in person to reduce or manage symptoms related to caregiver stress . patient will work with SW to address concerns related to caregiver stress and in home  support for spouse  Interventions: . Patient interviewed and appropriate assessments performed: PHQ 2 . PHQ 9 .  Patient discussed recent loss of her daughter and care giving challenges of her spouse . Patient declines referral for hospice for her spouse, however would like in home supports, [facility] services possibly and a electric wheelchair .  Patient's care giving challenges recognized and validated -verbalization of feelings encouraged . Emphasized the importance of self care and discussed considering in home care aid . Referral to care guide made who provided patient with list of in home aid services-this social worker will make the appropriate referrals as well   Patient Self Care Activities:  . Performs ADL's independently . Performs IADL's independently . Calls provider office for new concerns or questions . Ability for insight  Patient Coping Strengths:  . Hopefulness . Self Advocate . Able to Communicate Effectively  Patient Self Care Deficits:  Marland Kitchen Knowledge deficit of community resources that can assist her spouse in the home  Task: Recognize and Manage Caregiver Stress        Follow Up Plan: SW will follow up with patient by phone over the next 14 business days       [place], [place] Worker  [place] [facility] Care Management [phone]

## 2020-06-25 ENCOUNTER — Telehealth: Payer: Self-pay

## 2020-06-25 NOTE — Telephone Encounter (Signed)
Ok to place referral.

## 2020-06-25 NOTE — Telephone Encounter (Signed)
   Telephone encounter was:  Successful.  06/25/2020 Name: [name] MRN: [id number] DOB: 06-16-1940  Laura Love is a 80 y.o. year old female who is a primary care patient of Caryn Section, Kirstie Peri, MD . The community resource team was consulted for assistance with Caregiver Stress  Care guide performed the following interventions: Patient provided with information about care guide support team and interviewed to confirm resource needs Follow up call placed to the patient to discuss status of referral Spoke with patient she has received emailed information for caregiver resources. Patient plans on contacting [place]..  Follow Up Plan:  No further follow up planned at this time. The patient has been provided with needed resources.  Montine Hight, AAS Paralegal, [place] . Embedded Care Coordination [facility]  Care Management  [address], [place] [address] ??[email or web address]  ?? [phone]   [email or web address]

## 2020-06-25 NOTE — Addendum Note (Signed)
Addended by: Randal Buba on: 06/25/2020 03:19 PM   Modules accepted: Orders

## 2020-06-25 NOTE — Telephone Encounter (Signed)
I called spoke with patient advising her of message below. She declines a prescription for Tramadol.  Patient  would like a referral to [facility]. She states has a friend who goes there for physical therapy and it has benefited her friend. Please advise if appropriate and ok to refer to that location. I have an order for Physical therapy pending in this encounter. Please review and sign.

## 2020-06-25 NOTE — Telephone Encounter (Signed)
I can send in prescription for a pain med such as tramadol, or we can refer to physical therapy.

## 2020-06-26 ENCOUNTER — Other Ambulatory Visit: Payer: Self-pay | Admitting: Family Medicine

## 2020-06-28 NOTE — Telephone Encounter (Signed)
OK for referral?  

## 2020-06-28 NOTE — Addendum Note (Signed)
Addended by: Shawna Orleans on: 06/28/2020 05:31 PM   Modules accepted: Orders

## 2020-06-29 ENCOUNTER — Other Ambulatory Visit: Payer: Self-pay

## 2020-06-29 DIAGNOSIS — M25552 Pain in left hip: Secondary | ICD-10-CM

## 2020-06-29 DIAGNOSIS — G8929 Other chronic pain: Secondary | ICD-10-CM

## 2020-06-30 ENCOUNTER — Ambulatory Visit (INDEPENDENT_AMBULATORY_CARE_PROVIDER_SITE_OTHER): Payer: Medicare HMO | Admitting: *Deleted

## 2020-06-30 ENCOUNTER — Telehealth: Payer: Medicare HMO

## 2020-06-30 DIAGNOSIS — E039 Hypothyroidism, unspecified: Secondary | ICD-10-CM

## 2020-06-30 DIAGNOSIS — F419 Anxiety disorder, unspecified: Secondary | ICD-10-CM

## 2020-06-30 DIAGNOSIS — F439 Reaction to severe stress, unspecified: Secondary | ICD-10-CM

## 2020-07-01 ENCOUNTER — Ambulatory Visit: Payer: Self-pay | Admitting: *Deleted

## 2020-07-01 DIAGNOSIS — E039 Hypothyroidism, unspecified: Secondary | ICD-10-CM | POA: Diagnosis not present

## 2020-07-01 DIAGNOSIS — F341 Dysthymic disorder: Secondary | ICD-10-CM | POA: Diagnosis not present

## 2020-07-01 DIAGNOSIS — F419 Anxiety disorder, unspecified: Secondary | ICD-10-CM

## 2020-07-01 DIAGNOSIS — R69 Illness, unspecified: Secondary | ICD-10-CM | POA: Diagnosis not present

## 2020-07-01 DIAGNOSIS — F439 Reaction to severe stress, unspecified: Secondary | ICD-10-CM

## 2020-07-01 NOTE — Patient Instructions (Signed)
Visit Information  Goals Addressed            This Visit's Progress   . Find Help in My Community       Timeframe:  Long-Range Goal Priority:  Medium Start Date:    06/24/20                         Expected End Date:      12/24/20                 Follow Up Date 07/14/2020    - follow-up on any referrals for help I am given-in home care - think ahead to make sure my need does not become an emergency - have a back-up plan - make a list of family or friends that I can call    Why is this important?    Knowing how and where to find help for yourself or family in your neighborhood and community is an important skill.   You will want to take some steps to learn how.    Notes:        The patient verbalized understanding of instructions, educational materials, and care plan provided today and declined offer to receive copy of patient instructions, educational materials, and care plan.   Telephone follow up appointment with care management team member scheduled for: 07/14/20   Laura Love, [place] Worker  [facility] Care Management [phone]

## 2020-07-01 NOTE — Chronic Care Management (AMB) (Signed)
Chronic Care Management    Clinical Social Work Note  07/01/2020 Name: Laura Love MRN: [id number] DOB: 18-Apr-1940  Laura Love is a 80 y.o. year old female who is a primary care patient of Fisher, Kirstie Peri, MD. The CCM team was consulted to assist the patient with chronic disease management and/or care coordination needs related to: [employer] .   Engaged with patient by telephone for follow up visit in response to provider referral for social work chronic care management and care coordination services.   Consent to Services:  The patient was given information about Chronic Care Management services, agreed to services, and gave verbal consent prior to initiation of services.  Please see initial visit note for detailed documentation.   Patient agreed to services and consent obtained.   Assessment: Review of patient past medical history, allergies, medications, and health status, including review of relevant consultants reports was performed today as part of a comprehensive evaluation and provision of chronic care management and care coordination services.     SDOH (Social Determinants of Health) assessments and interventions performed:    Advanced Directives Status: Not addressed in this encounter.  CCM Care Plan  Allergies  Allergen Reactions  . Alendronate Nausea Only  . Crestor [Rosuvastatin Calcium]   . Duloxetine Nausea Only  . Pentazocine Other (See Comments)    Outpatient Encounter Medications as of 06/30/2020  Medication Sig Note  . acetaminophen (TYLENOL) 325 MG tablet Take 650 mg by mouth every 6 (six) hours as needed. 1 a day   . albuterol (VENTOLIN HFA) 108 (90 Base) MCG/ACT inhaler Inhale 2 puffs into the lungs every 6 (six) hours as needed for wheezing or shortness of breath.   . Biotin 1000 MCG tablet Take 1,000 mcg by mouth daily.   . budesonide-formoterol (SYMBICORT) 160-4.5 MCG/ACT inhaler 1-2 puffs twice daily as needed   . buPROPion (WELLBUTRIN  SR) 150 MG 12 hr tablet TAKE ONE TABLET TWICE DAILY 04/20/2020: Takes once a day  . CALCIUM PO Take 600 mg by mouth 2 (two) times daily. + D3 800 units   . dicyclomine (BENTYL) 10 MG capsule Take 1 capsule (10 mg total) by mouth 4 (four) times daily -  before meals and at bedtime. As needed for abdominal pain   . diphenhydramine-acetaminophen (TYLENOL PM) 25-500 MG TABS tablet Take 1 tablet by mouth at bedtime.   . DULoxetine (CYMBALTA) 30 MG capsule Take 1 capsule (30 mg total) by mouth daily.   Marland Kitchen ezetimibe (ZETIA) 10 MG tablet Take 1 tablet (10 mg total) by mouth daily.   . fenofibrate (TRICOR) 48 MG tablet Take 1 tablet (48 mg total) by mouth daily.   Marland Kitchen ibuprofen (ADVIL,MOTRIN) 200 MG tablet Take 200 mg by mouth every 6 (six) hours as needed. 1 tablet daily   . meclizine (ANTIVERT) 25 MG tablet Take 1 tablet (25 mg total) by mouth 3 (three) times daily as needed for dizziness.   . Melatonin 3 MG TABS Take 1 tablet by mouth at bedtime.   . sennosides-docusate sodium (SENOKOT-S) 8.6-50 MG tablet Take 2-3 tablets by mouth 2 (two) times daily. 3 tablets every morning, then 3-4 tablets in the evenings   . SYNTHROID 100 MCG tablet TAKE 1 TABLET BY MOUTH DAILY 04/20/2020: Takes 1/2 tablet daily  . vitamin C (ASCORBIC ACID) 500 MG tablet Take 500 mg by mouth daily. 04/20/2020: occasionally  . Vitamin D, Cholecalciferol, 1000 UNITS TABS Take 2 tablets by mouth daily.   Marland Kitchen Zinc  50 MG CAPS Take by mouth.   . zinc gluconate 50 MG tablet Take 50 mg by mouth daily as needed.   . zoledronic acid (RECLAST) 5 MG/100ML SOLN injection Inject 5 mg into the vein. Once yearly starting June 2019 per Dr. Gabriel Carina    No facility-administered encounter medications on file as of 06/30/2020.    Patient Active Problem List   Diagnosis Date Noted  . Joint pain 09/19/2019  . Tick bite 08/14/2019  . History of measles, mumps, or rubella 04/10/2017  . Allergic rhinitis 04/10/2017  . Osteoporosis 04/10/2017  . History of  adenomatous polyp of colon 12/21/2014  . Hypothyroidism 11/08/2010  . Hyperlipemia 10/19/2010  . Bladder incontinence 10/19/2010  . Vaginal prolapse 10/19/2010  . Cervical pain 09/22/2009  . 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 CN (constipation) 07/02/2008  . 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 Cannot sleep 03/10/2007  . Dysthymia 03/10/2007  . Family history of breast cancer 03/10/2007    Conditions to be addressed/monitored: Anxiety and caregiver stress; Lacks knowledge of community resource: relatd to in home [place] : General Social Work (Adult)  Updates made by Vern Claude, LCSW since 07/01/2020 12:00 AM    Problem: Caregiver Stress     Goal: Caregiver Coping Optimized   Start Date: 06/24/2020  Expected End Date: 12/24/2020  Recent Progress: On track  Priority: High  Note:   Current Barriers:  . Limited social support . Suicidal Ideation/Homicidal Ideation: No  Clinical Social Work Goal(s):  Marland Kitchen Over the next 90 days, patient will work with SW bi-weekly by telephone or in person to reduce or manage symptoms related to caregiver stress . patient will work with SW to address concerns related to caregiver stress and in home support for spouse  Interventions: . Patient continues to decline referral for hospice or long term care for her spouse, however would like in home supports, [facility] services possibly and a electric wheelchair . Collaboration phone call confirmed with patient for in home caregiver, they will contact patient today to discuss in home care services for her spouse . Patient interested in [facility] PT for patient as well as an IT trainer wheelchair.  . Message sent to patient's provider to discuss recommendations, justification requirements discussed and obtained .  Patient's care giving challenges recognized and validated -verbalization of feelings encouraged . Emphasized the importance of self care and  discussed considering in home care aid . Confirmed follow up with needed DME once recommendation received from patient's provider   Patient Self Care Activities:  . Performs ADL's independently . Performs IADL's independently . Calls provider office for new concerns or questions . Ability for insight  Patient Coping Strengths:  . Hopefulness . Self Advocate . Able to Communicate Effectively  Patient Self Care Deficits:  Marland Kitchen Knowledge deficit of community resources that can assist her spouse in the home            Follow Up Plan: SW will follow up with patient by phone over the next 14 buisness days       Elliot Gurney, [place] Worker  [facility] [phone]

## 2020-07-02 NOTE — Patient Instructions (Signed)
Visit Information  Goals Addressed            This Visit's Progress   . Find Help in My Community       Timeframe:  Long-Range Goal Priority:  Medium Start Date:    06/24/20                         Expected End Date:      12/24/20                 Follow Up Date 07/14/2020    - follow-up on any referrals for help I am given-in home care, DME, HH - think ahead to make sure my need does not become an emergency - have a back-up plan - make a list of family or friends that I can call    Why is this important?    Knowing how and where to find help for yourself or family in your neighborhood and community is an important skill.   You will want to take some steps to learn how.    Notes:        The patient verbalized understanding of instructions, educational materials, and care plan provided today and declined offer to receive copy of patient instructions, educational materials, and care plan.   Telephone follow up appointment with care management team member scheduled for:07/14/20   Elliot Gurney, [place] Worker  [facility] [phone]

## 2020-07-02 NOTE — Chronic Care Management (AMB) (Signed)
Chronic Care Management    Clinical Social Work Note  07/02/2020 Name: Laura Love MRN: [id number] DOB: [birth year]  Laura Love is a 80 y.o. year old female who is a primary care patient of Fisher, Kirstie Peri, MD. The CCM team was consulted to assist the patient with chronic disease management and/or care coordination needs related to: [employer] .   Engaged with patient by telephone for follow up visit in response to provider referral for social work chronic care management and care coordination services.   Consent to Services:  The patient was given information about Chronic Care Management services, agreed to services, and gave verbal consent prior to initiation of services.  Please see initial visit note for detailed documentation.   Patient agreed to services and consent obtained.   Assessment: Review of patient past medical history, allergies, medications, and health status, including review of relevant consultants reports was performed today as part of a comprehensive evaluation and provision of chronic care management and care coordination services.     SDOH (Social Determinants of Health) assessments and interventions performed:    Advanced Directives Status: Not addressed in this encounter.  CCM Care Plan  Allergies  Allergen Reactions  . Alendronate Nausea Only  . Crestor [Rosuvastatin Calcium]   . Duloxetine Nausea Only  . Pentazocine Other (See Comments)    Outpatient Encounter Medications as of 07/01/2020  Medication Sig Note  . acetaminophen (TYLENOL) 325 MG tablet Take 650 mg by mouth every 6 (six) hours as needed. 1 a day   . albuterol (VENTOLIN HFA) 108 (90 Base) MCG/ACT inhaler Inhale 2 puffs into the lungs every 6 (six) hours as needed for wheezing or shortness of breath.   . Biotin 1000 MCG tablet Take 1,000 mcg by mouth daily.   . budesonide-formoterol (SYMBICORT) 160-4.5 MCG/ACT inhaler 1-2 puffs twice daily as needed   . buPROPion (WELLBUTRIN  SR) 150 MG 12 hr tablet TAKE ONE TABLET TWICE DAILY 04/20/2020: Takes once a day  . CALCIUM PO Take 600 mg by mouth 2 (two) times daily. + D3 800 units   . dicyclomine (BENTYL) 10 MG capsule Take 1 capsule (10 mg total) by mouth 4 (four) times daily -  before meals and at bedtime. As needed for abdominal pain   . diphenhydramine-acetaminophen (TYLENOL PM) 25-500 MG TABS tablet Take 1 tablet by mouth at bedtime.   . DULoxetine (CYMBALTA) 30 MG capsule Take 1 capsule (30 mg total) by mouth daily.   Marland Kitchen ezetimibe (ZETIA) 10 MG tablet Take 1 tablet (10 mg total) by mouth daily.   . fenofibrate (TRICOR) 48 MG tablet Take 1 tablet (48 mg total) by mouth daily.   Marland Kitchen ibuprofen (ADVIL,MOTRIN) 200 MG tablet Take 200 mg by mouth every 6 (six) hours as needed. 1 tablet daily   . meclizine (ANTIVERT) 25 MG tablet Take 1 tablet (25 mg total) by mouth 3 (three) times daily as needed for dizziness.   . Melatonin 3 MG TABS Take 1 tablet by mouth at bedtime.   . sennosides-docusate sodium (SENOKOT-S) 8.6-50 MG tablet Take 2-3 tablets by mouth 2 (two) times daily. 3 tablets every morning, then 3-4 tablets in the evenings   . SYNTHROID 100 MCG tablet TAKE 1 TABLET BY MOUTH DAILY 04/20/2020: Takes 1/2 tablet daily  . vitamin C (ASCORBIC ACID) 500 MG tablet Take 500 mg by mouth daily. 04/20/2020: occasionally  . Vitamin D, Cholecalciferol, 1000 UNITS TABS Take 2 tablets by mouth daily.   Marland Kitchen Zinc  50 MG CAPS Take by mouth.   . zinc gluconate 50 MG tablet Take 50 mg by mouth daily as needed.   . zoledronic acid (RECLAST) 5 MG/100ML SOLN injection Inject 5 mg into the vein. Once yearly starting June 2019 per Dr. Gabriel Carina    No facility-administered encounter medications on file as of 07/01/2020.    Patient Active Problem List   Diagnosis Date Noted  . Joint pain 09/19/2019  . Tick bite 08/14/2019  . History of measles, mumps, or rubella 04/10/2017  . Allergic rhinitis 04/10/2017  . Osteoporosis 04/10/2017  . History of  adenomatous polyp of colon 12/21/2014  . Hypothyroidism 11/08/2010  . Hyperlipemia 10/19/2010  . Bladder incontinence 10/19/2010  . Vaginal prolapse 10/19/2010  . Cervical pain 09/22/2009  . 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 CN (constipation) 07/02/2008  . 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 Cannot sleep 03/10/2007  . Dysthymia 03/10/2007  . Family history of breast cancer 03/10/2007    Conditions to be addressed/monitored:  Level of care concerns and Lacks knowledge of community resource: in home care DME  Care Plan : General Social Work (Adult)  Updates made by Vern Claude, LCSW since 07/02/2020 12:00 AM    Problem: Caregiver Stress     Goal: Caregiver Coping Optimized   Start Date: 06/24/2020  Expected End Date: 12/24/2020  Recent Progress: On track  Priority: High  Note:   Current Barriers:  . Limited social support . Suicidal Ideation/Homicidal Ideation: No  Clinical Social Work Goal(s):  Marland Kitchen Over the next 90 days, patient will work with SW bi-weekly by telephone or in person to reduce or manage symptoms related to caregiver stress . patient will work with SW to address concerns related to caregiver stress and in home support for spouse  Interventions: . Patient continues to decline referral for hospice or long term care for her spouse, however would like in home supports- [facility] PT possibly and a electric wheelchair . Phone call confirmed with patient for in home caregiver, services discussed however she has decided to hold off at this time until spouse's condition progresses to needing at least 5 hours per day . Message sent to patient's provider to discuss recommendations for [facility] and , justification requirements for electric wheelchair-response pending . Patient would also like to be referred to PT for back pain-Contact information provided for [facility] for follow up .  Patient's care  giving challenges continue to be recognized and validated -verbalization of feelings encouraged . Importance of self care emphasized . Confirmed follow up with needed DME once recommendation received from patient's provider   Patient Self Care Activities:  . Performs ADL's independently . Performs IADL's independently . Calls provider office for new concerns or questions . Ability for insight  Patient Coping Strengths:  . Hopefulness . Self Advocate . Able to Communicate Effectively  Patient Self Care Deficits:  Marland Kitchen Knowledge deficit of community resources that can assist her spouse in the home            Follow Up Plan: SW will follow up with patient by phone over the next 7-14 business days       [place], [place] Worker  [facility] [phone]

## 2020-07-06 ENCOUNTER — Ambulatory Visit
Admission: RE | Admit: 2020-07-06 | Discharge: 2020-07-06 | Disposition: A | Discharge status: Home / Self Care | Payer: Medicare HMO | Source: Ambulatory Visit | Attending: Family Medicine | Admitting: Family Medicine

## 2020-07-06 ENCOUNTER — Other Ambulatory Visit: Payer: Self-pay

## 2020-07-06 DIAGNOSIS — Z1231 Encounter for screening mammogram for malignant neoplasm of breast: Secondary | ICD-10-CM | POA: Diagnosis not present

## 2020-07-08 ENCOUNTER — Ambulatory Visit: Payer: Medicare HMO | Admitting: Physical Therapy

## 2020-07-08 ENCOUNTER — Ambulatory Visit: Payer: Self-pay | Admitting: *Deleted

## 2020-07-08 DIAGNOSIS — F439 Reaction to severe stress, unspecified: Secondary | ICD-10-CM

## 2020-07-08 DIAGNOSIS — E039 Hypothyroidism, unspecified: Secondary | ICD-10-CM

## 2020-07-08 DIAGNOSIS — F419 Anxiety disorder, unspecified: Secondary | ICD-10-CM

## 2020-07-08 NOTE — Chronic Care Management (AMB) (Signed)
Chronic Care Management    Clinical Social Work Note  07/08/2020 Name: Laura Love MRN: [id number] DOB: Sep 11, 1940  TALEEYA Love is a 80 y.o. year old female who is a primary care patient of Fisher, Kirstie Peri, MD. The CCM team was consulted to assist the patient with chronic disease management and/or care coordination needs related to: [employer]  and [place] [employer].   Engaged with patient by telephone for follow up visit in response to provider referral for social work chronic care management and care coordination services.   Consent to Services:  The patient was given information about Chronic Care Management services, agreed to services, and gave verbal consent prior to initiation of services.  Please see initial visit note for detailed documentation.   Patient agreed to services and consent obtained.   Assessment: Review of patient past medical history, allergies, medications, and health status, including review of relevant consultants reports was performed today as part of a comprehensive evaluation and provision of chronic care management and care coordination services.     SDOH (Social Determinants of Health) assessments and interventions performed:    Advanced Directives Status: Not addressed in this encounter.  CCM Care Plan  Allergies  Allergen Reactions  . Alendronate Nausea Only  . Crestor [Rosuvastatin Calcium]   . Duloxetine Nausea Only  . Pentazocine Other (See Comments)    Outpatient Encounter Medications as of 07/08/2020  Medication Sig Note  . acetaminophen (TYLENOL) 325 MG tablet Take 650 mg by mouth every 6 (six) hours as needed. 1 a day   . albuterol (VENTOLIN HFA) 108 (90 Base) MCG/ACT inhaler Inhale 2 puffs into the lungs every 6 (six) hours as needed for wheezing or shortness of breath.   . Biotin 1000 MCG tablet Take 1,000 mcg by mouth daily.   . budesonide-formoterol (SYMBICORT) 160-4.5 MCG/ACT inhaler 1-2 puffs twice  daily as needed   . buPROPion (WELLBUTRIN SR) 150 MG 12 hr tablet TAKE ONE TABLET TWICE DAILY 04/20/2020: Takes once a day  . CALCIUM PO Take 600 mg by mouth 2 (two) times daily. + D3 800 units   . dicyclomine (BENTYL) 10 MG capsule Take 1 capsule (10 mg total) by mouth 4 (four) times daily -  before meals and at bedtime. As needed for abdominal pain   . diphenhydramine-acetaminophen (TYLENOL PM) 25-500 MG TABS tablet Take 1 tablet by mouth at bedtime.   . DULoxetine (CYMBALTA) 30 MG capsule Take 1 capsule (30 mg total) by mouth daily.   Marland Kitchen ezetimibe (ZETIA) 10 MG tablet Take 1 tablet (10 mg total) by mouth daily.   . fenofibrate (TRICOR) 48 MG tablet Take 1 tablet (48 mg total) by mouth daily.   Marland Kitchen ibuprofen (ADVIL,MOTRIN) 200 MG tablet Take 200 mg by mouth every 6 (six) hours as needed. 1 tablet daily   . meclizine (ANTIVERT) 25 MG tablet Take 1 tablet (25 mg total) by mouth 3 (three) times daily as needed for dizziness.   . Melatonin 3 MG TABS Take 1 tablet by mouth at bedtime.   . sennosides-docusate sodium (SENOKOT-S) 8.6-50 MG tablet Take 2-3 tablets by mouth 2 (two) times daily. 3 tablets every morning, then 3-4 tablets in the evenings   . SYNTHROID 100 MCG tablet TAKE 1 TABLET BY MOUTH DAILY 04/20/2020: Takes 1/2 tablet daily  . vitamin C (ASCORBIC ACID) 500 MG tablet Take 500 mg by mouth daily. 04/20/2020: occasionally  . Vitamin D, Cholecalciferol, 1000 UNITS TABS Take 2 tablets by  mouth daily.   . Zinc 50 MG CAPS Take by mouth.   . zinc gluconate 50 MG tablet Take 50 mg by mouth daily as needed.   . zoledronic acid (RECLAST) 5 MG/100ML SOLN injection Inject 5 mg into the vein. Once yearly starting June 2019 per Dr. Gabriel Carina    No facility-administered encounter medications on file as of 07/08/2020.    Patient Active Problem List   Diagnosis Date Noted  . Joint pain 09/19/2019  . Tick bite 08/14/2019  . History of measles, mumps, or rubella 04/10/2017  . Allergic rhinitis 04/10/2017  .  Osteoporosis 04/10/2017  . History of adenomatous polyp of colon 12/21/2014  . Hypothyroidism 11/08/2010  . Hyperlipemia 10/19/2010  . Bladder incontinence 10/19/2010  . Vaginal prolapse 10/19/2010  . Cervical pain 09/22/2009  . 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 CN (constipation) 07/02/2008  . 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 Cannot sleep 03/10/2007  . Dysthymia 03/10/2007  . Family history of breast cancer 03/10/2007    Conditions to be addressed/monitored: caregiver stress;   Care Plan : General Social Work (Adult)  Updates made by Vern Claude, LCSW since 07/08/2020 12:00 AM    Problem: Caregiver Stress     Goal: Caregiver Coping Optimized   Start Date: 06/24/2020  Expected End Date: 12/24/2020  This Visit's Progress: On track  Recent Progress: On track  Priority: High  Note:   Current Barriers:  . Limited social support . Suicidal Ideation/Homicidal Ideation: No  Clinical Social Work Goal(s):  Marland Kitchen Over the next 90 days, patient will work with SW bi-weekly by telephone or in person to reduce or manage symptoms related to caregiver stress . patient will work with SW to address concerns related to caregiver stress and in home support for spouse  Interventions: . Patient continues to discuss caregiver stress and spouse's progressing medical condition and increased need for care-frequent falls discussed . Patient now willing to consider in home hospice care and has contacted them to start services . Emotional support and re-assurance provided-patient has discussed plan to follow up with grief counselor through hospice and palliative care. . Patient recently referred for PT for back pain-appointment scheduled for 07/12/20 .  Patient's care giving challenges continue to be recognized and validated-verbalization of feelings encouraged . Importance of self care emphasized  Patient Self Care Activities:   . Performs ADL's independently . Performs IADL's independently . Calls provider office for new concerns or questions . Ability for insight  Patient Coping Strengths:  . Hopefulness . Self Advocate . Able to Communicate Effectively  Patient Self Care Deficits:  Marland Kitchen Knowledge deficit of community resources that can assist her spouse in the home            Follow Up Plan: SW will follow up with patient by phone over the next 7-14 business days       [place], [place] Worker  [facility] [phone]

## 2020-07-08 NOTE — Patient Instructions (Signed)
Visit Information  Goals Addressed            This Visit's Progress   . Find Help in My Community       Timeframe:  Long-Range Goal Priority:  Medium Start Date:    06/24/20                         Expected End Date:      12/24/20                 Follow Up Date 07/14/2020    - follow-up on any referrals for help I am given-hospice care - think ahead to make sure my need does not become an emergency - have a back-up plan - make a list of family or friends that I can call    Why is this important?    Knowing how and where to find help for yourself or family in your neighborhood and community is an important skill.   You will want to take some steps to learn how.    Notes:        The patient verbalized understanding of instructions, educational materials, and care plan provided today and declined offer to receive copy of patient instructions, educational materials, and care plan.   Telephone follow up appointment with care management team member scheduled for:07/14/20   Elliot Gurney, [place] Worker  [facility] [phone]

## 2020-07-12 ENCOUNTER — Encounter: Payer: Medicare HMO | Admitting: Physical Therapy

## 2020-07-12 ENCOUNTER — Ambulatory Visit: Payer: Medicare HMO | Admitting: Physical Therapy

## 2020-07-13 ENCOUNTER — Other Ambulatory Visit: Payer: Self-pay | Admitting: Family Medicine

## 2020-07-13 DIAGNOSIS — R059 Cough, unspecified: Secondary | ICD-10-CM

## 2020-07-13 DIAGNOSIS — R062 Wheezing: Secondary | ICD-10-CM

## 2020-07-14 ENCOUNTER — Ambulatory Visit: Payer: Self-pay | Admitting: *Deleted

## 2020-07-14 DIAGNOSIS — F341 Dysthymic disorder: Secondary | ICD-10-CM

## 2020-07-14 DIAGNOSIS — F439 Reaction to severe stress, unspecified: Secondary | ICD-10-CM

## 2020-07-14 DIAGNOSIS — F419 Anxiety disorder, unspecified: Secondary | ICD-10-CM

## 2020-07-14 NOTE — Patient Instructions (Signed)
Visit Information  Goals Addressed            This Visit's Progress   . Find Help in My Community       Timeframe:  Long-Range Goal Priority:  Medium Start Date:    06/24/20                         Expected End Date:      12/24/20                 Follow Up Date 07/21/2020    - follow-up on any referrals for help I am given-hospice care - think ahead to make sure my need does not become an emergency - have a back-up plan - make a list of family or friends that I can call    Why is this important?    Knowing how and where to find help for yourself or family in your neighborhood and community is an important skill.   You will want to take some steps to learn how.    Notes:        The patient verbalized understanding of instructions, educational materials, and care plan provided today and declined offer to receive copy of patient instructions, educational materials, and care plan.   Telephone follow up appointment with care management team member scheduled for:  07/21/20    Elliot Gurney, [place] Worker  [place] [facility] [phone]

## 2020-07-14 NOTE — Chronic Care Management (AMB) (Signed)
Chronic Care Management    Clinical Social Work Note  07/14/2020 Name: Laura Love MRN: [id number] DOB: [birth year]  Laura Love is a 80 y.o. year old female who is a primary care patient of Fisher, Kirstie Peri, MD. The CCM team was consulted to assist the patient with chronic disease management and/or care coordination needs related to: [employer] .   Engaged with patient by telephone for follow up visit in response to provider referral for social work chronic care management and care coordination services.   Consent to Services:  The patient was given information about Chronic Care Management services, agreed to services, and gave verbal consent prior to initiation of services.  Please see initial visit note for detailed documentation.   Patient agreed to services and consent obtained.   Assessment: Review of patient past medical history, allergies, medications, and health status, including review of relevant consultants reports was performed today as part of a comprehensive evaluation and provision of chronic care management and care coordination services.     SDOH (Social Determinants of Health) assessments and interventions performed:    Advanced Directives Status: Not addressed in this encounter.  CCM Care Plan  Allergies  Allergen Reactions  . Alendronate Nausea Only  . Crestor [Rosuvastatin Calcium]   . Duloxetine Nausea Only  . Pentazocine Other (See Comments)    Outpatient Encounter Medications as of 07/14/2020  Medication Sig Note  . acetaminophen (TYLENOL) 325 MG tablet Take 650 mg by mouth every 6 (six) hours as needed. 1 a day   . Biotin 1000 MCG tablet Take 1,000 mcg by mouth daily.   . budesonide-formoterol (SYMBICORT) 160-4.5 MCG/ACT inhaler 1-2 puffs twice daily as needed   . buPROPion (WELLBUTRIN SR) 150 MG 12 hr tablet TAKE ONE TABLET TWICE DAILY 04/20/2020: Takes once a day  . CALCIUM PO Take 600 mg by mouth 2 (two) times daily. + D3 800 units   .  dicyclomine (BENTYL) 10 MG capsule Take 1 capsule (10 mg total) by mouth 4 (four) times daily -  before meals and at bedtime. As needed for abdominal pain   . diphenhydramine-acetaminophen (TYLENOL PM) 25-500 MG TABS tablet Take 1 tablet by mouth at bedtime.   . DULoxetine (CYMBALTA) 30 MG capsule Take 1 capsule (30 mg total) by mouth daily.   Marland Kitchen ezetimibe (ZETIA) 10 MG tablet Take 1 tablet (10 mg total) by mouth daily.   . fenofibrate (TRICOR) 48 MG tablet Take 1 tablet (48 mg total) by mouth daily.   Marland Kitchen ibuprofen (ADVIL,MOTRIN) 200 MG tablet Take 200 mg by mouth every 6 (six) hours as needed. 1 tablet daily   . meclizine (ANTIVERT) 25 MG tablet Take 1 tablet (25 mg total) by mouth 3 (three) times daily as needed for dizziness.   . Melatonin 3 MG TABS Take 1 tablet by mouth at bedtime.   . sennosides-docusate sodium (SENOKOT-S) 8.6-50 MG tablet Take 2-3 tablets by mouth 2 (two) times daily. 3 tablets every morning, then 3-4 tablets in the evenings   . SYNTHROID 100 MCG tablet TAKE 1 TABLET BY MOUTH DAILY 04/20/2020: Takes 1/2 tablet daily  . VENTOLIN HFA 108 (90 Base) MCG/ACT inhaler INHALE 2 PUFFS INTO THE LUNGS EVERY SIX HOURS AS NEEDED FOR WHEEZING OR SHORTNESS OF BREATH   . vitamin C (ASCORBIC ACID) 500 MG tablet Take 500 mg by mouth daily. 04/20/2020: occasionally  . Vitamin D, Cholecalciferol, 1000 UNITS TABS Take 2 tablets by mouth daily.   . Zinc 50 MG  CAPS Take by mouth.   . zinc gluconate 50 MG tablet Take 50 mg by mouth daily as needed.   . zoledronic acid (RECLAST) 5 MG/100ML SOLN injection Inject 5 mg into the vein. Once yearly starting June 2019 per Dr. Gabriel Carina    No facility-administered encounter medications on file as of 07/14/2020.    Patient Active Problem List   Diagnosis Date Noted  . Joint pain 09/19/2019  . Tick bite 08/14/2019  . History of measles, mumps, or rubella 04/10/2017  . Allergic rhinitis 04/10/2017  . Osteoporosis 04/10/2017  . History of adenomatous polyp of  colon 12/21/2014  . Hypothyroidism 11/08/2010  . Hyperlipemia 10/19/2010  . Bladder incontinence 10/19/2010  . Vaginal prolapse 10/19/2010  . Cervical pain 09/22/2009  . 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 CN (constipation) 07/02/2008  . 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 Cannot sleep 03/10/2007  . Dysthymia 03/10/2007  . Family history of breast cancer 03/10/2007    Conditions to be addressed/monitored: Caregiver stress; Lacks knowledge of community resource: in home assistance for spouse  Care Plan : General Social Work (Adult)  Updates made by Vern Claude, LCSW since 07/14/2020 12:00 AM    Problem: Caregiver Stress     Goal: Caregiver Coping Optimized   Start Date: 06/24/2020  Expected End Date: 12/24/2020  Recent Progress: On track  Priority: High  Note:   Current Barriers:  . Limited social support . Suicidal Ideation/Homicidal Ideation: No  Clinical Social Work Goal(s):  Marland Kitchen Over the next 90 days, patient will work with SW bi-weekly by telephone or in person to reduce or manage symptoms related to caregiver stress . patient will work with SW to address concerns related to caregiver stress and in home support for spouse  Interventions: . Patient continues to discuss caregiver stress and spouse's progressing medical condition and increased need for care-frequent falls discussed . Patient now willing to consider in home hospice care for her spouse however he has declined this service due to conflicts during intake assessment . Patient would like to reconsider in home hospice at this time due to spouse's current hospitalization . Emotional support and re-assurance continue to be provided-patient has discussed plan to follow up with grief counselor through hospice and palliative care. .  Patient's care giving challenges continue to be recognized and validated-verbalization of feelings encouraged . Importance of  self care continues to be emphasized  Patient Self Care Activities:  . Performs ADL's independently . Performs IADL's independently . Calls provider office for new concerns or questions . Ability for insight  Patient Coping Strengths:  . Hopefulness . Self Advocate . Able to Communicate Effectively  Patient Self Care Deficits:  Marland Kitchen Knowledge deficit of community resources that can assist her spouse in the home            Follow Up Plan: SW will follow up with patient by phone over the next 7-14 business days      [place], [place] Worker  [facility] [phone]

## 2020-07-15 ENCOUNTER — Ambulatory Visit: Payer: Medicare HMO | Admitting: Physical Therapy

## 2020-07-19 ENCOUNTER — Ambulatory Visit: Payer: Medicare HMO | Admitting: Physical Therapy

## 2020-07-21 ENCOUNTER — Ambulatory Visit: Payer: Self-pay | Admitting: *Deleted

## 2020-07-21 DIAGNOSIS — F341 Dysthymic disorder: Secondary | ICD-10-CM | POA: Diagnosis not present

## 2020-07-21 DIAGNOSIS — E039 Hypothyroidism, unspecified: Secondary | ICD-10-CM

## 2020-07-21 DIAGNOSIS — F419 Anxiety disorder, unspecified: Secondary | ICD-10-CM

## 2020-07-21 DIAGNOSIS — F439 Reaction to severe stress, unspecified: Secondary | ICD-10-CM

## 2020-07-21 DIAGNOSIS — R69 Illness, unspecified: Secondary | ICD-10-CM | POA: Diagnosis not present

## 2020-07-21 NOTE — Chronic Care Management (AMB) (Signed)
Chronic Care Management    Clinical Social Work Note  07/21/2020 Name: Laura Love MRN: [id number] DOB: November 17, 1940  Laura Love is a 80 y.o. year old female who is a primary care patient of Fisher, Kirstie Peri, MD. The CCM team was consulted to assist the patient with chronic disease management and/or care coordination needs related to: [employer] .   Engaged with patient by telephone for follow up visit in response to provider referral for social work chronic care management and care coordination services.   Consent to Services:  The patient was given information about Chronic Care Management services, agreed to services, and gave verbal consent prior to initiation of services.  Please see initial visit note for detailed documentation.   Patient agreed to services and consent obtained.   Assessment: Review of patient past medical history, allergies, medications, and health status, including review of relevant consultants reports was performed today as part of a comprehensive evaluation and provision of chronic care management and care coordination services.     SDOH (Social Determinants of Health) assessments and interventions performed:    Advanced Directives Status: Not addressed in this encounter.  CCM Care Plan  Allergies  Allergen Reactions  . Alendronate Nausea Only  . Crestor [Rosuvastatin Calcium]   . Duloxetine Nausea Only  . Pentazocine Other (See Comments)    Outpatient Encounter Medications as of 07/21/2020  Medication Sig Note  . acetaminophen (TYLENOL) 325 MG tablet Take 650 mg by mouth every 6 (six) hours as needed. 1 a day   . Biotin 1000 MCG tablet Take 1,000 mcg by mouth daily.   . budesonide-formoterol (SYMBICORT) 160-4.5 MCG/ACT inhaler 1-2 puffs twice daily as needed   . buPROPion (WELLBUTRIN SR) 150 MG 12 hr tablet TAKE ONE TABLET TWICE DAILY 04/20/2020: Takes once a day  . CALCIUM PO Take 600 mg by mouth 2 (two) times daily. + D3 800 units   .  dicyclomine (BENTYL) 10 MG capsule Take 1 capsule (10 mg total) by mouth 4 (four) times daily -  before meals and at bedtime. As needed for abdominal pain   . diphenhydramine-acetaminophen (TYLENOL PM) 25-500 MG TABS tablet Take 1 tablet by mouth at bedtime.   . DULoxetine (CYMBALTA) 30 MG capsule Take 1 capsule (30 mg total) by mouth daily.   Marland Kitchen ezetimibe (ZETIA) 10 MG tablet Take 1 tablet (10 mg total) by mouth daily.   . fenofibrate (TRICOR) 48 MG tablet Take 1 tablet (48 mg total) by mouth daily.   Marland Kitchen ibuprofen (ADVIL,MOTRIN) 200 MG tablet Take 200 mg by mouth every 6 (six) hours as needed. 1 tablet daily   . meclizine (ANTIVERT) 25 MG tablet Take 1 tablet (25 mg total) by mouth 3 (three) times daily as needed for dizziness.   . Melatonin 3 MG TABS Take 1 tablet by mouth at bedtime.   . sennosides-docusate sodium (SENOKOT-S) 8.6-50 MG tablet Take 2-3 tablets by mouth 2 (two) times daily. 3 tablets every morning, then 3-4 tablets in the evenings   . SYNTHROID 100 MCG tablet TAKE 1 TABLET BY MOUTH DAILY 04/20/2020: Takes 1/2 tablet daily  . VENTOLIN HFA 108 (90 Base) MCG/ACT inhaler INHALE 2 PUFFS INTO THE LUNGS EVERY SIX HOURS AS NEEDED FOR WHEEZING OR SHORTNESS OF BREATH   . vitamin C (ASCORBIC ACID) 500 MG tablet Take 500 mg by mouth daily. 04/20/2020: occasionally  . Vitamin D, Cholecalciferol, 1000 UNITS TABS Take 2 tablets by mouth daily.   . Zinc 50 MG  CAPS Take by mouth.   . zinc gluconate 50 MG tablet Take 50 mg by mouth daily as needed.   . zoledronic acid (RECLAST) 5 MG/100ML SOLN injection Inject 5 mg into the vein. Once yearly starting June 2019 per Dr. Gabriel Carina    No facility-administered encounter medications on file as of 07/21/2020.    Patient Active Problem List   Diagnosis Date Noted  . Joint pain 09/19/2019  . Tick bite 08/14/2019  . History of measles, mumps, or rubella 04/10/2017  . Allergic rhinitis 04/10/2017  . Osteoporosis 04/10/2017  . History of adenomatous polyp of  colon 12/21/2014  . Hypothyroidism 11/08/2010  . Hyperlipemia 10/19/2010  . Bladder incontinence 10/19/2010  . Vaginal prolapse 10/19/2010  . Cervical pain 09/22/2009  . 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 CN (constipation) 07/02/2008  . 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 Cannot sleep 03/10/2007  . Dysthymia 03/10/2007  . Family history of breast cancer 03/10/2007    Conditions to be addressed/monitored: caregiver stress;   Care Plan : General Social Work (Adult)  Updates made by Vern Claude, LCSW since 07/21/2020 12:00 AM    Problem: Caregiver Stress     Goal: Caregiver Coping Optimized   Start Date: 06/24/2020  Expected End Date: 12/24/2020  Recent Progress: On track  Priority: High  Note:   Current Barriers:  . Limited social support . Suicidal Ideation/Homicidal Ideation: No  Clinical Social Work Goal(s):  Marland Kitchen Over the next 90 days, patient will work with SW bi-weekly by telephone or in person to reduce or manage symptoms related to caregiver stress . patient will work with SW to address concerns related to caregiver stress and in home support for spouse  Interventions: . Patient confirmed that her spouse passed away last week, hospice care was just getting started when he passed away . Verbalization of feelings encouraged, emotional support and reassurance provided . Patient confirmed continued follow up with grief counseling through Hospice and Palliative Care . Importance of self care continues to be emphasized  Patient Self Care Activities:  . Performs ADL's independently . Performs IADL's independently . Calls provider office for new concerns or questions . Ability for insight  Patient Coping Strengths:  . Hopefulness . Self Advocate . Able to Communicate Effectively  Patient Self Care Deficits:  Marland Kitchen Knowledge deficit of community resources that can assist her spouse in the home             Follow Up Plan: SW will follow up with patient by phone over the next 14-30 days        [place], [place] Worker  [facility] [phone]

## 2020-07-21 NOTE — Patient Instructions (Signed)
Visit Information  Goals Addressed            This Visit's Progress   . Find Help in My Community       Timeframe:  Long-Range Goal Priority:  Medium Start Date:    06/24/20                         Expected End Date:      12/24/20                 Follow Up Date 07/21/2020    - follow-up on any referrals for help I am given-grief counseling through Hospice and Palliative Care - think ahead to make sure my need does not become an emergency - have a back-up plan - make a list of family or friends that I can call    Why is this important?    Knowing how and where to find help for yourself or family in your neighborhood and community is an important skill.   You will want to take some steps to learn how.    Notes:        The patient verbalized understanding of instructions, educational materials, and care plan provided today and declined offer to receive copy of patient instructions, educational materials, and care plan.   Telephone follow up appointment with care management team member scheduled for:08/11/20    Elliot Gurney, [place] Worker  [facility] [phone]

## 2020-07-29 ENCOUNTER — Encounter: Payer: Medicare HMO | Admitting: Physical Therapy

## 2020-08-02 ENCOUNTER — Ambulatory Visit: Payer: Medicare HMO | Admitting: Physical Therapy

## 2020-08-04 ENCOUNTER — Encounter: Payer: Medicare HMO | Admitting: Physical Therapy

## 2020-08-09 ENCOUNTER — Ambulatory Visit: Payer: Medicare HMO | Admitting: Physical Therapy

## 2020-08-09 ENCOUNTER — Encounter: Payer: Medicare HMO | Admitting: Physical Therapy

## 2020-08-11 ENCOUNTER — Encounter: Payer: Medicare HMO | Admitting: Physical Therapy

## 2020-08-11 ENCOUNTER — Ambulatory Visit: Payer: Medicare HMO | Admitting: Physical Therapy

## 2020-08-11 ENCOUNTER — Ambulatory Visit (INDEPENDENT_AMBULATORY_CARE_PROVIDER_SITE_OTHER): Payer: Medicare HMO | Admitting: *Deleted

## 2020-08-11 DIAGNOSIS — R69 Illness, unspecified: Secondary | ICD-10-CM | POA: Diagnosis not present

## 2020-08-11 DIAGNOSIS — F341 Dysthymic disorder: Secondary | ICD-10-CM | POA: Diagnosis not present

## 2020-08-11 DIAGNOSIS — F439 Reaction to severe stress, unspecified: Secondary | ICD-10-CM

## 2020-08-11 DIAGNOSIS — F419 Anxiety disorder, unspecified: Secondary | ICD-10-CM

## 2020-08-11 NOTE — Patient Instructions (Signed)
Visit Information   Goals Addressed             This Visit's Progress    Find Help in My Community       Timeframe:  Long-Range Goal Priority:  Medium Start Date:    06/24/20                         Expected End Date:      12/24/20                 Follow Up Date 07/21/2020    - follow-up on any referrals for help I am given-grief counseling through Hospice and Palliative Care - make a list of family or friends that I can call  -continue to practice self care, taking one day at a time   Why is this important?   Knowing how and where to find help for yourself or family in your neighborhood and community is an important skill.  You will want to take some steps to learn how.    Notes:          The patient verbalized understanding of instructions, educational materials, and care plan provided today and declined offer to receive copy of patient instructions, educational materials, and care plan.   Telephone follow up appointment with care management team member scheduled for: 08/28/20   Elliot Gurney, [place] Worker  [facility] [phone]

## 2020-08-11 NOTE — Chronic Care Management (AMB) (Addendum)
Chronic Care Management    Clinical Social Work Note  08/11/2020 Name: Laura Love MRN: [id number] DOB: 1940-09-11  Laura Love is a 80 y.o. year old female who is a primary care patient of Fisher, Kirstie Peri, MD. The CCM team was consulted to assist the patient with chronic disease management and/or care coordination needs related to: Grief Counseling.   Engaged with patient by telephone for follow up visit in response to provider referral for social work chronic care management and care coordination services.   Consent to Services:  The patient was given information about Chronic Care Management services, agreed to services, and gave verbal consent prior to initiation of services.  Please see initial visit note for detailed documentation.   Patient agreed to services and consent obtained.   Assessment: Review of patient past medical history, allergies, medications, and health status, including review of relevant consultants reports was performed today as part of a comprehensive evaluation and provision of chronic care management and care coordination services.     SDOH (Social Determinants of Health) assessments and interventions performed:    Advanced Directives Status: Not addressed in this encounter.  CCM Care Plan  Allergies  Allergen Reactions   Alendronate Nausea Only   Crestor [Rosuvastatin Calcium]    Duloxetine Nausea Only   Pentazocine Other (See Comments)    Outpatient Encounter Medications as of 08/11/2020  Medication Sig Note   acetaminophen (TYLENOL) 325 MG tablet Take 650 mg by mouth every 6 (six) hours as needed. 1 a day    Biotin 1000 MCG tablet Take 1,000 mcg by mouth daily.    budesonide-formoterol (SYMBICORT) 160-4.5 MCG/ACT inhaler 1-2 puffs twice daily as needed    buPROPion (WELLBUTRIN SR) 150 MG 12 hr tablet TAKE ONE TABLET TWICE DAILY 04/20/2020: Takes once a day   CALCIUM PO Take 600 mg by mouth 2 (two) times daily. + D3 800 units    dicyclomine  (BENTYL) 10 MG capsule Take 1 capsule (10 mg total) by mouth 4 (four) times daily -  before meals and at bedtime. As needed for abdominal pain    diphenhydramine-acetaminophen (TYLENOL PM) 25-500 MG TABS tablet Take 1 tablet by mouth at bedtime.    DULoxetine (CYMBALTA) 30 MG capsule Take 1 capsule (30 mg total) by mouth daily.    ezetimibe (ZETIA) 10 MG tablet Take 1 tablet (10 mg total) by mouth daily.    fenofibrate (TRICOR) 48 MG tablet Take 1 tablet (48 mg total) by mouth daily.    ibuprofen (ADVIL,MOTRIN) 200 MG tablet Take 200 mg by mouth every 6 (six) hours as needed. 1 tablet daily    meclizine (ANTIVERT) 25 MG tablet Take 1 tablet (25 mg total) by mouth 3 (three) times daily as needed for dizziness.    Melatonin 3 MG TABS Take 1 tablet by mouth at bedtime.    sennosides-docusate sodium (SENOKOT-S) 8.6-50 MG tablet Take 2-3 tablets by mouth 2 (two) times daily. 3 tablets every morning, then 3-4 tablets in the evenings    SYNTHROID 100 MCG tablet TAKE 1 TABLET BY MOUTH DAILY 04/20/2020: Takes 1/2 tablet daily   VENTOLIN HFA 108 (90 Base) MCG/ACT inhaler INHALE 2 PUFFS INTO THE LUNGS EVERY SIX HOURS AS NEEDED FOR WHEEZING OR SHORTNESS OF BREATH    vitamin C (ASCORBIC ACID) 500 MG tablet Take 500 mg by mouth daily. 04/20/2020: occasionally   Vitamin D, Cholecalciferol, 1000 UNITS TABS Take 2 tablets by mouth daily.    Zinc 50 MG CAPS  Take by mouth.    zinc gluconate 50 MG tablet Take 50 mg by mouth daily as needed.    zoledronic acid (RECLAST) 5 MG/100ML SOLN injection Inject 5 mg into the vein. Once yearly starting June 2019 per Dr. Gabriel Carina    No facility-administered encounter medications on file as of 08/11/2020.    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   Cannot sleep 03/10/2007   Dysthymia 03/10/2007   Family history of breast cancer 03/10/2007    Conditions to be addressed/monitored: Depression; Mental Health Concerns   Care Plan : General Social Work (Adult)  Updates made by Vern Claude, LCSW since 08/11/2020 12:00 AM     Problem: Caregiver Stress      Goal: Caregiver Coping Optimized   Start Date: 06/24/2020  Expected End Date: 12/24/2020  This Visit's Progress: On track  Recent Progress: On track  Priority: High  Note:   Current Barriers:  Limited social support Suicidal Ideation/Homicidal Ideation: No  Clinical Social Work Goal(s):  Over the next 90 days, patient will work with SW bi-weekly by telephone or in person to reduce or manage symptoms related to caregiver stress patient will work with SW to address concerns related to caregiver stress and in home support for spouse  Interventions: Continued grief response due to recent loss of her spouse Confirmed that patient's mood is improving, making efforts to remain as active as possible Patient did discuss having difficulties getting motivated with discussion around taking one day at a time Verbalization of feelings encouraged, emotional support and reassurance provided Patient confirmed continued follow up with grief counseling through Hospice and Palliative Care-positive support system described through her friends and church family  Importance of self care continues to be emphasized  Patient Self Care Activities:  Performs ADL's independently Performs IADL's independently Calls provider office for new concerns or questions Ability for insight  Patient Coping Strengths:   Hopefulness Self Advocate Able to Communicate Effectively  Patient Self Care Deficits:  Knowledge deficit of community resources that can assist her spouse in the home            Follow Up Plan: SW will follow up with patient by phone over the next 30 business days       [place], [place] Worker  [facility] [phone]

## 2020-08-16 ENCOUNTER — Encounter: Payer: Medicare HMO | Admitting: Physical Therapy

## 2020-08-18 ENCOUNTER — Encounter: Payer: Medicare HMO | Admitting: Physical Therapy

## 2020-08-23 ENCOUNTER — Encounter: Payer: Medicare HMO | Admitting: Physical Therapy

## 2020-08-31 ENCOUNTER — Encounter: Payer: Self-pay | Admitting: Family Medicine

## 2020-08-31 ENCOUNTER — Ambulatory Visit (INDEPENDENT_AMBULATORY_CARE_PROVIDER_SITE_OTHER): Payer: Medicare HMO | Admitting: Family Medicine

## 2020-08-31 ENCOUNTER — Other Ambulatory Visit: Payer: Self-pay

## 2020-08-31 ENCOUNTER — Encounter: Payer: Medicare HMO | Admitting: Physical Therapy

## 2020-08-31 VITALS — BP 125/65 | HR 84 | Wt 140.0 lb

## 2020-08-31 DIAGNOSIS — F4321 Adjustment disorder with depressed mood: Secondary | ICD-10-CM | POA: Diagnosis not present

## 2020-08-31 DIAGNOSIS — R69 Illness, unspecified: Secondary | ICD-10-CM | POA: Diagnosis not present

## 2020-08-31 DIAGNOSIS — E039 Hypothyroidism, unspecified: Secondary | ICD-10-CM | POA: Diagnosis not present

## 2020-08-31 DIAGNOSIS — E785 Hyperlipidemia, unspecified: Secondary | ICD-10-CM

## 2020-08-31 NOTE — Progress Notes (Signed)
Established patient visit   Patient: Laura Love   DOB: [birth year]   80 y.o. Female  MRN: [id number] Visit Date: 08/31/2020  Today's healthcare provider: [name], MD   Chief Complaint  Patient presents with   Hyperlipidemia   Anxiety    Subjective    Anxiety Presents for initial visit. Symptoms include decreased concentration, nervous/anxious behavior and panic (Pt states this has improved.). Patient reports no dizziness, excessive worry, insomnia, palpitations, shortness of breath or suicidal ideas. The symptoms are aggravated by family issues (Pt has recenlty lost her husband and daughter.). The quality of sleep is good.     Lipid/Cholesterol, Follow-up  Last lipid panel Other pertinent labs  Lab Results  Component Value Date   CHOL 222 (H) 05/06/2020   HDL 61 05/06/2020   LDLCALC 142 (H) 05/06/2020   LDLDIRECT 159.3 10/24/2010   TRIG 108 05/06/2020   CHOLHDL 3.6 05/06/2020   Lab Results  Component Value Date   ALT 16 05/06/2020   AST 14 05/06/2020   PLT 225 05/06/2020   TSH 2.230 05/06/2020     She was last seen for this 3 months ago.  Management since that visit includes starting Fenofibrate at her request since she had taken it several years ago.   She reports poor compliance with treatment. She is having side effects. Pt reports she did not tolerate fenofibrate and had to stop.   Symptoms: No chest pain No chest pressure/discomfort  No dyspnea No lower extremity edema  No numbness or tingling of extremity No orthopnea  No palpitations No paroxysmal nocturnal dyspnea  No speech difficulty No syncope   Current diet: in general, a "healthy" diet   Current exercise: none  The 10-year ASCVD risk score Mikey Bussing DC Jr., et al., 2013) is: 23.4%  ---------------------------------------------------------------------------------------------------      Medications: Outpatient Medications Prior to Visit  Medication Sig   acetaminophen (TYLENOL)  325 MG tablet Take 650 mg by mouth every 6 (six) hours as needed. 1 a day   Biotin 1000 MCG tablet Take 1,000 mcg by mouth daily.   budesonide-formoterol (SYMBICORT) 160-4.5 MCG/ACT inhaler 1-2 puffs twice daily as needed   buPROPion (WELLBUTRIN SR) 150 MG 12 hr tablet TAKE ONE TABLET TWICE DAILY   CALCIUM PO Take 600 mg by mouth 2 (two) times daily. + D3 800 units   dicyclomine (BENTYL) 10 MG capsule Take 1 capsule (10 mg total) by mouth 4 (four) times daily -  before meals and at bedtime. As needed for abdominal pain   diphenhydramine-acetaminophen (TYLENOL PM) 25-500 MG TABS tablet Take 1 tablet by mouth at bedtime.   DULoxetine (CYMBALTA) 30 MG capsule Take 1 capsule (30 mg total) by mouth daily.   ezetimibe (ZETIA) 10 MG tablet Take 1 tablet (10 mg total) by mouth daily.   ibuprofen (ADVIL,MOTRIN) 200 MG tablet Take 200 mg by mouth every 6 (six) hours as needed. 1 tablet daily   meclizine (ANTIVERT) 25 MG tablet Take 1 tablet (25 mg total) by mouth 3 (three) times daily as needed for dizziness.   Melatonin 3 MG TABS Take 1 tablet by mouth at bedtime.   sennosides-docusate sodium (SENOKOT-S) 8.6-50 MG tablet Take 2-3 tablets by mouth 2 (two) times daily. 3 tablets every morning, then 3-4 tablets in the evenings   SYNTHROID 100 MCG tablet TAKE 1 TABLET BY MOUTH DAILY   VENTOLIN HFA 108 (90 Base) MCG/ACT inhaler INHALE 2 PUFFS INTO THE LUNGS EVERY SIX HOURS AS  NEEDED FOR WHEEZING OR SHORTNESS OF BREATH   vitamin C (ASCORBIC ACID) 500 MG tablet Take 500 mg by mouth daily.   Vitamin D, Cholecalciferol, 1000 UNITS TABS Take 2 tablets by mouth daily.   Zinc 50 MG CAPS Take by mouth.   zinc gluconate 50 MG tablet Take 50 mg by mouth daily as needed.   zoledronic acid (RECLAST) 5 MG/100ML SOLN injection Inject 5 mg into the vein. Once yearly starting June 2019 per Dr. Gabriel Carina   fenofibrate (TRICOR) 48 MG tablet Take 1 tablet (48 mg total) by mouth daily.   No facility-administered medications prior  to visit.    Review of Systems  Constitutional: Negative.   Respiratory: Negative.  Negative for shortness of breath.   Cardiovascular:  Negative for palpitations.  Gastrointestinal: Negative.   Musculoskeletal:  Positive for arthralgias and myalgias. Negative for back pain, gait problem, joint swelling, neck pain and neck stiffness.  Neurological:  Negative for dizziness, speech difficulty, light-headedness, numbness and headaches.  Psychiatric/Behavioral:  Positive for decreased concentration. Negative for dysphoric mood, self-injury, sleep disturbance and suicidal ideas. The patient is nervous/anxious. The patient does not have insomnia.       Objective    BP 125/65 (BP Location: Right Arm, Patient Position: Sitting, Cuff Size: Normal)   Pulse 84   Wt 140 lb (63.5 kg)   SpO2 96%   BMI 25.61 kg/m     Physical Exam  General appearance:  Well developed, well nourished female, cooperative and in no acute distress Head: Normocephalic, without obvious abnormality, atraumatic Respiratory: Respirations even and unlabored, normal respiratory rate Extremities: All extremities are intact.  Skin: Skin color, texture, turgor normal. No rashes seen  Psych: Appropriate mood and affect. Neurologic: Mental status: Alert, oriented to person, place, and time, thought content appropriate.     Assessment & Plan     1. Hypothyroidism, unspecified type Will be due to check tsh at follow up in 3 months.   2. Hyperlipidemia, unspecified hyperlipidemia type Intolerant to statins. Does Ok with ezetimibe. She previously asked to try fenofibrate which she had taken years ago. However she has since stopped it due to making her arthritis worse. Can continue ezetimibe monotherapy for now. Will check lipids at follow up in 3 months.   3. Grief reaction Due to recent loss of her husband and daughter. Doing OK with Wellbutrin. She would like to try buspirone which  her husband used to take. She still has  his bottle of 15mg  tablets and will try taking 1/3 tablet twice a day to help with her nerves.       The entirety of the information documented in the History of Present Illness, Review of Systems and Physical Exam were personally obtained by me. Portions of this information were initially documented by the CMA and reviewed by me for thoroughness and accuracy.     [name], MD  [facility] [phone] (phone) [phone] (fax)  [place]

## 2020-08-31 NOTE — Patient Instructions (Signed)
You can take 1/3 tablet of buspirone twice a day to help with nerves and anxiety

## 2020-09-01 ENCOUNTER — Telehealth: Payer: Self-pay | Admitting: *Deleted

## 2020-09-01 ENCOUNTER — Ambulatory Visit (INDEPENDENT_AMBULATORY_CARE_PROVIDER_SITE_OTHER): Payer: Medicare HMO | Admitting: *Deleted

## 2020-09-01 DIAGNOSIS — F419 Anxiety disorder, unspecified: Secondary | ICD-10-CM

## 2020-09-01 DIAGNOSIS — F4321 Adjustment disorder with depressed mood: Secondary | ICD-10-CM

## 2020-09-01 DIAGNOSIS — R69 Illness, unspecified: Secondary | ICD-10-CM | POA: Diagnosis not present

## 2020-09-01 DIAGNOSIS — F439 Reaction to severe stress, unspecified: Secondary | ICD-10-CM

## 2020-09-01 NOTE — Chronic Care Management (AMB) (Addendum)
Chronic Care Management    Clinical Social Work Note  09/01/2020 Name: Laura Love MRN: [id number] DOB: Jun 20, 1940  Laura Love is a 80 y.o. year old female who is a primary care patient of Fisher, Kirstie Peri, MD. The CCM team was consulted to assist the patient with chronic disease management and/or care coordination needs related to: Grief Counseling   Engaged with patient by telephone for follow up visit in response to provider referral for social work chronic care management and care coordination services.   Consent to Services:  The patient was given information about Chronic Care Management services, agreed to services, and gave verbal consent prior to initiation of services.  Please see initial visit note for detailed documentation.   Patient agreed to services and consent obtained.   Assessment: Review of patient past medical history, allergies, medications, and health status, including review of relevant consultants reports was performed today as part of a comprehensive evaluation and provision of chronic care management and care coordination services.     SDOH (Social Determinants of Health) assessments and interventions performed:    Advanced Directives Status: See Vyn[employer] application for related entries.  CCM Care Plan  Allergies  Allergen Reactions   Alendronate Nausea Only   Crestor [Rosuvastatin Calcium]    Duloxetine Nausea Only   Pentazocine Other (See Comments)    Outpatient Encounter Medications as of 09/01/2020  Medication Sig Note   acetaminophen (TYLENOL) 325 MG tablet Take 650 mg by mouth every 6 (six) hours as needed. 1 a day    Biotin 1000 MCG tablet Take 1,000 mcg by mouth daily.    budesonide-formoterol (SYMBICORT) 160-4.5 MCG/ACT inhaler 1-2 puffs twice daily as needed    buPROPion (WELLBUTRIN SR) 150 MG 12 hr tablet TAKE ONE TABLET TWICE DAILY 04/20/2020: Takes once a day   CALCIUM PO Take 600 mg by mouth 2 (two) times daily. + D3 800 units     dicyclomine (BENTYL) 10 MG capsule Take 1 capsule (10 mg total) by mouth 4 (four) times daily -  before meals and at bedtime. As needed for abdominal pain    diphenhydramine-acetaminophen (TYLENOL PM) 25-500 MG TABS tablet Take 1 tablet by mouth at bedtime.    ezetimibe (ZETIA) 10 MG tablet Take 1 tablet (10 mg total) by mouth daily.    ibuprofen (ADVIL,MOTRIN) 200 MG tablet Take 200 mg by mouth every 6 (six) hours as needed. 1 tablet daily    meclizine (ANTIVERT) 25 MG tablet Take 1 tablet (25 mg total) by mouth 3 (three) times daily as needed for dizziness.    Melatonin 3 MG TABS Take 1 tablet by mouth at bedtime.    sennosides-docusate sodium (SENOKOT-S) 8.6-50 MG tablet Take 2-3 tablets by mouth 2 (two) times daily. 3 tablets every morning, then 3-4 tablets in the evenings    SYNTHROID 100 MCG tablet TAKE 1 TABLET BY MOUTH DAILY 04/20/2020: Takes 1/2 tablet daily   VENTOLIN HFA 108 (90 Base) MCG/ACT inhaler INHALE 2 PUFFS INTO THE LUNGS EVERY SIX HOURS AS NEEDED FOR WHEEZING OR SHORTNESS OF BREATH    vitamin C (ASCORBIC ACID) 500 MG tablet Take 500 mg by mouth daily. 04/20/2020: occasionally   Vitamin D, Cholecalciferol, 1000 UNITS TABS Take 2 tablets by mouth daily.    Zinc 50 MG CAPS Take by mouth.    zinc gluconate 50 MG tablet Take 50 mg by mouth daily as needed.    zoledronic acid (RECLAST) 5 MG/100ML SOLN injection Inject 5 mg into  the vein. Once yearly starting June 2019 per Dr. Gabriel Carina    No facility-administered encounter medications on file as of 09/01/2020.    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   Cannot sleep 03/10/2007   Dysthymia 03/10/2007   Family history of breast cancer 03/10/2007    Conditions to be addressed/monitored: Depression; Mental Health Concerns   Care Plan : General Social Work (Adult)  Updates made by Vern Claude, LCSW since 09/01/2020 12:00 AM     Problem: Caregiver Stress      Goal: Caregiver Coping Optimized   Start Date: 06/24/2020  Expected End Date: 12/24/2020  This Visit's Progress: On track  Recent Progress: On track  Priority: High  Note:   Current Barriers:  Limited social support Suicidal Ideation/Homicidal Ideation: No  Clinical Social Work Goal(s):  Over the next 90 days, patient will work with SW bi-weekly by telephone or in person to reduce or manage symptoms related to grief and loss   Interventions: Continued grief response due to recent loss of her spouse Confirmed making efforts to remain as active as possible, however is having difficulty getting motivated Patient discussed taking care of spouse's affairs and has also finalized her Will Verbalization of feelings encouraged, emotional support and reassurance provided Patient confirmed continued follow up with grief counseling through Hospice and Palliative Care-positive support system described through her friends and church family  Importance of self care continues to be emphasized  Patient Self Care Activities:  Performs ADL's independently Performs IADL's independently Calls provider office for new concerns or questions Ability for insight  Patient Coping Strengths:  Hopefulness Self Advocate Able to Communicate Effectively  Patient Self Care Deficits:  Knowledge deficit of community resources that can assist her spouse in the home            Follow Up Plan: SW will follow up with patient by phone over the next 30  days       [place], [place] Worker  [facility] [phone]

## 2020-09-01 NOTE — Patient Instructions (Signed)
Visit Information   Goals Addressed             This Visit's Progress    Find Help in My Community       Timeframe:  Long-Range Goal Priority:  Medium Start Date:    06/24/20                         Expected End Date:      12/24/20                 Follow Up Date 08/062022    - follow-up on any referrals for help I am given-grief counseling through Hospice and Palliative Care - make a list of family or friends that I can call  -continue to practice self care, taking one day at a time   Why is this important?   Knowing how and where to find help for yourself or family in your neighborhood and community is an important skill.  You will want to take some steps to learn how.    Notes:          The patient verbalized understanding of instructions, educational materials, and care plan provided today and declined offer to receive copy of patient instructions, educational materials, and care plan.   Telephone follow up appointment with care management team member scheduled for: 09/29/20   Elliot Gurney, [place] Worker  [facility] [phone]

## 2020-09-01 NOTE — Telephone Encounter (Signed)
  Chronic Care Management   Follow Up Note   09/01/2020 Name: JAIA ALONGE MRN: [id number] DOB: 17-Jul-1940   Referred by: Birdie Sons, MD Reason for referral : Care Coordination   An unsuccessful telephone outreach was attempted today. The patient was referred to the case management team for assistance with care management and care coordination.   Follow Up Plan: Telephone follow up appointment with care management team member scheduled for: 09/08/20   Elliot Gurney, [place] Worker  [facility] [phone]

## 2020-09-02 ENCOUNTER — Encounter: Payer: Medicare HMO | Admitting: Physical Therapy

## 2020-09-08 ENCOUNTER — Telehealth: Payer: Self-pay

## 2020-09-29 ENCOUNTER — Ambulatory Visit (INDEPENDENT_AMBULATORY_CARE_PROVIDER_SITE_OTHER): Payer: Medicare HMO | Admitting: *Deleted

## 2020-09-29 DIAGNOSIS — F439 Reaction to severe stress, unspecified: Secondary | ICD-10-CM

## 2020-09-29 DIAGNOSIS — F4321 Adjustment disorder with depressed mood: Secondary | ICD-10-CM

## 2020-09-29 DIAGNOSIS — R69 Illness, unspecified: Secondary | ICD-10-CM | POA: Diagnosis not present

## 2020-09-29 DIAGNOSIS — F419 Anxiety disorder, unspecified: Secondary | ICD-10-CM

## 2020-09-29 NOTE — Chronic Care Management (AMB) (Signed)
Chronic Care Management    Clinical Social Work Note  09/29/2020 Name: Laura Love MRN: [id number] DOB: [birth year]  Laura Love is a 80 y.o. year old female who is a primary care patient of Fisher, Kirstie Peri, MD. The CCM team was consulted to assist the patient with chronic disease management and/or care coordination needs related to: Mental Health Counseling and Resources and Grief Counseling.   Engaged with patient by telephone for follow up visit in response to provider referral for social work chronic care management and care coordination services.   Consent to Services:  The patient was given information about Chronic Care Management services, agreed to services, and gave verbal consent prior to initiation of services.  Please see initial visit note for detailed documentation.   Patient agreed to services and consent obtained.   Assessment: Review of patient past medical history, allergies, medications, and health status, including review of relevant consultants reports was performed today as part of a comprehensive evaluation and provision of chronic care management and care coordination services.     SDOH (Social Determinants of Health) assessments and interventions performed:    Advanced Directives Status: Not addressed in this encounter.  CCM Care Plan  Allergies  Allergen Reactions   Alendronate Nausea Only   Crestor [Rosuvastatin Calcium]    Duloxetine Nausea Only   Pentazocine Other (See Comments)    Outpatient Encounter Medications as of 09/29/2020  Medication Sig Note   acetaminophen (TYLENOL) 325 MG tablet Take 650 mg by mouth every 6 (six) hours as needed. 1 a day    Biotin 1000 MCG tablet Take 1,000 mcg by mouth daily.    budesonide-formoterol (SYMBICORT) 160-4.5 MCG/ACT inhaler 1-2 puffs twice daily as needed    buPROPion (WELLBUTRIN SR) 150 MG 12 hr tablet TAKE ONE TABLET TWICE DAILY 04/20/2020: Takes once a day   CALCIUM PO Take 600 mg by mouth 2 (two) times  daily. + D3 800 units    dicyclomine (BENTYL) 10 MG capsule Take 1 capsule (10 mg total) by mouth 4 (four) times daily -  before meals and at bedtime. As needed for abdominal pain    diphenhydramine-acetaminophen (TYLENOL PM) 25-500 MG TABS tablet Take 1 tablet by mouth at bedtime.    ezetimibe (ZETIA) 10 MG tablet Take 1 tablet (10 mg total) by mouth daily.    ibuprofen (ADVIL,MOTRIN) 200 MG tablet Take 200 mg by mouth every 6 (six) hours as needed. 1 tablet daily    meclizine (ANTIVERT) 25 MG tablet Take 1 tablet (25 mg total) by mouth 3 (three) times daily as needed for dizziness.    Melatonin 3 MG TABS Take 1 tablet by mouth at bedtime.    sennosides-docusate sodium (SENOKOT-S) 8.6-50 MG tablet Take 2-3 tablets by mouth 2 (two) times daily. 3 tablets every morning, then 3-4 tablets in the evenings    SYNTHROID 100 MCG tablet TAKE 1 TABLET BY MOUTH DAILY 04/20/2020: Takes 1/2 tablet daily   VENTOLIN HFA 108 (90 Base) MCG/ACT inhaler INHALE 2 PUFFS INTO THE LUNGS EVERY SIX HOURS AS NEEDED FOR WHEEZING OR SHORTNESS OF BREATH    vitamin C (ASCORBIC ACID) 500 MG tablet Take 500 mg by mouth daily. 04/20/2020: occasionally   Vitamin D, Cholecalciferol, 1000 UNITS TABS Take 2 tablets by mouth daily.    Zinc 50 MG CAPS Take by mouth.    zinc gluconate 50 MG tablet Take 50 mg by mouth daily as needed.    zoledronic acid (RECLAST) 5 MG/100ML SOLN  injection Inject 5 mg into the vein. Once yearly starting June 2019 per Dr. Gabriel Carina    No facility-administered encounter medications on file as of 09/29/2020.    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   Cannot sleep 03/10/2007   Dysthymia 03/10/2007   Family history of breast cancer 03/10/2007    Conditions to be addressed/monitored: Depression; Mental Health Concerns   Care Plan : General Social Work (Adult)  Updates made by Vern Claude, LCSW since 09/29/2020 12:00 AM     Problem: Caregiver Stress      Goal: Caregiver Coping Optimized   Start Date: 06/24/2020  Expected End Date: 12/24/2020  Recent Progress: On track  Priority: High  Note:   Current Barriers:  Limited social support Suicidal Ideation/Homicidal Ideation: No  Clinical Social Work Goal(s):  Over the next 90 days, patient will work with SW bi-weekly by telephone or in person to reduce or manage symptoms related to grief and loss   Interventions: Continued grief response due to recent loss of her spouse Confirmed making efforts to remain as active as possible, however is having continued difficulty getting motivated Patient discussed continued efforts to take care of spouse's affairs -focusing on one task at a time Recommendation of having a daily schedule discussed as a coping strategy  Verbalization of feelings encouraged, emotional support and reassurance provided Patient confirmed continued intention to follow up with grief counseling through Hospice and Palliative Care, however has not heard from them lately-positive support system described through her friends and church family  Importance of self care continues to be emphasized  Patient Self Care Activities:  Performs ADL's independently Performs IADL's independently Calls provider office for new concerns or questions Ability for insight  Patient Coping Strengths:  Hopefulness Self Advocate Able to Communicate Effectively  Patient Self Care Deficits:  Knowledge deficit of  community resources that can assist her spouse in the home            Follow Up Plan: SW will follow up with patient by phone over the next 30 business days       [place], [place] Worker  [facility] [phone]

## 2020-09-29 NOTE — Patient Instructions (Signed)
Visit Information   Goals Addressed             This Visit's Progress    Find Help in My Community       Timeframe:  Long-Range Goal Priority:  Medium Start Date:    06/24/20                         Expected End Date:      12/24/20                 Follow Up Date 11/02/20   - follow-up on any referrals for help I am given-grief counseling through Hospice and Palliative Care - make a list of family or friends that I can call  -continue to practice self care, taking one day at a time   Why is this important?   Knowing how and where to find help for yourself or family in your neighborhood and community is an important skill.  You will want to take some steps to learn how.    Notes:         The patient verbalized understanding of instructions, educational materials, and care plan provided today and declined offer to receive copy of patient instructions, educational materials, and care plan.   Telephone follow up appointment with care management team member scheduled for: 11/02/20   Elliot Gurney, [place] Worker  [facility] [phone]

## 2020-10-22 ENCOUNTER — Other Ambulatory Visit: Payer: Self-pay | Admitting: Family Medicine

## 2020-10-22 DIAGNOSIS — F419 Anxiety disorder, unspecified: Secondary | ICD-10-CM

## 2020-11-02 ENCOUNTER — Telehealth: Payer: Self-pay | Admitting: *Deleted

## 2020-11-02 NOTE — Telephone Encounter (Signed)
  Care Management   Follow Up Note   11/02/2020 Name: Laura Love MRN: [id number] DOB: [birth year]   Referred by: Birdie Sons, MD Reason for referral : Care Coordination   An unsuccessful telephone outreach was attempted today. The patient was referred to the case management team for assistance with care management and care coordination.   Follow Up Plan: Telephone follow up appointment with care management team member scheduled for:11/09/20   Elliot Gurney, [place] Worker  [facility] [phone]

## 2020-11-03 ENCOUNTER — Ambulatory Visit (INDEPENDENT_AMBULATORY_CARE_PROVIDER_SITE_OTHER): Payer: Medicare HMO | Admitting: *Deleted

## 2020-11-03 DIAGNOSIS — F419 Anxiety disorder, unspecified: Secondary | ICD-10-CM

## 2020-11-03 DIAGNOSIS — F4321 Adjustment disorder with depressed mood: Secondary | ICD-10-CM

## 2020-11-03 DIAGNOSIS — F439 Reaction to severe stress, unspecified: Secondary | ICD-10-CM

## 2020-11-03 NOTE — Patient Instructions (Signed)
Visit Information   Goals Addressed             This Visit's Progress    Find Help in My Community       Timeframe:  Long-Range Goal Priority:  Medium Start Date:    06/24/20                         Expected End Date:      11/03/20                Follow Up Date 11/03/20   - follow-up on any referrals for help I am given-grief counseling through Hospice and Palliative Care if needed - make a list of family or friends that I can call  -continue to practice self care   Why is this important?   Knowing how and where to find help for yourself or family in your neighborhood and community is an important skill.  You will want to take some steps to learn how.    Notes:         The patient verbalized understanding of instructions, educational materials, and care plan provided today and declined offer to receive copy of patient instructions, educational materials, and care plan.   No further follow up required: patient to contact this Education officer, museum with any additional community resource needs   [employer], [place] Worker  [facility] [phone]

## 2020-11-03 NOTE — Chronic Care Management (AMB) (Signed)
Chronic Care Management    Clinical Social Work Note  11/03/2020 Name: Laura Love MRN: [id number] DOB: Nov 23, 1940  Laura Love is a 80 y.o. year old female who is a primary care patient of Fisher, Kirstie Peri, MD. The CCM team was consulted to assist the patient with chronic disease management and/or care coordination needs related to: Grief Counseling.   Engaged with patient by telephone for follow up visit in response to provider referral for social work chronic care management and care coordination services.   Consent to Services:  The patient was given information about Chronic Care Management services, agreed to services, and gave verbal consent prior to initiation of services.  Please see initial visit note for detailed documentation.   Patient agreed to services and consent obtained.   Assessment: Review of patient past medical history, allergies, medications, and health status, including review of relevant consultants reports was performed today as part of a comprehensive evaluation and provision of chronic care management and care coordination services.     SDOH (Social Determinants of Health) assessments and interventions performed:    Advanced Directives Status: Not addressed in this encounter.  CCM Care Plan  Allergies  Allergen Reactions   Alendronate Nausea Only   Crestor [Rosuvastatin Calcium]    Duloxetine Nausea Only   Pentazocine Other (See Comments)    Outpatient Encounter Medications as of 11/03/2020  Medication Sig Note   acetaminophen (TYLENOL) 325 MG tablet Take 650 mg by mouth every 6 (six) hours as needed. 1 a day    Biotin 1000 MCG tablet Take 1,000 mcg by mouth daily.    budesonide-formoterol (SYMBICORT) 160-4.5 MCG/ACT inhaler 1-2 puffs twice daily as needed    buPROPion (WELLBUTRIN SR) 150 MG 12 hr tablet TAKE ONE TABLET TWICE DAILY    CALCIUM PO Take 600 mg by mouth 2 (two) times daily. + D3 800 units    dicyclomine (BENTYL) 10 MG capsule Take 1  capsule (10 mg total) by mouth 4 (four) times daily -  before meals and at bedtime. As needed for abdominal pain    diphenhydramine-acetaminophen (TYLENOL PM) 25-500 MG TABS tablet Take 1 tablet by mouth at bedtime.    ezetimibe (ZETIA) 10 MG tablet Take 1 tablet (10 mg total) by mouth daily.    ibuprofen (ADVIL,MOTRIN) 200 MG tablet Take 200 mg by mouth every 6 (six) hours as needed. 1 tablet daily    meclizine (ANTIVERT) 25 MG tablet Take 1 tablet (25 mg total) by mouth 3 (three) times daily as needed for dizziness.    Melatonin 3 MG TABS Take 1 tablet by mouth at bedtime.    sennosides-docusate sodium (SENOKOT-S) 8.6-50 MG tablet Take 2-3 tablets by mouth 2 (two) times daily. 3 tablets every morning, then 3-4 tablets in the evenings    SYNTHROID 100 MCG tablet TAKE 1 TABLET BY MOUTH DAILY 04/20/2020: Takes 1/2 tablet daily   VENTOLIN HFA 108 (90 Base) MCG/ACT inhaler INHALE 2 PUFFS INTO THE LUNGS EVERY SIX HOURS AS NEEDED FOR WHEEZING OR SHORTNESS OF BREATH    vitamin C (ASCORBIC ACID) 500 MG tablet Take 500 mg by mouth daily. 04/20/2020: occasionally   Vitamin D, Cholecalciferol, 1000 UNITS TABS Take 2 tablets by mouth daily.    Zinc 50 MG CAPS Take by mouth.    zinc gluconate 50 MG tablet Take 50 mg by mouth daily as needed.    zoledronic acid (RECLAST) 5 MG/100ML SOLN injection Inject 5 mg into the vein. Once yearly starting  June 2019 per Dr. Gabriel Carina    No facility-administered encounter medications on file as of 11/03/2020.    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   Cannot sleep 03/10/2007   Dysthymia 03/10/2007   Family history of breast cancer 03/10/2007    Conditions to be addressed/monitored: Depression; Mental Health Concerns   Care Plan : General Social Work (Adult)  Updates made by Vern Claude, LCSW since 11/03/2020 12:00 AM     Problem: Caregiver Stress      Goal: Caregiver Coping Optimized   Start Date: 06/24/2020  This Visit's Progress: On track  Recent Progress: On track  Priority: High  Note:   Current Barriers:  Limited social support Suicidal Ideation/Homicidal Ideation: No  Clinical Social Work Goal(s):  Over the next 90 days, patient will work with SW bi-weekly by telephone or in person to reduce or manage symptoms related to grief and loss   Interventions: Continued follow up to assess for support needs regarding the loss of her spouse Patient confirmed improvement in her mood and family life Confirmed making efforts to remain as active as possible,stating that she coordinates activities to ensure that she get's out-patient further confirmed plan to participate in the activities at the [facility] Patient discussed continued efforts to take care of spouse's affairs as well as her own and is now working with an Forensic psychologist for estate planning Patient verbalizes having a positive support group of family and friends, strong spiritual life and the will to continue moving forward Verbalization of feelings encouraged, emotional support and reassurance provided Importance of self care continues to be emphasized Patient verbalized having no further community resource needs at this time Sport and exercise psychologist information for this Education officer, museum provided to patient in the event that patient has any further community resource needs  Patient Self Care Activities:  Performs ADL's independently Performs IADL's independently Calls provider office  for new concerns or questions Ability for insight  Patient Coping Strengths:  Hopefulness Self Advocate Able to Communicate Effectively  Patient Self Care Deficits:  Knowledge deficit of community resources that can assist her spouse in the home            Follow Up Plan: Client will contact this Education officer, museum with any additional community resource needs        [place], [place] Social Worker  [facility] [phone]

## 2020-11-09 ENCOUNTER — Telehealth: Payer: Medicare HMO

## 2020-11-24 ENCOUNTER — Ambulatory Visit: Payer: Medicare HMO | Admitting: *Deleted

## 2020-11-24 ENCOUNTER — Other Ambulatory Visit: Payer: Self-pay

## 2020-11-24 DIAGNOSIS — F4321 Adjustment disorder with depressed mood: Secondary | ICD-10-CM

## 2020-11-24 DIAGNOSIS — F419 Anxiety disorder, unspecified: Secondary | ICD-10-CM

## 2020-11-24 DIAGNOSIS — E039 Hypothyroidism, unspecified: Secondary | ICD-10-CM

## 2020-11-24 DIAGNOSIS — F439 Reaction to severe stress, unspecified: Secondary | ICD-10-CM

## 2020-11-24 DIAGNOSIS — R42 Dizziness and giddiness: Secondary | ICD-10-CM

## 2020-11-24 MED ORDER — MECLIZINE HCL 25 MG PO TABS: 25.0000 mg | ORAL_TABLET | Freq: Three times a day (TID) | ORAL | 3 refills | Status: DC | PRN

## 2020-11-24 NOTE — Telephone Encounter (Signed)
Copied from [place] [id number]. Topic: General - Other >> Nov 24, 2020 10:12 AM Bayard Beaver wrote: Reason for CRM: Patient called about getting medicine for vertigo. She isnt sure what the name of it is. Please call back

## 2020-11-24 NOTE — Telephone Encounter (Signed)
LOV: 08/31/2020 NOV: 12/10/2020   Last Refill: 04/07/2016  #30 0 Refills.   Thanks,   -Mickel Baas

## 2020-11-26 DIAGNOSIS — F4321 Adjustment disorder with depressed mood: Secondary | ICD-10-CM | POA: Diagnosis not present

## 2020-11-26 DIAGNOSIS — R69 Illness, unspecified: Secondary | ICD-10-CM | POA: Diagnosis not present

## 2020-11-26 DIAGNOSIS — E039 Hypothyroidism, unspecified: Secondary | ICD-10-CM | POA: Diagnosis not present

## 2020-11-26 NOTE — Chronic Care Management (AMB) (Signed)
Chronic Care Management    Clinical Social Work Note  11/26/2020 Name: Laura Love MRN: [id number] DOB: 1940/08/01  Laura Love is a 80 y.o. year old female who is a primary care patient of Fisher, Kirstie Peri, MD. The CCM team was consulted to assist the patient with chronic disease management and/or care coordination needs related to: Mental Health Counseling and Resources.   Engaged with patient by telephone for follow up visit in response to provider referral for social work chronic care management and care coordination services.   Consent to Services:  The patient was given information about Chronic Care Management services, agreed to services, and gave verbal consent prior to initiation of services.  Please see initial visit note for detailed documentation.   Patient agreed to services and consent obtained.   Assessment: Review of patient past medical history, allergies, medications, and health status, including review of relevant consultants reports was performed today as part of a comprehensive evaluation and provision of chronic care management and care coordination services.     SDOH (Social Determinants of Health) assessments and interventions performed:    Advanced Directives Status: Not addressed in this encounter.  CCM Care Plan  Allergies  Allergen Reactions   Alendronate Nausea Only   Crestor [Rosuvastatin Calcium]    Duloxetine Nausea Only   Pentazocine Other (See Comments)    Outpatient Encounter Medications as of 11/24/2020  Medication Sig Note   acetaminophen (TYLENOL) 325 MG tablet Take 650 mg by mouth every 6 (six) hours as needed. 1 a day    Biotin 1000 MCG tablet Take 1,000 mcg by mouth daily.    budesonide-formoterol (SYMBICORT) 160-4.5 MCG/ACT inhaler 1-2 puffs twice daily as needed    buPROPion (WELLBUTRIN SR) 150 MG 12 hr tablet TAKE ONE TABLET TWICE DAILY    CALCIUM PO Take 600 mg by mouth 2 (two) times daily. + D3 800 units    dicyclomine  (BENTYL) 10 MG capsule Take 1 capsule (10 mg total) by mouth 4 (four) times daily -  before meals and at bedtime. As needed for abdominal pain    diphenhydramine-acetaminophen (TYLENOL PM) 25-500 MG TABS tablet Take 1 tablet by mouth at bedtime.    ezetimibe (ZETIA) 10 MG tablet Take 1 tablet (10 mg total) by mouth daily.    ibuprofen (ADVIL,MOTRIN) 200 MG tablet Take 200 mg by mouth every 6 (six) hours as needed. 1 tablet daily    meclizine (ANTIVERT) 25 MG tablet Take 1 tablet (25 mg total) by mouth 3 (three) times daily as needed for dizziness.    Melatonin 3 MG TABS Take 1 tablet by mouth at bedtime.    sennosides-docusate sodium (SENOKOT-S) 8.6-50 MG tablet Take 2-3 tablets by mouth 2 (two) times daily. 3 tablets every morning, then 3-4 tablets in the evenings    SYNTHROID 100 MCG tablet TAKE 1 TABLET BY MOUTH DAILY 04/20/2020: Takes 1/2 tablet daily   VENTOLIN HFA 108 (90 Base) MCG/ACT inhaler INHALE 2 PUFFS INTO THE LUNGS EVERY SIX HOURS AS NEEDED FOR WHEEZING OR SHORTNESS OF BREATH    vitamin C (ASCORBIC ACID) 500 MG tablet Take 500 mg by mouth daily. 04/20/2020: occasionally   Vitamin D, Cholecalciferol, 1000 UNITS TABS Take 2 tablets by mouth daily.    Zinc 50 MG CAPS Take by mouth.    zinc gluconate 50 MG tablet Take 50 mg by mouth daily as needed.    zoledronic acid (RECLAST) 5 MG/100ML SOLN injection Inject 5 mg into the vein.  Once yearly starting June 2019 per Dr. Gabriel Carina    No facility-administered encounter medications on file as of 11/24/2020.    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   Cannot sleep 03/10/2007   Dysthymia 03/10/2007   Family history of breast cancer 03/10/2007    Conditions to be addressed/monitored: Grief Counseling; Mental Health Concerns   Care Plan : General Social Work (Adult)  Updates made by Vern Claude, LCSW since 11/26/2020 12:00 AM     Problem: Caregiver Stress      Goal: Caregiver Coping Optimized   Start Date: 06/24/2020  This Visit's Progress: On track  Recent Progress: On track  Priority: High  Note:   Current Barriers:  Limited social support Suicidal Ideation/Homicidal Ideation: No  Clinical Social Work Goal(s):  Over the next 90 days, patient will work with SW bi-weekly by telephone or in person to reduce or manage symptoms related to grief and loss  Interventions: Continued follow up to assess for support needs regarding the loss of her spouse Patient confirmed improvement in her mood and family life-making efforts to stay away from triggers/focus of family member's  death Patient confirmed plan of self care-making efforts to coordinate activities out of the house daily to remain active- ie her high school reunion is tomorrow of which she plans plans to attend Confirmed additional plan to [facility]-signed up for smart phone class(cancelled hopefully next week) Patient continues to verbalize having a positive support group of family and friends, strong spiritual life and the will to continue moving forward Verbalization of feelings encouraged, emotional support and reassurance provided Importance of self care continues to be emphasized Contact information for this social worker provided to patient in the event that patient has any further community resource needs  Patient Self Care Activities:  Performs ADL's independently Performs IADL's  independently Calls provider office for new concerns or questions Ability for insight  Patient Coping Strengths:  Hopefulness Self Advocate Able to Communicate Effectively  Patient Self Care Deficits:  Knowledge deficit of community resources that can assist her spouse in the home            Follow Up Plan: SW will follow up with patient by phone over the next 30 business days       [place], [place] Worker  [facility] [phone]

## 2020-11-26 NOTE — Patient Instructions (Signed)
   Goals Addressed             This Visit's Progress    Find Help in My Community       Timeframe:  Long-Range Goal Priority:  Medium Start Date:    06/24/20                         Expected End Date:      11/03/20                Follow Up Date 12/23/20   - follow-up on any referrals for help I am given-grief counseling through Hospice and Palliative Care if needed - make a list of family or friends that I can call  -continue to practice and prioritize self care   Why is this important?   Knowing how and where to find help for yourself or family in your neighborhood and community is an important skill.  You will want to take some steps to learn how.    Notes:         The patient verbalized understanding of instructions, educational materials, and care plan provided today and declined offer to receive copy of patient instructions, educational materials, and care plan.   Telephone follow up appointment with care management team member scheduled for:12/22/20   Elliot Gurney, [place] Worker  [facility] [phone]

## 2020-11-30 ENCOUNTER — Other Ambulatory Visit: Payer: Self-pay | Admitting: Family Medicine

## 2020-12-10 ENCOUNTER — Ambulatory Visit: Payer: Medicare HMO | Admitting: Family Medicine

## 2020-12-21 ENCOUNTER — Ambulatory Visit (INDEPENDENT_AMBULATORY_CARE_PROVIDER_SITE_OTHER): Payer: Medicare HMO | Admitting: *Deleted

## 2020-12-21 DIAGNOSIS — F439 Reaction to severe stress, unspecified: Secondary | ICD-10-CM

## 2020-12-21 DIAGNOSIS — F4321 Adjustment disorder with depressed mood: Secondary | ICD-10-CM

## 2020-12-21 DIAGNOSIS — F419 Anxiety disorder, unspecified: Secondary | ICD-10-CM

## 2020-12-21 NOTE — Chronic Care Management (AMB) (Signed)
Chronic Care Management    Clinical Social Work Note  12/21/2020 Name: Laura Love MRN: [id number] DOB: [birth year]  Laura Love is a 80 y.o. year old female who is a primary care patient of Fisher, Kirstie Peri, MD. The CCM team was consulted to assist the patient with chronic disease management and/or care coordination needs related to: Mental Health Counseling and Resources.   Engaged with patient by telephone for follow up visit in response to provider referral for social work chronic care management and care coordination services.   Consent to Services:  The patient was given information about Chronic Care Management services, agreed to services, and gave verbal consent prior to initiation of services.  Please see initial visit note for detailed documentation.   Patient agreed to services and consent obtained.   Assessment: Review of patient past medical history, allergies, medications, and health status, including review of relevant consultants reports was performed today as part of a comprehensive evaluation and provision of chronic care management and care coordination services.     SDOH (Social Determinants of Health) assessments and interventions performed:    Advanced Directives Status: Not addressed in this encounter.  CCM Care Plan  Allergies  Allergen Reactions   Alendronate Nausea Only   Crestor [Rosuvastatin Calcium]    Duloxetine Nausea Only   Pentazocine Other (See Comments)    Outpatient Encounter Medications as of 12/21/2020  Medication Sig Note   acetaminophen (TYLENOL) 325 MG tablet Take 650 mg by mouth every 6 (six) hours as needed. 1 a day    Biotin 1000 MCG tablet Take 1,000 mcg by mouth daily.    budesonide-formoterol (SYMBICORT) 160-4.5 MCG/ACT inhaler 1-2 puffs twice daily as needed    buPROPion (WELLBUTRIN SR) 150 MG 12 hr tablet TAKE ONE TABLET TWICE DAILY    CALCIUM PO Take 600 mg by mouth 2 (two) times daily. + D3 800 units    dicyclomine  (BENTYL) 10 MG capsule Take 1 capsule (10 mg total) by mouth 4 (four) times daily -  before meals and at bedtime. As needed for abdominal pain    diphenhydramine-acetaminophen (TYLENOL PM) 25-500 MG TABS tablet Take 1 tablet by mouth at bedtime.    ezetimibe (ZETIA) 10 MG tablet Take 1 tablet (10 mg total) by mouth daily.    ibuprofen (ADVIL,MOTRIN) 200 MG tablet Take 200 mg by mouth every 6 (six) hours as needed. 1 tablet daily    meclizine (ANTIVERT) 25 MG tablet Take 1 tablet (25 mg total) by mouth 3 (three) times daily as needed for dizziness.    Melatonin 3 MG TABS Take 1 tablet by mouth at bedtime.    sennosides-docusate sodium (SENOKOT-S) 8.6-50 MG tablet Take 2-3 tablets by mouth 2 (two) times daily. 3 tablets every morning, then 3-4 tablets in the evenings    SYNTHROID 100 MCG tablet TAKE 1 TABLET BY MOUTH DAILY    VENTOLIN HFA 108 (90 Base) MCG/ACT inhaler INHALE 2 PUFFS INTO THE LUNGS EVERY SIX HOURS AS NEEDED FOR WHEEZING OR SHORTNESS OF BREATH    vitamin C (ASCORBIC ACID) 500 MG tablet Take 500 mg by mouth daily. 04/20/2020: occasionally   Vitamin D, Cholecalciferol, 1000 UNITS TABS Take 2 tablets by mouth daily.    Zinc 50 MG CAPS Take by mouth.    zinc gluconate 50 MG tablet Take 50 mg by mouth daily as needed.    zoledronic acid (RECLAST) 5 MG/100ML SOLN injection Inject 5 mg into the vein. Once yearly starting June  2019 per Dr. Gabriel Carina    No facility-administered encounter medications on file as of 12/21/2020.    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   Cannot sleep 03/10/2007   Dysthymia 03/10/2007   Family history of breast cancer 03/10/2007    Conditions to be addressed/monitored: Depression; Mental Health Concerns   Care Plan : General Social Work (Adult)  Updates made by Vern Claude, LCSW since 12/21/2020 12:00 AM     Problem: Caregiver Stress      Goal: Caregiver Coping Optimized   Start Date: 06/24/2020  This Visit's Progress: On track  Recent Progress: On track  Priority: High  Note:   Current Barriers:  Limited social support Suicidal Ideation/Homicidal Ideation: No  Clinical Social Work Goal(s):  Over the next 90 days, patient will work with SW bi-weekly by telephone or in person to reduce or manage symptoms related to grief and loss  Interventions: realization that life has changed, church members came by, working on will Continued follow up to assess for support needs regarding the loss of her spouse Normalized grief response to the loss of her spouse Reinforced plan for self care Confirmed additional plan to participate in the activities at the [facility] Patient continues to verbalize having a positive support group of family and friends, strong spiritual life and the will to continue moving forward Verbalization of feelings encouraged, emotional support and reassurance provided Importance of self care continues to be emphasized Contact information for this social worker provided to patient in the event that patient has any further community resource needs  Patient Self Care Activities:  Performs ADL's independently Performs IADL's independently Calls provider office for new concerns or questions Ability for insight  Patient Coping Strengths:  Hopefulness Self Advocate Able to Communicate Effectively  Patient Self Care Deficits:  Knowledge deficit of community resources that can assist her  spouse in the home            Follow Up Plan: SW will follow up with patient by phone over the next 30 business days      [employer], [place] Worker  [facility] [phone]

## 2020-12-21 NOTE — Patient Instructions (Signed)
Visit Information   Goals Addressed             This Visit's Progress    Find Help in My Community       Timeframe:  Long-Range Goal Priority:  Medium Start Date:    06/24/20                         Expected End Date:      11/03/20                Follow Up Date 01/18/21   - follow-up on any referrals for help I am given-grief counseling through Hospice and Palliative Care if needed - make a list of family or friends that I can call  -continue to practice and prioritize self care   Why is this important?   Knowing how and where to find help for yourself or family in your neighborhood and community is an important skill.  You will want to take some steps to learn how.    Notes:         The patient verbalized understanding of instructions, educational materials, and care plan provided today and declined offer to receive copy of patient instructions, educational materials, and care plan.   Telephone follow up appointment with care management team member scheduled for:01/18/21   Elliot Gurney, [place] Worker  [facility] [phone]

## 2020-12-23 ENCOUNTER — Telehealth: Payer: Medicare HMO

## 2020-12-24 ENCOUNTER — Encounter: Payer: Self-pay | Admitting: Family Medicine

## 2020-12-24 ENCOUNTER — Other Ambulatory Visit: Payer: Self-pay

## 2020-12-24 ENCOUNTER — Ambulatory Visit (INDEPENDENT_AMBULATORY_CARE_PROVIDER_SITE_OTHER): Payer: Medicare HMO | Admitting: Family Medicine

## 2020-12-24 VITALS — BP 147/83 | HR 72 | Temp 98.6°F | Resp 18 | Wt 143.0 lb

## 2020-12-24 DIAGNOSIS — Z289 Immunization not carried out for unspecified reason: Secondary | ICD-10-CM | POA: Diagnosis not present

## 2020-12-24 DIAGNOSIS — F4321 Adjustment disorder with depressed mood: Secondary | ICD-10-CM | POA: Diagnosis not present

## 2020-12-24 DIAGNOSIS — M810 Age-related osteoporosis without current pathological fracture: Secondary | ICD-10-CM | POA: Diagnosis not present

## 2020-12-24 DIAGNOSIS — E785 Hyperlipidemia, unspecified: Secondary | ICD-10-CM

## 2020-12-24 DIAGNOSIS — R69 Illness, unspecified: Secondary | ICD-10-CM | POA: Diagnosis not present

## 2020-12-24 DIAGNOSIS — F439 Reaction to severe stress, unspecified: Secondary | ICD-10-CM

## 2020-12-24 DIAGNOSIS — E039 Hypothyroidism, unspecified: Secondary | ICD-10-CM

## 2020-12-24 MED ORDER — SHINGRIX 50 MCG/0.5ML IM SUSR: 0.5000 mL | Freq: Once | INTRAMUSCULAR | 0 refills | Status: AC

## 2020-12-24 NOTE — Progress Notes (Signed)
Established patient visit   Patient: Laura Love   DOB: 30-Jan-1941   80 y.o. Female  MRN: [id number] Visit Date: 12/24/2020  Today's healthcare provider: [name], MD   Chief Complaint  Patient presents with   Hypothyroidism   Depression   Subjective    HPI  Hypothyroid, follow-up  Lab Results  Component Value Date   TSH 2.230 05/06/2020   TSH 2.360 06/10/2019   TSH 1.860 08/22/2017   Wt Readings from Last 3 Encounters:  12/24/20 143 lb (64.9 kg)  08/31/20 140 lb (63.5 kg)  06/22/20 140 lb 9.6 oz (63.8 kg)    She was last seen for hypothyroid 3 months ago.  Management since that visit includes no changes were made. She reports good compliance with treatment. She is not having side effects.   Symptoms: No change in energy level No constipation  No diarrhea No heat / cold intolerance  No nervousness No palpitations  No weight changes    ----------------------------------------------------------------------------------------- Depression, Follow-up  She  was last seen for this 3 months ago. Changes made at last visit include try adding her husbands buspirone.   She reports fair compliance with treatment. Patient does decided not to try that and instead starting taking a second Bupropion 2 tablet every day. She feels that this has helped with improving her depression.  She is not having side effects.   She reports good tolerance of treatment. Current symptoms include: depressed mood She feels she is Improved since last visit.  Depression screen [facility] 2/9 12/24/2020 04/20/2020 03/24/2019  Decreased Interest 1 0 0  Down, Depressed, Hopeless 1 0 1  PHQ - 2 Score 2 0 1  Altered sleeping 0 - -  Tired, decreased energy 0 - -  Change in appetite 0 - -  Feeling bad or failure about yourself  0 - -  Trouble concentrating 0 - -  Moving slowly or fidgety/restless 0 - -  Suicidal thoughts 0 - -  PHQ-9 Score 2 - -  Difficult doing work/chores Not difficult  at all - -    -----------------------------------------------------------------------------------------  Lipid/Cholesterol, Follow-up  Last lipid panel Other pertinent labs  Lab Results  Component Value Date   CHOL 222 (H) 05/06/2020   HDL 61 05/06/2020   LDLCALC 142 (H) 05/06/2020   LDLDIRECT 159.3 10/24/2010   TRIG 108 05/06/2020   CHOLHDL 3.6 05/06/2020   Lab Results  Component Value Date   ALT 16 05/06/2020   AST 14 05/06/2020   PLT 225 05/06/2020   TSH 2.230 05/06/2020     She was last seen for this 3 months ago.  Management since that visit includes continuing ezetimibe monotherapy for now. Will check lipids at follow up in 3 months.   She reports good compliance with treatment. She is not having side effects.   Symptoms: No chest pain No chest pressure/discomfort  No dyspnea No lower extremity edema  No numbness or tingling of extremity No orthopnea  No palpitations No paroxysmal nocturnal dyspnea  No speech difficulty No syncope   Current diet: well balanced Current exercise: none  The ASCVD Risk score (Arnett DK, et al., 2019) failed to calculate for the following reasons:   The 2019 ASCVD risk score is only valid for ages 107 to 32  ---------------------------------------------------------------------------------------------------  She also has osteoporosis and is due for routine DEXA scan which she would like to go to [facility] for.     Medications: Outpatient Medications Prior to Visit  Medication Sig   acetaminophen (TYLENOL) 325 MG tablet Take 650 mg by mouth every 6 (six) hours as needed. 1 a day   Biotin 1000 MCG tablet Take 1,000 mcg by mouth daily.   budesonide-formoterol (SYMBICORT) 160-4.5 MCG/ACT inhaler 1-2 puffs twice daily as needed   buPROPion (WELLBUTRIN SR) 150 MG 12 hr tablet TAKE ONE TABLET TWICE DAILY   CALCIUM PO Take 600 mg by mouth 2 (two) times daily. + D3 800 units   dicyclomine (BENTYL) 10 MG capsule Take 1 capsule (10  mg total) by mouth 4 (four) times daily -  before meals and at bedtime. As needed for abdominal pain   diphenhydramine-acetaminophen (TYLENOL PM) 25-500 MG TABS tablet Take 1 tablet by mouth at bedtime.   ezetimibe (ZETIA) 10 MG tablet Take 1 tablet (10 mg total) by mouth daily.   ibuprofen (ADVIL,MOTRIN) 200 MG tablet Take 200 mg by mouth every 6 (six) hours as needed. 1 tablet daily   meclizine (ANTIVERT) 25 MG tablet Take 1 tablet (25 mg total) by mouth 3 (three) times daily as needed for dizziness.   Melatonin 3 MG TABS Take 1 tablet by mouth at bedtime.   sennosides-docusate sodium (SENOKOT-S) 8.6-50 MG tablet Take 2-3 tablets by mouth 2 (two) times daily. 3 tablets every morning, then 3-4 tablets in the evenings   SYNTHROID 100 MCG tablet TAKE 1 TABLET BY MOUTH DAILY   VENTOLIN HFA 108 (90 Base) MCG/ACT inhaler INHALE 2 PUFFS INTO THE LUNGS EVERY SIX HOURS AS NEEDED FOR WHEEZING OR SHORTNESS OF BREATH   vitamin C (ASCORBIC ACID) 500 MG tablet Take 500 mg by mouth daily.   Vitamin D, Cholecalciferol, 1000 UNITS TABS Take 2 tablets by mouth daily.   Zinc 50 MG CAPS Take by mouth.   zinc gluconate 50 MG tablet Take 50 mg by mouth daily as needed.   zoledronic acid (RECLAST) 5 MG/100ML SOLN injection Inject 5 mg into the vein. Once yearly starting June 2019 per Dr. Gabriel Carina   No facility-administered medications prior to visit.    Review of Systems  Constitutional:  Negative for appetite change, chills, fatigue and fever.  Respiratory:  Negative for chest tightness and shortness of breath.   Cardiovascular:  Negative for chest pain and palpitations.  Gastrointestinal:  Negative for abdominal pain, nausea and vomiting.  Neurological:  Negative for dizziness and weakness.      Objective    BP (!) 147/83 (BP Location: Left Arm, Patient Position: Sitting, Cuff Size: Normal)   Pulse 72   Temp 98.6 F (37 C) (Oral)   Resp 18   Wt 143 lb (64.9 kg)   SpO2 98% Comment: room air  BMI 26.16  kg/m  {Show previous vital signs (optional):23777}  Physical Exam  General appearance:  Well developed, well nourished female, cooperative and in no acute distress Head: Normocephalic, without obvious abnormality, atraumatic Respiratory: Respirations even and unlabored, normal respiratory rate Extremities: All extremities are intact.  Skin: Skin color, texture, turgor normal. No rashes seen  Psych: Appropriate mood and affect. Neurologic: Mental status: Alert, oriented to person, place, and time, thought content appropriate.     Assessment & Plan     1. Grief reaction   2. Situational stress Doing better with started second tablet of bupropion every day.   3. Hypothyroidism, unspecified type  - TSH  4. Hyperlipidemia, unspecified hyperlipidemia type Intolerant to statins, but doing well with ezetimibe.  - Lipid panel  5. Osteoporosis, unspecified osteoporosis type, unspecified pathological fracture  presence On parenteral therapy by Dr. Gabriel Carina  - DG Bone Density; Future  6. Prescription for Shingrix. Vaccine not administered in office.   - Zoster Vaccine Adjuvanted [facility]) injection; Inject 0.5 mLs into the muscle once for 1 dose. Repeat after 2 months  Dispense: 0.5 mL; Refill: 0      Future Appointments  Date Time Provider [place]  12/28/2020  1:15 PM [facility]  02/17/2021 10:00 AM [facility] SOCIAL WORK [facility] PEC  04/26/2021  3:00 PM [facility]-NURSE HEALTH ADVISOR [facility] PEC  06/28/2021  4:00 PM Dymphna Wadley, Kirstie Peri, MD [facility] PEC    The entirety of the information documented in the History of Present Illness, Review of Systems and Physical Exam were personally obtained by me. Portions of this information were initially documented by the CMA and reviewed by me for thoroughness and accuracy.     [name], MD  [facility] [phone] (phone) [phone] (fax)  [place]

## 2020-12-24 NOTE — Patient Instructions (Signed)
Please review the attached list of medications and notify my office if there are any errors.   Please go to the lab draw station in [address] on the second floor of [facility]  when you are fasting for 8 hours. Normal hours are 8:00am to 11:30am and 1:00pm to 4:00pm Monday through Friday     

## 2020-12-27 DIAGNOSIS — F4321 Adjustment disorder with depressed mood: Secondary | ICD-10-CM

## 2020-12-27 DIAGNOSIS — E785 Hyperlipidemia, unspecified: Secondary | ICD-10-CM | POA: Diagnosis not present

## 2020-12-27 DIAGNOSIS — E039 Hypothyroidism, unspecified: Secondary | ICD-10-CM | POA: Diagnosis not present

## 2020-12-27 DIAGNOSIS — R69 Illness, unspecified: Secondary | ICD-10-CM | POA: Diagnosis not present

## 2020-12-28 ENCOUNTER — Ambulatory Visit: Payer: Medicare HMO

## 2020-12-28 LAB — TSH: TSH: 2.86 u[IU]/mL (ref 0.450–4.500)

## 2020-12-28 LAB — LIPID PANEL
Chol/HDL Ratio: 4.2 ratio (ref 0.0–4.4)
Cholesterol, Total: 239 mg/dL — ABNORMAL HIGH (ref 100–199)
HDL: 57 mg/dL (ref >39)
LDL Chol Calc (NIH): 167 mg/dL — ABNORMAL HIGH (ref 0–99)
Triglycerides: 87 mg/dL (ref 0–149)
VLDL Cholesterol Cal: 15 mg/dL (ref 5–40)

## 2020-12-31 ENCOUNTER — Telehealth: Payer: Self-pay

## 2020-12-31 NOTE — Telephone Encounter (Deleted)
Pt advised.  She states welchol is going to be too expensive.  She states she still has fenofibrate at home and will try that again.

## 2020-12-31 NOTE — Telephone Encounter (Signed)
Pt advised.  She states welchol is going to be too expensive.  She states she still has fenofibrate at home and will try that again.

## 2020-12-31 NOTE — Telephone Encounter (Signed)
Cholesterol too high at 167. Recommend welchol 625 three tablets twice a day, can send in prescription #180 rf x 5 Rest of labs including thyroid functions are good.  Written by Birdie Sons, MD on 12/30/2020  1:51 PM EDT

## 2021-01-13 ENCOUNTER — Ambulatory Visit (INDEPENDENT_AMBULATORY_CARE_PROVIDER_SITE_OTHER): Payer: Medicare HMO | Admitting: *Deleted

## 2021-01-13 DIAGNOSIS — F439 Reaction to severe stress, unspecified: Secondary | ICD-10-CM

## 2021-01-13 DIAGNOSIS — F419 Anxiety disorder, unspecified: Secondary | ICD-10-CM

## 2021-01-13 DIAGNOSIS — F4321 Adjustment disorder with depressed mood: Secondary | ICD-10-CM

## 2021-01-13 NOTE — Chronic Care Management (AMB) (Signed)
Chronic Care Management    Clinical Social Work Note  01/13/2021 Name: Laura Love  Laura Love is a 80 y.o. year old female who is a primary care patient of Fisher, Kirstie Peri, MD. The CCM team was consulted to assist the patient with chronic disease management and/or care coordination needs related to: Grief Counseling.   Engaged with patient by telephone for follow up visit in response to provider referral for social work chronic care management and care coordination services.   Consent to Services:  The patient was given information about Chronic Care Management services, agreed to services, and gave verbal consent prior to initiation of services.  Please see initial visit note for detailed documentation.   Patient agreed to services and consent obtained.   Assessment: Review of patient past medical history, allergies, medications, and health status, including review of relevant consultants reports was performed today as part of a comprehensive evaluation and provision of chronic care management and care coordination services.     SDOH (Social Determinants of Health) assessments and interventions performed:    Advanced Directives Status: Not addressed in this encounter.  CCM Care Plan  Allergies  Allergen Reactions   Alendronate Nausea Only   Crestor [Rosuvastatin Calcium]    Duloxetine Nausea Only   Pentazocine Other (See Comments)    Outpatient Encounter Medications as of 01/13/2021  Medication Sig Note   acetaminophen (TYLENOL) 325 MG tablet Take 650 mg by mouth every 6 (six) hours as needed. 1 a day    Biotin 1000 MCG tablet Take 1,000 mcg by mouth daily.    budesonide-formoterol (SYMBICORT) 160-4.5 MCG/ACT inhaler 1-2 puffs twice daily as needed    buPROPion (WELLBUTRIN SR) 150 MG 12 hr tablet TAKE ONE TABLET TWICE DAILY    CALCIUM PO Take 600 mg by mouth 2 (two) times daily. + D3 800 units    dicyclomine (BENTYL) 10 MG capsule Take  1 capsule (10 mg total) by mouth 4 (four) times daily -  before meals and at bedtime. As needed for abdominal pain    diphenhydramine-acetaminophen (TYLENOL PM) 25-500 MG TABS tablet Take 1 tablet by mouth at bedtime.    ezetimibe (ZETIA) 10 MG tablet Take 1 tablet (10 mg total) by mouth daily.    ibuprofen (ADVIL,MOTRIN) 200 MG tablet Take 200 mg by mouth every 6 (six) hours as needed. 1 tablet daily    meclizine (ANTIVERT) 25 MG tablet Take 1 tablet (25 mg total) by mouth 3 (three) times daily as needed for dizziness.    Melatonin 3 MG TABS Take 1 tablet by mouth at bedtime.    sennosides-docusate sodium (SENOKOT-S) 8.6-50 MG tablet Take 2-3 tablets by mouth 2 (two) times daily. 3 tablets every morning, then 3-4 tablets in the evenings    SYNTHROID 100 MCG tablet TAKE 1 TABLET BY MOUTH DAILY    VENTOLIN HFA 108 (90 Base) MCG/ACT inhaler INHALE 2 PUFFS INTO THE LUNGS EVERY SIX HOURS AS NEEDED FOR WHEEZING OR SHORTNESS OF BREATH    vitamin C (ASCORBIC ACID) 500 MG tablet Take 500 mg by mouth daily. 04/20/2020: occasionally   Vitamin D, Cholecalciferol, 1000 UNITS TABS Take 2 tablets by mouth daily.    Zinc 50 MG CAPS Take by mouth.    zinc gluconate 50 MG tablet Take 50 mg by mouth daily as needed.    zoledronic acid (RECLAST) 5 MG/100ML SOLN injection Inject 5 mg into the vein. Once yearly starting June 2019 per Dr.  Solum    No facility-administered encounter medications on file as of 01/13/2021.    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   Cannot sleep 03/10/2007   Dysthymia 03/10/2007   Family history of breast cancer 03/10/2007    Conditions to be addressed/monitored: Depression; Mental Health Concerns   Care Plan : General Social Work (Adult)  Updates made by Vern Claude, LCSW since 01/13/2021 12:00 AM     Problem: Caregiver Stress      Goal: Caregiver Coping Optimized   Start Date: 06/24/2020  Recent Progress: On track  Priority: High  Note:   Current Barriers:  Limited social support Suicidal Ideation/Homicidal Ideation: No  Clinical Social Work Goal(s):  Over the next 90 days, patient will work with SW bi-weekly by telephone or in person to reduce or manage symptoms related to grief and loss  Interventions:  Continued follow up to assess for support needs regarding the loss of her spouse Normalized grief response to the loss of her spouse Reinforced plan for self care-provided requested resources for group therapy provided-[facility] [phone] Patient declined resources for individual counseling stating that group would be most beneficial Confirmed additional plans to contact [facility] for assistance as well Patient continues to acknowledge plan to allow time for healing during this time Verbalization of feelings encouraged, emotional support and reassurance provided Contact information for this social worker provided to patient in the event that patient has any further community resource needs  Patient Self Care Activities:  Performs ADL's independently Performs IADL's independently Calls provider office for new concerns or questions Ability for insight  Patient Coping Strengths:  Hopefulness Self Advocate Able to Communicate Effectively  Patient Self Care Deficits:  Knowledge deficit of community resources that can assist her spouse in the home            Follow Up Plan: Client will contact  this social worker with any additional community resource needs       [place], [place] Worker  [facility] [phone]

## 2021-01-13 NOTE — Patient Instructions (Signed)
Visit Information  PATIENT GOALS/PLAN OF CARE:  Care Plan : General Social Work (Adult)  Updates made by Vern Claude, LCSW since 01/13/2021 12:00 AM     Problem: Caregiver Stress      Goal: Caregiver Coping Optimized   Start Date: 06/24/2020  Recent Progress: On track  Priority: High  Note:   Current Barriers:  Limited social support Suicidal Ideation/Homicidal Ideation: No  Clinical Social Work Goal(s):  Over the next 90 days, patient will work with SW bi-weekly by telephone or in person to reduce or manage symptoms related to grief and loss  Interventions:  Continued follow up to assess for support needs regarding the loss of her spouse Normalized grief response to the loss of her spouse Reinforced plan for self care-provided requested resources for group therapy provided-[facility] [phone] Patient declined resources for individual counseling stating that group would be most beneficial Confirmed additional plans to contact [facility] for assistance as well Patient continues to acknowledge plan to allow time for healing during this time Verbalization of feelings encouraged, emotional support and reassurance provided Contact information for this social worker provided to patient in the event that patient has any further community resource needs  Patient Self Care Activities:  Performs ADL's independently Performs IADL's independently Calls provider office for new concerns or questions Ability for insight  Patient Coping Strengths:  Hopefulness Self Advocate Able to Communicate Effectively  Patient Self Care Deficits:  Knowledge deficit of community resources that can assist her spouse in the home          The patient verbalized understanding of instructions, educational materials, and care plan provided today and declined offer to receive copy of patient instructions, educational materials, and care plan.   No further follow up required:  patient to follow up with [facility] for group counseling-contact information provided   [employer], [place] Worker  [facility] [phone]

## 2021-01-26 DIAGNOSIS — F4321 Adjustment disorder with depressed mood: Secondary | ICD-10-CM

## 2021-02-17 ENCOUNTER — Telehealth: Payer: Medicare HMO

## 2021-05-02 ENCOUNTER — Telehealth: Payer: Self-pay | Admitting: Family Medicine

## 2021-05-02 NOTE — Telephone Encounter (Signed)
Pt is calling after seeing commercial for nexletol bempedoic aid- is wanting to discuss if Dr. Caryn Section think that pt could tolerate this medication. ?Pt is currently Zedia for cholesterol. ?Please advise  ?CB- [phone] ? ?Preferred Pharmacy- [facility]  ?

## 2021-05-02 NOTE — Telephone Encounter (Signed)
Please Review

## 2021-05-03 ENCOUNTER — Telehealth: Payer: Self-pay

## 2021-05-03 NOTE — Telephone Encounter (Signed)
Copied from [place] [phone]. Topic: General - Other ?>> May 03, 2021  9:46 AM Yvette Rack wrote: ?Reason for CRM: Pt called for update on whether her request for cholesterol medication was approved by Dr. Caryn Section. Pt requests call back ?

## 2021-05-04 NOTE — Telephone Encounter (Signed)
Patient advised. Patient verbalizes that she is really interested in trying this medication and would like to know what Dr. Caryn Section thinks once he looks it up. Patient states she saw it on 60 minutes television show and it is supposed to have less side effects that statins.  ? ?Also, patient wants to know if she needs to have a colonoscopy at her age. She was told that she needed to have a colonoscopy done annually due to abnormal findings. If she does need one she would like to have it done locally.  ?

## 2021-05-05 ENCOUNTER — Other Ambulatory Visit: Payer: Self-pay

## 2021-05-05 ENCOUNTER — Encounter: Payer: Self-pay | Admitting: Physician Assistant

## 2021-05-05 ENCOUNTER — Ambulatory Visit (INDEPENDENT_AMBULATORY_CARE_PROVIDER_SITE_OTHER): Payer: Medicare HMO | Admitting: Physician Assistant

## 2021-05-05 VITALS — BP 135/83 | HR 86 | Temp 98.4°F | Resp 16 | Wt 140.1 lb

## 2021-05-05 DIAGNOSIS — L89151 Pressure ulcer of sacral region, stage 1: Secondary | ICD-10-CM | POA: Diagnosis not present

## 2021-05-05 NOTE — Progress Notes (Signed)
?  ? ?I,Joseline E Rosas,acting as a scribe for [employer], PA-C.,have documented all relevant documentation on the behalf of [place], PA-C,as directed by  [employer], PA-C while in the presence of Anaira Seay E Briasia Flinders, PA-C.  ? ?Established patient visit ? ? ?Patient: Laura Love   DOB: 11/23/1940   81 y.o. Female  MRN: [id number] ?Visit Date: 05/05/2021 ? ?Today's healthcare provider: Dani Gobble Keeanna Villafranca, PA-C  ?Introduced myself to the patient as a Journalist, newspaper and provided education on APPs in clinical practice.  ? ? ?Chief Complaint  ?Patient presents with  ? Tailbone Pain  ? ?Subjective  ?  ?HPI  ?Patient here with concerns of tailbone pain. Report that it started yesterday. Pain is moderate and is a 4-5/10 with sitting.Some discomfort with movements and walking. Took IBU last night, reports that it helped some. Patient wit Osteoporosis. ? ?States it started yesterday ?She tried to feel the area in the shower and felt something ?States she was trying to clean out some closets over the past few days ?Was sitting on a oddly shaped stool so thinks that could be a factor ? ?States she hasn't been taking her Calcium for a few months because she forgot to refill this  ? ?Aggravating: sitting on it or touching it ?Alleviating: Nothing  ?Has not taken Ibuprofen this AM, said it seemed to help last night ? ?Patient also had questions concerning her hyperlipidemia regimen. States she heard about a medication for cholesterol on 60 minutes and would like to see if she can start it- Nexletol.  ? ? ? ? ?Medications: ?Outpatient Medications Prior to Visit  ?Medication Sig  ? acetaminophen (TYLENOL) 325 MG tablet Take 650 mg by mouth every 6 (six) hours as needed. 1 a day  ? Biotin 1000 MCG tablet Take 1,000 mcg by mouth daily.  ? budesonide-formoterol (SYMBICORT) 160-4.5 MCG/ACT inhaler 1-2 puffs twice daily as needed  ? buPROPion (WELLBUTRIN SR) 150 MG 12 hr tablet TAKE ONE TABLET TWICE DAILY  ? dicyclomine (BENTYL) 10 MG capsule Take  1 capsule (10 mg total) by mouth 4 (four) times daily -  before meals and at bedtime. As needed for abdominal pain  ? ezetimibe (ZETIA) 10 MG tablet Take 1 tablet (10 mg total) by mouth daily.  ? ibuprofen (ADVIL,MOTRIN) 200 MG tablet Take 200 mg by mouth every 6 (six) hours as needed. 1 tablet daily  ? Melatonin 3 MG TABS Take 1 tablet by mouth at bedtime.  ? sennosides-docusate sodium (SENOKOT-S) 8.6-50 MG tablet Take 2-3 tablets by mouth 2 (two) times daily. 3 tablets every morning, then 3-4 tablets in the evenings  ? SYNTHROID 100 MCG tablet TAKE 1 TABLET BY MOUTH DAILY  ? VENTOLIN HFA 108 (90 Base) MCG/ACT inhaler INHALE 2 PUFFS INTO THE LUNGS EVERY SIX HOURS AS NEEDED FOR WHEEZING OR SHORTNESS OF BREATH  ? vitamin C (ASCORBIC ACID) 500 MG tablet Take 500 mg by mouth daily.  ? Vitamin D, Cholecalciferol, 1000 UNITS TABS Take 2 tablets by mouth daily.  ? Zinc 50 MG CAPS Take by mouth.  ? zinc gluconate 50 MG tablet Take 50 mg by mouth daily as needed.  ? zoledronic acid (RECLAST) 5 MG/100ML SOLN injection Inject 5 mg into the vein. Once yearly starting June 2019 per Dr. Gabriel Carina  ? CALCIUM PO Take 600 mg by mouth 2 (two) times daily. + D3 800 units (Patient not taking: Reported on 05/05/2021)  ? diphenhydramine-acetaminophen (TYLENOL PM) 25-500 MG TABS  tablet Take 1 tablet by mouth at bedtime. (Patient not taking: Reported on 05/05/2021)  ? meclizine (ANTIVERT) 25 MG tablet Take 1 tablet (25 mg total) by mouth 3 (three) times daily as needed for dizziness. (Patient not taking: Reported on 05/05/2021)  ? ?No facility-administered medications prior to visit.  ? ? ?Review of Systems  ?Constitutional:  Negative for fever.  ?Musculoskeletal:  Positive for back pain (Lower back/ tailbone pain).  ?Neurological:  Negative for dizziness, light-headedness and headaches.  ? ? ? ? ?  Objective  ?  ?BP 135/83 (BP Location: Left Arm, Patient Position: Standing, Cuff Size: Normal)   Pulse 86   Temp 98.4 ?F (36.9 ?C) (Oral)   Resp 16    Wt 140 lb 1.6 oz (63.5 kg)   BMI 25.62 kg/m?  ? ? ?Physical Exam ?Vitals reviewed.  ?Constitutional:   ?   General: She is awake.  ?   Appearance: Normal appearance. She is well-developed, well-groomed and normal weight.  ?HENT:  ?   Head: Normocephalic and atraumatic.  ?Skin: ?   General: Skin is warm and dry.  ?   Findings: Erythema and wound present.  ? ?    ?Neurological:  ?   Mental Status: She is alert and oriented to person, place, and time.  ?   GCS: GCS eye subscore is 4. GCS verbal subscore is 5. GCS motor subscore is 6.  ?Psychiatric:     ?   Attention and Perception: Attention normal.     ?   Mood and Affect: Mood normal.     ?   Speech: Speech normal.     ?   Behavior: Behavior normal. Behavior is cooperative.     ?   Thought Content: Thought content normal.  ?  ?  ?Media Information ?Document Information ? ?Photos  ?  ?05/05/2021 11:25  ?Attached To:  ?Office Visit on 05/05/21 with Bristyl Mclees, Dani Gobble, PA-C  ? ?Source Information ? ?Autym Siess, Dani Gobble, PA-C  [facility]  ? ? ?No results found for any visits on 05/05/21. ? Assessment & Plan  ?  ? ?Problem List Items Addressed This Visit   ?None ?Visit Diagnoses   ? ? Pressure injury of sacral region, stage 1    -  Primary ? ?Acute, new problem ?Suspect this is likely superficial skin irritation, potentially early stage pressure injury due to patient's habits of sitting for prolonged amounts of time and recent increase of seated activity ?Discussed proper cleansing techniques and reviewed topical treatments- patient would prefer to use Desitin to assist with maintaining skin barrier at this time ?Discussed reducing time spent in same positions to help alleviate stress to these areas.  ?Discussed that this could have multiple potential etiologies to include, pressure injury, pilonidal cyst  ?Recommend follow up in two weeks for assessment and if not resolving may need to refer to Gen Surgery for evaluation in case of pilonidal disease.  ?   ? ?   ? ? ? ?Return in about 2 weeks (around 05/19/2021) for pressure injury . ? ? ?I, Anabella Capshaw E Huckleberry Martinson, PA-C, have reviewed all documentation for this visit. The documentation on 05/05/21 for the exam, diagnosis, procedures, and orders are all accurate and complete. ? ? ?Smt Lokey, Glennie [place] MPH ?[place] ?[facility] ? ? ? ?

## 2021-05-05 NOTE — Patient Instructions (Addendum)
Keep the area clean and dry  ?Use a gentle soap and warm water for cleansing ?You can use your Desitin to help with irritation ? ? ? ?I recommend you do not stay in one position or seated for extended periods to reduce pressure on the area ? ?Please follow up for this in about 2 weeks or sooner if you feel it is getting worse.  ?

## 2021-05-18 ENCOUNTER — Other Ambulatory Visit: Payer: Self-pay

## 2021-05-18 ENCOUNTER — Encounter: Payer: Self-pay | Admitting: Family Medicine

## 2021-05-18 ENCOUNTER — Ambulatory Visit (INDEPENDENT_AMBULATORY_CARE_PROVIDER_SITE_OTHER): Payer: Medicare HMO | Admitting: Family Medicine

## 2021-05-18 VITALS — BP 144/71 | HR 71 | Temp 97.8°F | Resp 14 | Wt 141.0 lb

## 2021-05-18 DIAGNOSIS — H6122 Impacted cerumen, left ear: Secondary | ICD-10-CM | POA: Diagnosis not present

## 2021-05-18 DIAGNOSIS — E785 Hyperlipidemia, unspecified: Secondary | ICD-10-CM

## 2021-05-18 DIAGNOSIS — Z86010 Personal history of colonic polyps: Secondary | ICD-10-CM | POA: Diagnosis not present

## 2021-05-18 NOTE — Progress Notes (Signed)
?  ? ?I,Roshena L Chambers,acting as a scribe for [name], MD.,have documented all relevant documentation on the behalf of [name], MD,as directed by  [name], MD while in the presence of [name], MD.  ? ? ?Established patient visit ? ? ?Patient: Laura Love   DOB: [birth year]   81 y.o. Female  MRN: [id number] ?Visit Date: 05/18/2021 ? ?Today's healthcare provider: [name], MD  ? ?Chief Complaint  ?Patient presents with  ? Follow-up  ? Pressure injury  ? Hearing Problem  ? ?Subjective  ?  ?HPI  ?Follow up for pressure injury of sacral region; stage 1: ? ?The patient was last seen for this on 05/05/2021 (seen by Junie Panning Mecum, PA-C).   ?During that visit,  proper cleansing techniques was discussed and reviewed topical treatments- patient preferred to use Desitin to assist with maintaining skin barrier. Discussed reducing time spent in same positions to help alleviate stress to these areas. Recommend follow up in two weeks for assessment and if not resolving may need to refer to Gen Surgery for evaluation in case of pilonidal disease. ? ?She reports good compliance with treatment. ?She feels that condition is Improved. ?She is not having side effects.  ? ?-----------------------------------------------------------------------------------------  ?Decrease hearing: ?Patient reports a decrease of hearing in both ears a little over 1 month ago.  Patient tried using OTC ear wax removal aid, which worsened symptoms. Patient reports symptoms eventually improved and has now resolved. ? ?She is also interested in trying Bempedoic acid for high cholesterol. She is intolerant to multiple statins, but is tolerating ezetimibe fairly well, although lipids not to goal when last checked.  ?Lab Results  ?Component Value Date  ? CHOL 239 (H) 12/27/2020  ? HDL 57 12/27/2020  ? LDLCALC 167 (H) 12/27/2020  ? LDLDIRECT 159.3 10/24/2010  ? TRIG 87 12/27/2020  ? CHOLHDL 4.2 12/27/2020  ?  ?Medications: ?Outpatient  Medications Prior to Visit  ?Medication Sig  ? acetaminophen (TYLENOL) 325 MG tablet Take 650 mg by mouth every 6 (six) hours as needed. 1 a day  ? Biotin 1000 MCG tablet Take 1,000 mcg by mouth daily.  ? budesonide-formoterol (SYMBICORT) 160-4.5 MCG/ACT inhaler 1-2 puffs twice daily as needed  ? buPROPion (WELLBUTRIN SR) 150 MG 12 hr tablet TAKE ONE TABLET TWICE DAILY  ? CALCIUM PO Take 600 mg by mouth 2 (two) times daily. + D3 800 units  ? dicyclomine (BENTYL) 10 MG capsule Take 1 capsule (10 mg total) by mouth 4 (four) times daily -  before meals and at bedtime. As needed for abdominal pain  ? ezetimibe (ZETIA) 10 MG tablet Take 1 tablet (10 mg total) by mouth daily.  ? ibuprofen (ADVIL,MOTRIN) 200 MG tablet Take 200 mg by mouth every 6 (six) hours as needed. 1 tablet daily  ? Melatonin 3 MG TABS Take 1 tablet by mouth at bedtime.  ? sennosides-docusate sodium (SENOKOT-S) 8.6-50 MG tablet Take 2-3 tablets by mouth 2 (two) times daily. 3 tablets every morning, then 3-4 tablets in the evenings  ? SYNTHROID 100 MCG tablet TAKE 1 TABLET BY MOUTH DAILY  ? VENTOLIN HFA 108 (90 Base) MCG/ACT inhaler INHALE 2 PUFFS INTO THE LUNGS EVERY SIX HOURS AS NEEDED FOR WHEEZING OR SHORTNESS OF BREATH  ? vitamin C (ASCORBIC ACID) 500 MG tablet Take 500 mg by mouth daily.  ? Vitamin D, Cholecalciferol, 1000 UNITS TABS Take 2 tablets by mouth daily.  ? Zinc 50 MG CAPS Take by mouth.  ? zinc  gluconate 50 MG tablet Take 50 mg by mouth daily as needed.  ? zoledronic acid (RECLAST) 5 MG/100ML SOLN injection Inject 5 mg into the vein. Once yearly starting June 2019 per Dr. Gabriel Carina  ? ?No facility-administered medications prior to visit.  ? ? ?Review of Systems  ?Constitutional:  Negative for appetite change, chills, fatigue and fever.  ?Respiratory:  Negative for chest tightness and shortness of breath.   ?Cardiovascular:  Negative for chest pain and palpitations.  ?Gastrointestinal:  Negative for abdominal pain, nausea and vomiting.   ?Neurological:  Negative for dizziness and weakness.  ? ? ?  Objective  ?  ?BP (!) 144/71 (BP Location: Right Arm, Patient Position: Sitting, Cuff Size: Normal)   Pulse 71   Temp 97.8 ?F (36.6 ?C) (Oral)   Resp 14   Wt 141 lb (64 kg)   SpO2 100% Comment: room air  BMI 25.79 kg/m?  ? ? ?Today's Vitals  ? 05/18/21 1049 05/18/21 1054  ?BP: 140/60 (!) 144/71  ?Pulse: 71   ?Resp: 14   ?Temp: 97.8 ?F (36.6 ?C)   ?TempSrc: Oral   ?SpO2: 100%   ?Weight: 141 lb (64 kg)   ? ?Body mass index is 25.79 kg/m?.  ? ? ?Physical Exam  ?Sacral wound completely healed. ?Narrow ear canals. Left canal obstructed with cerumen, right with excessive cerumen, but not obstructed.  ? ? ? Assessment & Plan  ?  ? ?1. Hearing loss of left ear due to cerumen impaction ?After soaking with Debrox, ear canal was irrigated with water until clear. Patient tolerated procedure well.  ?Advised she could return to irrigate right canal if it becomes more bothersome.  ? ?2. Hyperlipidemia, unspecified hyperlipidemia type ?Intolerant to statins, Welchol and Colestid too expensive. Lipids not at goal on exetemibe. Counseled that bempedoic acid can have some serious side effects, especially for people over 60, and would likely either not be covered by insurance or would have non-preferred copay. I wouldn't recommend it unless her lipids are very very high. Otherwise encourage healthy diet and regular exercise.  ? ?- Lipid panel ?  ?3. History of adenomatous polyp of colon ? ?She is due for colonoscopy but she would like to transfer to local GI. Discussed local GI practices and she is to call back for referral which she decides who to dee.   ?   ? ?The entirety of the information documented in the History of Present Illness, Review of Systems and Physical Exam were personally obtained by me. Portions of this information were initially documented by the CMA and reviewed by me for thoroughness and accuracy.   ? ? ?[name], MD  ?[facility] ?[phone] (phone) ?[phone] (fax) ? ?[facility]  ?

## 2021-05-20 ENCOUNTER — Telehealth: Payer: Self-pay | Admitting: Family Medicine

## 2021-05-20 DIAGNOSIS — M1710 Unilateral primary osteoarthritis, unspecified knee: Secondary | ICD-10-CM

## 2021-05-20 NOTE — Telephone Encounter (Signed)
Not sure if ok to place referral. Referral pended. ?

## 2021-05-20 NOTE — Telephone Encounter (Signed)
Copied from [place] [id number]. Topic: Referral - Request for Referral ?>> May 20, 2021  9:32 AM Leward Quan A wrote: ?Has patient seen PCP for this complaint? Yes.   ?*If NO, is insurance requiring patient see PCP for this issue before PCP can refer them? ?Referral for which specialty: Orthopedic  ?Preferred provider/office: [place] [facility]  ?Reason for referral: Gel injection in the knee ?

## 2021-05-23 NOTE — Telephone Encounter (Signed)
Pt states she is ready to do her colonoscopy and she wants to go to [facility]. ? ?Could Dr Caryn Section put in the referral? ?

## 2021-05-24 ENCOUNTER — Other Ambulatory Visit: Payer: Self-pay

## 2021-05-24 DIAGNOSIS — E785 Hyperlipidemia, unspecified: Secondary | ICD-10-CM | POA: Diagnosis not present

## 2021-05-24 DIAGNOSIS — Z1211 Encounter for screening for malignant neoplasm of colon: Secondary | ICD-10-CM

## 2021-05-24 NOTE — Telephone Encounter (Signed)
Order has been placed.

## 2021-05-25 ENCOUNTER — Telehealth: Payer: Self-pay

## 2021-05-25 LAB — LIPID PANEL
Chol/HDL Ratio: 3.5 ratio (ref 0.0–4.4)
Cholesterol, Total: 230 mg/dL — ABNORMAL HIGH (ref 100–199)
HDL: 65 mg/dL (ref >39)
LDL Chol Calc (NIH): 138 mg/dL — ABNORMAL HIGH (ref 0–99)
Triglycerides: 152 mg/dL — ABNORMAL HIGH (ref 0–149)
VLDL Cholesterol Cal: 27 mg/dL (ref 5–40)

## 2021-05-25 NOTE — Telephone Encounter (Signed)
CALLED PATIENT NO ANSWER LEFT VOICEMAIL FOR A CALL BACK ? ?

## 2021-05-26 NOTE — Progress Notes (Signed)
Gastroenterology Pre-Procedure Review ?Patient is 47 so made in person appointment  ?Request Date: na ?Requesting Physician: Dr. Lauro Regulus ? ?PATIENT REVIEW QUESTIONS: The patient responded to the following health history questions as indicated:   ? ?1. Are you having any GI issues? no ?2. Do you have a personal history of Polyps? YES LAST COLONOSCOPY  ?3. Do you have a family history of Colon Cancer or Polyps? yes (POLYPS) ?4. Diabetes Mellitus? no ?5. Joint replacements in the past 12 months?no ?6. Major health problems in the past 3 months?no ?7. Any artificial heart valves, MVP, or defibrillator?no ?   ?MEDICATIONS & ALLERGIES:    ?Patient reports the following regarding taking any anticoagulation/antiplatelet therapy:   ?Plavix, Coumadin, Eliquis, Xarelto, Lovenox, Pradaxa, Brilinta, or Effient? no ?Aspirin? no ? ?Patient confirms/reports the following medications:  ?Current Outpatient Medications  ?Medication Sig Dispense Refill  ? acetaminophen (TYLENOL) 325 MG tablet Take 650 mg by mouth every 6 (six) hours as needed. 1 a day    ? Biotin 1000 MCG tablet Take 1,000 mcg by mouth daily.    ? budesonide-formoterol (SYMBICORT) 160-4.5 MCG/ACT inhaler 1-2 puffs twice daily as needed 10.2 g 5  ? buPROPion (WELLBUTRIN SR) 150 MG 12 hr tablet TAKE ONE TABLET TWICE DAILY 180 tablet 1  ? CALCIUM PO Take 600 mg by mouth 2 (two) times daily. + D3 800 units    ? dicyclomine (BENTYL) 10 MG capsule Take 1 capsule (10 mg total) by mouth 4 (four) times daily -  before meals and at bedtime. As needed for abdominal pain 100 capsule 3  ? ezetimibe (ZETIA) 10 MG tablet Take 1 tablet (10 mg total) by mouth daily. 90 tablet 4  ? ibuprofen (ADVIL,MOTRIN) 200 MG tablet Take 200 mg by mouth every 6 (six) hours as needed. 1 tablet daily    ? Melatonin 3 MG TABS Take 1 tablet by mouth at bedtime.    ? sennosides-docusate sodium (SENOKOT-S) 8.6-50 MG tablet Take 2-3 tablets by mouth 2 (two) times daily. 3 tablets every morning, then 3-4 tablets  in the evenings    ? SYNTHROID 100 MCG tablet TAKE 1 TABLET BY MOUTH DAILY 90 tablet 3  ? VENTOLIN HFA 108 (90 Base) MCG/ACT inhaler INHALE 2 PUFFS INTO THE LUNGS EVERY SIX HOURS AS NEEDED FOR WHEEZING OR SHORTNESS OF BREATH 18 g 3  ? vitamin C (ASCORBIC ACID) 500 MG tablet Take 500 mg by mouth daily.    ? Vitamin D, Cholecalciferol, 1000 UNITS TABS Take 2 tablets by mouth daily.    ? Zinc 50 MG CAPS Take by mouth.    ? zinc gluconate 50 MG tablet Take 50 mg by mouth daily as needed.    ? zoledronic acid (RECLAST) 5 MG/100ML SOLN injection Inject 5 mg into the vein. Once yearly starting June 2019 per Dr. Gabriel Carina    ? ?No current facility-administered medications for this visit.  ? ? ?Patient confirms/reports the following allergies:  ?Allergies  ?Allergen Reactions  ? Alendronate Nausea Only  ? Crestor [Rosuvastatin Calcium]   ? Duloxetine Nausea Only  ? Pentazocine Other (See Comments)  ? ? ?Orders Placed This Encounter  ?Procedures  ? Ambulatory referral to Gastroenterology  ?  Referral Priority:   Routine  ?  Referral Type:   Consultation  ?  Referral Reason:   Specialty Services Required  ?  Number of Visits Requested:   1  ? ? ?AUTHORIZATION INFORMATION ?Primary Insurance: ?1D#: ?Group #: ? ?Secondary Insurance: ?1D#: ?Group #: ? ?  SCHEDULE INFORMATION: ?Date: na ?Time: ?Location:na ? ? ?

## 2021-05-26 NOTE — Telephone Encounter (Signed)
error 

## 2021-05-31 ENCOUNTER — Other Ambulatory Visit: Payer: Medicare HMO

## 2021-06-02 ENCOUNTER — Ambulatory Visit
Admission: RE | Admit: 2021-06-02 | Discharge: 2021-06-02 | Disposition: A | Discharge status: Home / Self Care | Payer: Medicare HMO | Source: Ambulatory Visit | Attending: Family Medicine | Admitting: Family Medicine

## 2021-06-02 DIAGNOSIS — M85832 Other specified disorders of bone density and structure, left forearm: Secondary | ICD-10-CM | POA: Diagnosis not present

## 2021-06-02 DIAGNOSIS — M810 Age-related osteoporosis without current pathological fracture: Secondary | ICD-10-CM

## 2021-06-02 DIAGNOSIS — Z780 Asymptomatic menopausal state: Secondary | ICD-10-CM | POA: Diagnosis not present

## 2021-06-10 DIAGNOSIS — M1711 Unilateral primary osteoarthritis, right knee: Secondary | ICD-10-CM | POA: Diagnosis not present

## 2021-06-13 ENCOUNTER — Other Ambulatory Visit: Payer: Self-pay | Admitting: Family Medicine

## 2021-06-13 DIAGNOSIS — E785 Hyperlipidemia, unspecified: Secondary | ICD-10-CM

## 2021-06-16 ENCOUNTER — Telehealth: Payer: Self-pay | Admitting: Family Medicine

## 2021-06-16 ENCOUNTER — Ambulatory Visit: Payer: Self-pay | Admitting: *Deleted

## 2021-06-16 NOTE — Telephone Encounter (Signed)
Patient called to request medication refill for medication not on med list per agent. Upon transfer of call, call disconnected. NT attempted to call patient back and rang multiple times no answer, recording states unable to complete call at this time try call later. Unable to leave message. Per agent medication refill is for abdominal pain ,hyoscyamine sulfate. Please advise  ?

## 2021-06-16 NOTE — Telephone Encounter (Signed)
Pt called and asked for a refill on an old medication for her stomach pain Hyoscyamine sulfate 0.'125MG'$  SL tabs / pt disconnected while transferring to NT /  ?

## 2021-06-22 DIAGNOSIS — M1711 Unilateral primary osteoarthritis, right knee: Secondary | ICD-10-CM | POA: Diagnosis not present

## 2021-06-28 ENCOUNTER — Ambulatory Visit (INDEPENDENT_AMBULATORY_CARE_PROVIDER_SITE_OTHER): Payer: Medicare HMO | Admitting: Family Medicine

## 2021-06-28 ENCOUNTER — Ambulatory Visit: Payer: Medicare HMO | Admitting: Family Medicine

## 2021-06-28 ENCOUNTER — Encounter: Payer: Self-pay | Admitting: Family Medicine

## 2021-06-28 VITALS — BP 153/65 | HR 71 | Temp 97.8°F | Resp 16 | Wt 140.2 lb

## 2021-06-28 DIAGNOSIS — E039 Hypothyroidism, unspecified: Secondary | ICD-10-CM | POA: Diagnosis not present

## 2021-06-28 DIAGNOSIS — K588 Other irritable bowel syndrome: Secondary | ICD-10-CM | POA: Diagnosis not present

## 2021-06-28 DIAGNOSIS — J301 Allergic rhinitis due to pollen: Secondary | ICD-10-CM

## 2021-06-28 DIAGNOSIS — M810 Age-related osteoporosis without current pathological fracture: Secondary | ICD-10-CM

## 2021-06-28 DIAGNOSIS — H6121 Impacted cerumen, right ear: Secondary | ICD-10-CM

## 2021-06-28 MED ORDER — HYOSCYAMINE SULFATE 0.125 MG PO TBDP: 0.1250 mg | ORAL_TABLET | ORAL | 3 refills | Status: DC | PRN

## 2021-06-28 NOTE — Progress Notes (Signed)
I,Joseline E Rosas,acting as a scribe for [name], MD.,have documented all relevant documentation on the behalf of [name], MD,as directed by  [name], MD while in the presence of [name], MD.   Established patient visit   Patient: Laura Love   DOB: 01-31-1941   81 y.o. Female  MRN: [id number] Visit Date: 06/28/2021  Today's healthcare provider: [name], MD   Chief Complaint  Patient presents with   follow-up Thyroid   Subjective    HPI Patient here for follow up and to go over Bone Density results. Hypothyroid, follow-up  Lab Results  Component Value Date   TSH 2.860 12/27/2020   TSH 2.230 05/06/2020   TSH 2.360 06/10/2019    Wt Readings from Last 3 Encounters:  06/28/21 140 lb 3.2 oz (63.6 kg)  05/18/21 141 lb (64 kg)  05/05/21 140 lb 1.6 oz (63.5 kg)    She was last seen for hypothyroid 6 months ago.  Management since that visit includes Continue current medication. She reports excellent compliance with treatment. She is not having side effects.   Symptoms: No change in energy level No constipation  No diarrhea No heat / cold intolerance  No nervousness No palpitations  No weight changes    -----------------------------------------------------------------------------------------  She is also here to follow up on BMD recently done showing significant improvement in BMD of hip. Has been on Prolia injections which she is tolerating well.   She also states her right ear feels clogged up. Has history of cerumen impactions on several occasions requiring ear irrigation.   She has also had some wheezing and upper chest congestion the last day or so and wants to her lungs listened to.   Medications: Outpatient Medications Prior to Visit  Medication Sig   acetaminophen (TYLENOL) 325 MG tablet Take 650 mg by mouth every 6 (six) hours as needed. 1 a day   Biotin 1000 MCG tablet Take 1,000 mcg by mouth daily.   budesonide-formoterol  (SYMBICORT) 160-4.5 MCG/ACT inhaler 1-2 puffs twice daily as needed   buPROPion (WELLBUTRIN SR) 150 MG 12 hr tablet TAKE ONE TABLET TWICE DAILY   CALCIUM PO Take 600 mg by mouth 2 (two) times daily. + D3 800 units   dicyclomine (BENTYL) 10 MG capsule Take 1 capsule (10 mg total) by mouth 4 (four) times daily -  before meals and at bedtime. As needed for abdominal pain   ezetimibe (ZETIA) 10 MG tablet TAKE ONE TABLET EVERY DAY   ibuprofen (ADVIL,MOTRIN) 200 MG tablet Take 200 mg by mouth every 6 (six) hours as needed. 1 tablet daily   Melatonin 3 MG TABS Take 1 tablet by mouth at bedtime.   sennosides-docusate sodium (SENOKOT-S) 8.6-50 MG tablet Take 2-3 tablets by mouth 2 (two) times daily. 3 tablets every morning, then 3-4 tablets in the evenings   SYNTHROID 100 MCG tablet TAKE 1 TABLET BY MOUTH DAILY   VENTOLIN HFA 108 (90 Base) MCG/ACT inhaler INHALE 2 PUFFS INTO THE LUNGS EVERY SIX HOURS AS NEEDED FOR WHEEZING OR SHORTNESS OF BREATH   vitamin C (ASCORBIC ACID) 500 MG tablet Take 500 mg by mouth daily.   Vitamin D, Cholecalciferol, 1000 UNITS TABS Take 2 tablets by mouth daily.   Zinc 50 MG CAPS Take by mouth.   zinc gluconate 50 MG tablet Take 50 mg by mouth daily as needed.   zoledronic acid (RECLAST) 5 MG/100ML SOLN injection Inject 5 mg into the vein. Once yearly starting June  2019 per Dr. Gabriel Carina   No facility-administered medications prior to visit.    Review of Systems     Objective    BP (!) 153/65 (BP Location: Right Arm, Patient Position: Sitting, Cuff Size: Normal)   Pulse 71   Temp 97.8 F (36.6 C) (Oral)   Resp 16   Wt 140 lb 3.2 oz (63.6 kg)   BMI 25.64 kg/m    Physical Exam   General Appearance:     Well developed, well nourished female, alert, cooperative, in no acute distress  HENT:   Left ear canal clear with normal TM. Right canal obstructed by cerumen.   Eyes:    PERRL, conjunctiva/corneas clear, EOM's intact       Lungs:     Clear to auscultation  bilaterally, respirations unlabored  Heart:    Normal heart rate. Normal rhythm. No murmurs, rubs, or gallops.    Neurologic:   Awake, alert, oriented x 3. No apparent focal neurological           defect.         Assessment & Plan     1. Impacted cerumen of right ear After soaking with Debrox, ear canal was irrigated with water until clear. Patient tolerated procedure well.    2. Seasonal allergic rhinitis due to pollen Likely cause of her upper chest congestion. Pulmonary exam is completely normal today.   3. Hypothyroidism, unspecified type Clinically euthyroid with normal tsh last checked in October.   4. Osteoporosis, unspecified osteoporosis type, unspecified pathological fracture presence Excellent response to Prolia. Counseled on most recent BMD results. Is to continue follow up with endocrinology as scheduled.   5. Other irritable bowel syndrome She had been taking hyoscyamine that had been prescribed to her husband who is now  deceased.  - hyoscyamine (ANASPAZ) 0.125 MG TBDP disintergrating tablet; Place 1 tablet (0.125 mg total) under the tongue every 4 (four) hours as needed.  Dispense: 30 tablet; Refill: 3      The entirety of the information documented in the History of Present Illness, Review of Systems and Physical Exam were personally obtained by me. Portions of this information were initially documented by the CMA and reviewed by me for thoroughness and accuracy.     [name], MD  [facility] [phone] (phone) [phone] (fax)  [place]

## 2021-06-29 DIAGNOSIS — M1711 Unilateral primary osteoarthritis, right knee: Secondary | ICD-10-CM | POA: Diagnosis not present

## 2021-06-30 ENCOUNTER — Ambulatory Visit: Payer: Self-pay | Admitting: *Deleted

## 2021-06-30 ENCOUNTER — Telehealth (INDEPENDENT_AMBULATORY_CARE_PROVIDER_SITE_OTHER): Payer: Medicare HMO | Admitting: Physician Assistant

## 2021-06-30 ENCOUNTER — Encounter: Payer: Self-pay | Admitting: Physician Assistant

## 2021-06-30 VITALS — BP 128/69 | HR 75 | Temp 98.1°F | Wt 135.0 lb

## 2021-06-30 DIAGNOSIS — J069 Acute upper respiratory infection, unspecified: Secondary | ICD-10-CM

## 2021-06-30 DIAGNOSIS — R062 Wheezing: Secondary | ICD-10-CM | POA: Diagnosis not present

## 2021-06-30 MED ORDER — BENZONATATE 100 MG PO CAPS: 100.0000 mg | ORAL_CAPSULE | Freq: Two times a day (BID) | ORAL | 0 refills | Status: DC | PRN

## 2021-06-30 MED ORDER — ALBUTEROL SULFATE HFA 108 (90 BASE) MCG/ACT IN AERS: 2.0000 | INHALATION_SPRAY | Freq: Four times a day (QID) | RESPIRATORY_TRACT | 1 refills | Status: AC | PRN

## 2021-06-30 NOTE — Telephone Encounter (Signed)
?  Chief Complaint: increasing cough/chest congestion, wheezing ?Symptoms: cough, congestion, wheezing ?Frequency: Since Sunday ?Pertinent Negatives: Patient denies SOB, chest pain ?Disposition: '[]'$ ED /'[]'$ Urgent Care (no appt availability in office) / '[x]'$ Appointment(In office/virtual)/ '[]'$  [place] [facility]/ '[]'$ Home Care/ '[]'$ Refused Recommended Disposition /'[]'$ [place] [facility]/ '[]'$  Follow-up with PCP ?Additional Notes: Patient was seen in office with PCP 06/28/21- no better  ?

## 2021-06-30 NOTE — Progress Notes (Signed)
? ? ?I,Joseline E Rosas,acting as a scribe for [employer], PA-C.,have documented all relevant documentation on the behalf of [place], PA-C,as directed by  [employer], PA-C while in the presence of Nain Rudd E Lataria Courser, PA-C.  ? ?MyChart Video Visit ? ? ? ?Virtual Visit via Video Note  ? ?This visit type was conducted due to national recommendations for restrictions regarding the COVID-19 Pandemic (e.g. social distancing) in an effort to limit this patient's exposure and mitigate transmission in our community. This patient is at least at moderate risk for complications without adequate follow up. This format is felt to be most appropriate for this patient at this time. Physical exam was limited by quality of the video and audio technology used for the visit.  ? ?Patient location: at home in [place], [place] ?Provider location: [facility] [place], [place], [place]  ? ?I discussed the limitations of evaluation and management by telemedicine and the availability of in person appointments. The patient expressed understanding and agreed to proceed. ? ?Patient: Laura Love   DOB: December 28, 1940   81 y.o. Female  MRN: [id number] ?Visit Date: 06/30/2021 ? ?Today's healthcare provider: Dani Gobble Deannah Rossi, PA-C  ?Introduced myself to the patient as a Journalist, newspaper and provided education on APPs in clinical practice.  ? ? ?Chief Complaint  ?Patient presents with  ? Cough  ? ?Subjective  ?  ?Cough ?This is a new problem. The current episode started in the past 7 days (Started Monday night). The problem has been gradually worsening. The problem occurs constantly. The cough is Non-productive. Associated symptoms include myalgias, nasal congestion, postnasal drip, rhinorrhea, a sore throat, shortness of breath and wheezing. Pertinent negatives include no chest pain, chills, ear pain, fever or headaches. Associated symptoms comments: fatigue. Nothing aggravates the symptoms. Treatments tried: Vitamin C,Zinc, Mucinex and chicken soup. The  treatment provided no relief.   ? ? ?States she came in Tuesday for physical  ?States it has become consistently worse since Monday night/ Tuesday ?Reports she has tried Vitamin C, Mucinex, zinc and chicken soup without relief  ?Reports she has been wheezing- she has not been using inhalers as they were prescribed almost 6 years ago ? ?Symptoms: nonproductive cough,  ? ?Medications: ?Outpatient Medications Prior to Visit  ?Medication Sig  ? acetaminophen (TYLENOL) 325 MG tablet Take 650 mg by mouth every 6 (six) hours as needed. 1 a day  ? Biotin 1000 MCG tablet Take 1,000 mcg by mouth daily.  ? buPROPion (WELLBUTRIN SR) 150 MG 12 hr tablet TAKE ONE TABLET TWICE DAILY  ? CALCIUM PO Take 600 mg by mouth 2 (two) times daily. + D3 800 units  ? ezetimibe (ZETIA) 10 MG tablet TAKE ONE TABLET EVERY DAY  ? hyoscyamine (ANASPAZ) 0.125 MG TBDP disintergrating tablet Place 1 tablet (0.125 mg total) under the tongue every 4 (four) hours as needed.  ? ibuprofen (ADVIL,MOTRIN) 200 MG tablet Take 200 mg by mouth every 6 (six) hours as needed. 1 tablet daily  ? Melatonin 3 MG TABS Take 1 tablet by mouth at bedtime.  ? SYNTHROID 100 MCG tablet TAKE 1 TABLET BY MOUTH DAILY  ? vitamin C (ASCORBIC ACID) 500 MG tablet Take 500 mg by mouth daily.  ? Vitamin D, Cholecalciferol, 1000 UNITS TABS Take 2 tablets by mouth daily.  ? zinc gluconate 50 MG tablet Take 50 mg by mouth daily as needed.  ? zoledronic acid (RECLAST) 5 MG/100ML SOLN injection Inject 5 mg into the vein. Once yearly  starting June 2019 per Dr. Gabriel Carina  ? budesonide-formoterol (SYMBICORT) 160-4.5 MCG/ACT inhaler 1-2 puffs twice daily as needed (Patient not taking: Reported on 06/30/2021)  ? dicyclomine (BENTYL) 10 MG capsule Take 1 capsule (10 mg total) by mouth 4 (four) times daily -  before meals and at bedtime. As needed for abdominal pain (Patient not taking: Reported on 06/30/2021)  ? sennosides-docusate sodium (SENOKOT-S) 8.6-50 MG tablet Take 2-3 tablets by mouth 2 (two)  times daily. 3 tablets every morning, then 3-4 tablets in the evenings  ? VENTOLIN HFA 108 (90 Base) MCG/ACT inhaler INHALE 2 PUFFS INTO THE LUNGS EVERY SIX HOURS AS NEEDED FOR WHEEZING OR SHORTNESS OF BREATH (Patient not taking: Reported on 06/30/2021)  ? Zinc 50 MG CAPS Take by mouth.  ? ?No facility-administered medications prior to visit.  ? ? ?Review of Systems  ?Constitutional:  Negative for chills, diaphoresis and fever.  ?HENT:  Positive for postnasal drip, rhinorrhea, sinus pressure, sinus pain, sneezing and sore throat. Negative for congestion and ear pain.   ?Respiratory:  Positive for cough, shortness of breath and wheezing.   ?Cardiovascular:  Negative for chest pain.  ?Gastrointestinal:  Negative for diarrhea, nausea and vomiting.  ?Musculoskeletal:  Positive for myalgias. Negative for arthralgias.  ?Neurological:  Negative for dizziness, light-headedness and headaches.  ? ? ? ? Objective  ?  ?BP 128/69 (BP Location: Left Arm, Patient Position: Sitting, Cuff Size: Normal)   Pulse 75   Temp 98.1 ?F (36.7 ?C) (Temporal)   Wt 135 lb (61.2 kg)   BMI 24.69 kg/m?  ? ? ? ? ?Physical Exam ?Constitutional:   ?   General: She is awake.  ?   Appearance: Normal appearance. She is well-developed and well-groomed.  ?Pulmonary:  ?   Effort: Pulmonary effort is normal.  ?   Comments: Actively coughing during apt  ?Neurological:  ?   General: No focal deficit present.  ?   Mental Status: She is alert and oriented to person, place, and time.  ?   GCS: GCS eye subscore is 4. GCS verbal subscore is 5. GCS motor subscore is 6.  ?Psychiatric:     ?   Attention and Perception: Attention normal.     ?   Mood and Affect: Mood and affect normal.     ?   Speech: Speech normal.     ?   Behavior: Behavior normal. Behavior is cooperative.  ?  ? ? ? Assessment & Plan  ?  ? ?Problem List Items Addressed This Visit   ?None ?Visit Diagnoses   ? ? Upper respiratory tract infection, unspecified type    -  Primary ?Acute, new  problem ?Visit with patient indicates symptoms non-productive, SOB, cough, rhinorrhea, body aches since Tuesday congruent with acute URI that is likely viral in nature  ?Due to nature and duration of symptoms recommended treatment regimen is symptomatic relief and follow up if needed ?Will provide Tessalon pearls 100 mg to assist with cough ?Will provide Ventolin inhaler to assist with SOB and wheezing  ?Discussed with patient the various viral and bacterial etiologies of current illness and appropriate course of treatment ?Discussed OTC medication options for multisymptom relief such as Dayquil/Nyquil, Theraflu, AlkaSeltzer, etc. ?Discussed return precautions if symptoms are not improving or worsen over next 5-7 days.  ? ? ?  ? Cough     ?Acute, new problem ?Likely secondary to URI  ?Tessalon pearls to assist with coughing  ?Follow up as needed   ? Relevant  Medications  ? albuterol (VENTOLIN HFA) 108 (90 Base) MCG/ACT inhaler  ? benzonatate (TESSALON) 100 MG capsule  ? Wheeze     ?Acute, new problem ?Likely secondary to URI  ?Will refill Albuterol inhaler to assist with SOB and wheezing  ?Follow up as needed.  ? Relevant Medications  ? albuterol (VENTOLIN HFA) 108 (90 Base) MCG/ACT inhaler  ? ?  ? ?I discussed the assessment and treatment plan with the patient. The patient was provided an opportunity to ask questions and all were answered. The patient agreed with the plan and demonstrated an understanding of the instructions. ?  ?The patient was advised to call back or seek an in-person evaluation if the symptoms worsen or if the condition fails to improve as anticipated. ? ?I provided 22 minutes of non-face-to-face time during this encounter. ? ? ?No follow-ups on file. ? ? ?I, Fatisha Rabalais E Ailyn Gladd, PA-C, have reviewed all documentation for this visit. The documentation on 06/30/21 for the exam, diagnosis, procedures, and orders are all accurate and complete. ? ? ?Teralyn Mullins, MHS, PA-C ?[facility] ?[facility]  ? ? ? ? ? ?

## 2021-06-30 NOTE — Telephone Encounter (Signed)
Summary: cough and congestion worsening  ? Pt was just seen and told dr Caryn Section she felt like she was starting to get a cold / pt has been taking OTC stuff./ pt asked what she can do to help with relief or an RX/ she stated she has a cough a major congestion / pt doesn't want it to turn into pneumonia / please advise   ?  ? ?Reason for Disposition ? Wheezing is present ? ?Answer Assessment - Initial Assessment Questions ?1. ONSET: "When did the cough begin?"  ?    Sunday- exposure to children at church ?2. SEVERITY: "How bad is the cough today?"  ?    Worse- keeping her up at night- not loose yet ?3. SPUTUM: "Describe the color of your sputum" (none, dry cough; clear, white, yellow, green) ?    unsure ?4. HEMOPTYSIS: "Are you coughing up any blood?" If so ask: "How much?" (flecks, streaks, tablespoons, etc.) ?    no ?5. DIFFICULTY BREATHING: "Are you having difficulty breathing?" If Yes, ask: "How bad is it?" (e.g., mild, moderate, severe)  ?  - MILD: No SOB at rest, mild SOB with walking, speaks normally in sentences, can lie down, no retractions, pulse < 100.  ?  - MODERATE: SOB at rest, SOB with minimal exertion and prefers to sit, cannot lie down flat, speaks in phrases, mild retractions, audible wheezing, pulse 100-120.  ?  - SEVERE: Very SOB at rest, speaks in single words, struggling to breathe, sitting hunched forward, retractions, pulse > 120  ?    Breathing is ok- patient states she is feeling awful ?6. FEVER: "Do you have a fever?" If Yes, ask: "What is your temperature, how was it measured, and when did it start?" ?    no ?7. CARDIAC HISTORY: "Do you have any history of heart disease?" (e.g., heart attack, congestive heart failure)  ?    BP normal ?8. LUNG HISTORY: "Do you have any history of lung disease?"  (e.g., pulmonary embolus, asthma, emphysema) ?      ?9. PE RISK FACTORS: "Do you have a history of blood clots?" (or: recent major surgery, recent prolonged travel, bedridden) ?      ?10. OTHER  SYMPTOMS: "Do you have any other symptoms?" (e.g., runny nose, wheezing, chest pain) ?      wheezing ?11. PREGNANCY: "Is there any chance you are pregnant?" "When was your last menstrual period?" ?        ?12. TRAVEL: "Have you traveled out of the country in the last month?" (e.g., travel history, exposures) ?      Possible exposure to children ? ?Protocols used: Cough - Acute Productive-A-AH ? ?

## 2021-06-30 NOTE — Patient Instructions (Addendum)
Based on your described symptoms and the duration of symptoms it is likely that you have a viral upper respiratory infection (often called a "cold") ? ?Symptoms can last for 3-10 days with lingering cough and intermittent symptoms lasting weeks after that. ? ?The goal of treatment at this time is to reduce your symptoms and discomfort  ? ?I have sent in Tessalon pearls for you to take twice per day to help with your cough ? ?I also have sent in a new albuterol inhaler - 2 puffs every 6 hours as needed for coughing, shortness of breath, wheezing ? ? ?You can use over the counter medications such as Dayquil/Nyquil, AlkaSeltzer formulations, etc to provide further relief of symptoms according to the manufacturer's instructions  ?If preferred you can use Coricidin to manage your symptoms rather than those medications mentioned above.  ? ? ?If your symptoms do not improve or become worse in the next 5-7 days please make an apt at the office so we can see you  ?Go to the ER if you begin to have more serious symptoms such as shortness of breath, trouble breathing, loss of consciousness, swelling around the eyes, high fever, severe lasting headaches, vision changes or neck pain/stiffness.  ? ?

## 2021-07-04 ENCOUNTER — Ambulatory Visit: Payer: Self-pay

## 2021-07-04 DIAGNOSIS — J069 Acute upper respiratory infection, unspecified: Secondary | ICD-10-CM

## 2021-07-04 MED ORDER — AZITHROMYCIN 250 MG PO TABS: ORAL_TABLET | ORAL | 0 refills | Status: AC

## 2021-07-04 NOTE — Telephone Encounter (Signed)
?  Chief Complaint: no improvement of symptoms ?Symptoms: congested cough cough, headache, loss of voice ?Frequency: since 06/26/21 ?Pertinent Negatives: Patient denies fever, chest pain,  ?Disposition: '[]'$ ED /'[]'$ Urgent Care (no appt availability in office) / '[]'$ Appointment(In office/virtual)/ '[]'$  [facility]/ '[]'$ Home Care/ '[]'$ Refused Recommended Disposition /'[]'$ [facility]/ '[x]'$  Follow-up with PCP ?Additional Notes: pt can talk at whisper level but is still hard to understand. Pt stated that she would like a prescription for antibiotics sent to [facility]. Pt stated abx are the only thing that helps her get better. ? ? ? ?Answer Assessment - Initial Assessment Questions ?1. ONSET: "When did the cough begin?"  ?    06/26/21 ?2. SEVERITY: "How bad is the cough today?"  ?    Frequent cough ?3. SPUTUM: "Describe the color of your sputum" (none, dry cough; clear, white, yellow, green) ?    none ?4. HEMOPTYSIS: "Are you coughing up any blood?" If so ask: "How much?" (flecks, streaks, tablespoons, etc.) ?    no ?5. DIFFICULTY BREATHING: "Are you having difficulty breathing?" If Yes, ask: "How bad is it?" (e.g., mild, moderate, severe)  ?  - MILD: No SOB at rest, mild SOB with walking, speaks normally in sentences, can lie down, no retractions, pulse < 100.  ?  - MODERATE: SOB at rest, SOB with minimal exertion and prefers to sit, cannot lie down flat, speaks in phrases, mild retractions, audible wheezing, pulse 100-120.  ?  - SEVERE: Very SOB at rest, speaks in single words, struggling to breathe, sitting hunched forward, retractions, pulse > 120  ?    mild ?6. FEVER: "Do you have a fever?" If Yes, ask: "What is your temperature, how was it measured, and when did it start?" ?    no ?7. CARDIAC HISTORY: "Do you have any history of heart disease?" (e.g., heart attack, congestive heart failure)  ?    *No Answer* ?8. LUNG HISTORY: "Do you have any history of lung disease?"  (e.g., pulmonary embolus,  asthma, emphysema) ?    *No Answer* ?9. PE RISK FACTORS: "Do you have a history of blood clots?" (or: recent major surgery, recent prolonged travel, bedridden) ?    no ?10. OTHER SYMPTOMS: "Do you have any other symptoms?" (e.g., runny nose, wheezing, chest pain) ?      Has lost her voice- whispers only ?11. PREGNANCY: "Is there any chance you are pregnant?" "When was your last menstrual period?" ?      N/a ? ?Protocols used: Cough - Acute Non-Productive-A-AH ? ?

## 2021-07-04 NOTE — Addendum Note (Signed)
Addended by: Birdie Sons on: 07/04/2021 05:06 PM ? ? Modules accepted: Orders ? ?

## 2021-07-08 ENCOUNTER — Ambulatory Visit: Payer: Self-pay | Admitting: *Deleted

## 2021-07-08 NOTE — Telephone Encounter (Signed)
Reason for Disposition ? Health Information question, no triage required and triager able to answer question ? ?Answer Assessment - Initial Assessment Questions ?1. REASON FOR CALL or QUESTION: "What is your reason for calling today?" or "How can I best help you?" or "What question do you have that I can help answer?" ?    My sister is coming in to stay with me for a while and I've been sick with coughing and URI.  I'm not running fever now and my voice is back.  My symptoms are much better.   My voice is still hoarse today.  I haven't been able to talk for 3 days until today.   ?  Do I need to wear a mask when my sister comes to stay with me? ? ?I let her know as long as she did not have fever for 24 hrs without using medication to keep it down and her symptoms are resolving, which she said they are then she would not need to wear a mask while her sister is staying with her.   I let her know if she started developing new symptoms or her symptoms started coming back along with fever it would be a good idea to wear a mask and not be around each other as much as possible. ?I instructed her to continue wiping down frequently touched surfaces with disinfectant wipes which she said she was doing already. ? ?She thanked me very much for calling her back and helping her feel better about it all in regards to her sister coming in. ? ?Protocols used: Information Only Call - No Triage-A-AH ? ?

## 2021-07-15 NOTE — Telephone Encounter (Signed)
Patient left message to cancel procedure

## 2021-07-18 ENCOUNTER — Ambulatory Visit (INDEPENDENT_AMBULATORY_CARE_PROVIDER_SITE_OTHER): Payer: Medicare HMO | Admitting: Family Medicine

## 2021-07-18 ENCOUNTER — Encounter: Payer: Self-pay | Admitting: Family Medicine

## 2021-07-18 VITALS — BP 132/70 | HR 76 | Temp 97.9°F | Resp 14 | Wt 135.8 lb

## 2021-07-18 DIAGNOSIS — M810 Age-related osteoporosis without current pathological fracture: Secondary | ICD-10-CM

## 2021-07-18 DIAGNOSIS — R69 Illness, unspecified: Secondary | ICD-10-CM | POA: Diagnosis not present

## 2021-07-18 DIAGNOSIS — M2550 Pain in unspecified joint: Secondary | ICD-10-CM | POA: Diagnosis not present

## 2021-07-18 DIAGNOSIS — J069 Acute upper respiratory infection, unspecified: Secondary | ICD-10-CM

## 2021-07-18 DIAGNOSIS — F341 Dysthymic disorder: Secondary | ICD-10-CM | POA: Diagnosis not present

## 2021-07-18 MED ORDER — AZITHROMYCIN 250 MG PO TABS: ORAL_TABLET | ORAL | 0 refills | Status: AC

## 2021-07-18 MED ORDER — AZITHROMYCIN 250 MG PO TABS: ORAL_TABLET | ORAL | 0 refills | Status: DC

## 2021-07-18 NOTE — Progress Notes (Unsigned)
I,Jana Robinson,acting as a scribe for [name], MD.,have documented all relevant documentation on the behalf of [name], MD,as directed by  [name], MD while in the presence of [name], MD.  Established patient visit   Patient: Laura Love   DOB: Feb 05, 1941   80 y.o. Female  MRN: [id number] Visit Date: 07/18/2021  Today's healthcare provider: [name], MD   No chief complaint on file.  Subjective    HPI  Upper respiratory symptoms continued symptoms   She complains of congestion, cough described as productive, nasal congestion, post nasal drip, shortness of breath, sneezing, and wheezing.with no fever, chills, night sweats or weight loss. Onset of symptoms was  3 wks ago  and staying constant.She is drinking plenty of fluids.  Past history is significant for no history of pneumonia or bronchitis. Patient is non-smoker. Patient completed azithromycin prescribed 07/04/2021 and felt much better but symptoms are lingering.    She is increasing concerned about living by herself. She she was sick she felt completely helpless and was nearly unable to care for herself. She is concerned that she needs to be preparing for end of life care and is feels like she needs to talk to hospice about advanced care planning. However she does not currently have any known terminal illnesses and life expectancy clearly exceeds 6 months.   ---------------------------------------------------------------------------------------------------  -----------------------------------------------------------------------------------------   Medications: Outpatient Medications Prior to Visit  Medication Sig Note   acetaminophen (TYLENOL) 325 MG tablet Take 650 mg by mouth every 6 (six) hours as needed. 1 a day    albuterol (VENTOLIN HFA) 108 (90 Base) MCG/ACT inhaler Inhale 2 puffs into the lungs every 6 (six) hours as needed for wheezing or shortness of breath.    benzonatate (TESSALON)  100 MG capsule Take 1 capsule (100 mg total) by mouth 2 (two) times daily as needed for cough.    Biotin 1000 MCG tablet Take 1,000 mcg by mouth daily.    buPROPion (WELLBUTRIN SR) 150 MG 12 hr tablet TAKE ONE TABLET TWICE DAILY    CALCIUM PO Take 600 mg by mouth 2 (two) times daily. + D3 800 units    dicyclomine (BENTYL) 10 MG capsule Take 1 capsule (10 mg total) by mouth 4 (four) times daily -  before meals and at bedtime. As needed for abdominal pain    ezetimibe (ZETIA) 10 MG tablet TAKE ONE TABLET EVERY DAY    hyoscyamine (ANASPAZ) 0.125 MG TBDP disintergrating tablet Place 1 tablet (0.125 mg total) under the tongue every 4 (four) hours as needed.    ibuprofen (ADVIL,MOTRIN) 200 MG tablet Take 200 mg by mouth every 6 (six) hours as needed. 1 tablet daily    Melatonin 3 MG TABS Take 1 tablet by mouth at bedtime.    sennosides-docusate sodium (SENOKOT-S) 8.6-50 MG tablet Take 2-3 tablets by mouth 2 (two) times daily. 3 tablets every morning, then 3-4 tablets in the evenings    SYNTHROID 100 MCG tablet TAKE 1 TABLET BY MOUTH DAILY    vitamin C (ASCORBIC ACID) 500 MG tablet Take 500 mg by mouth daily.    Vitamin D, Cholecalciferol, 1000 UNITS TABS Take 2 tablets by mouth daily.    Zinc 50 MG CAPS Take by mouth.    zinc gluconate 50 MG tablet Take 50 mg by mouth daily as needed.    zoledronic acid (RECLAST) 5 MG/100ML SOLN injection Inject 5 mg into the vein. Once yearly starting June 2019 per  Dr. Gabriel Carina    No facility-administered medications prior to visit.         Objective    BP 132/70 (BP Location: Right Arm, Patient Position: Sitting, Cuff Size: Normal)   Pulse 76   Temp 97.9 F (36.6 C) (Oral)   Resp 14   Wt 135 lb 12.8 oz (61.6 kg)   SpO2 94%   BMI 24.84 kg/m     General: Appearance:    Well developed, well nourished female in no acute distress  Eyes:    PERRL, conjunctiva/corneas clear, EOM's intact       Lungs:     Clear to auscultation bilaterally, respirations  unlabored  Heart:    Normal heart rate. Normal rhythm. No murmurs, rubs, or gallops.    MS:   All extremities are intact.    Neurologic:   Awake, alert, oriented x 3. No apparent focal neurological defect.         Assessment & Plan     1. Upper respiratory tract infection, unspecified type By her description her symptoms were severe and likely consistent with bronchitis prior to taking the Zpack prescribed last week. Is much improved but not resolved.  - azithromycin (ZITHROMAX) 250 MG tablet; Take 2 tablets on day 1, then 1 tablet daily on days 2 through 5  Dispense: 6 tablet; Refill: 0  2. Arthralgia, unspecified joint She is concerned that she is slowly becoming debilitated and may not be able to continue care for herself over the next several months or years. She would like help with advanced care planning.  - Amb Referral to Palliative Care  3. Dysthymia  - Amb Referral to Palliative Care  4. Osteoporosis, unspecified osteoporosis type, unspecified pathological fracture presence  - Amb Referral to Palliative Care       The entirety of the information documented in the History of Present Illness, Review of Systems and Physical Exam were personally obtained by me. Portions of this information were initially documented by the CMA and reviewed by me for thoroughness and accuracy.     [name], MD  [facility] [phone] (phone) [phone] (fax)  [place]

## 2021-07-27 ENCOUNTER — Telehealth: Payer: Self-pay | Admitting: Student

## 2021-07-27 NOTE — Telephone Encounter (Signed)
Ret'd call to patient and discussed the Palliative referral/services with her and she has declined Palliative services at this time and stated that she would call us back if she changed her mind.  Will cancel the referral. 

## 2021-08-11 ENCOUNTER — Encounter: Payer: Self-pay | Admitting: Family Medicine

## 2021-08-11 ENCOUNTER — Ambulatory Visit
Admission: RE | Admit: 2021-08-11 | Discharge: 2021-08-11 | Disposition: A | Discharge status: Home / Self Care | Payer: Medicare HMO | Source: Ambulatory Visit | Attending: Family Medicine | Admitting: Family Medicine

## 2021-08-11 ENCOUNTER — Ambulatory Visit (INDEPENDENT_AMBULATORY_CARE_PROVIDER_SITE_OTHER): Payer: Medicare HMO | Admitting: Family Medicine

## 2021-08-11 ENCOUNTER — Ambulatory Visit
Admission: RE | Admit: 2021-08-11 | Discharge: 2021-08-11 | Disposition: A | Discharge status: Home / Self Care | Payer: Medicare HMO | Attending: Family Medicine | Admitting: Family Medicine

## 2021-08-11 VITALS — BP 136/77 | HR 80 | Temp 98.0°F | Resp 16 | Ht 62.0 in | Wt 133.6 lb

## 2021-08-11 DIAGNOSIS — J42 Unspecified chronic bronchitis: Secondary | ICD-10-CM | POA: Diagnosis not present

## 2021-08-11 DIAGNOSIS — R1311 Dysphagia, oral phase: Secondary | ICD-10-CM | POA: Insufficient documentation

## 2021-08-11 DIAGNOSIS — J309 Allergic rhinitis, unspecified: Secondary | ICD-10-CM

## 2021-08-11 DIAGNOSIS — R1310 Dysphagia, unspecified: Secondary | ICD-10-CM | POA: Diagnosis not present

## 2021-08-11 DIAGNOSIS — R053 Chronic cough: Secondary | ICD-10-CM | POA: Diagnosis not present

## 2021-08-11 NOTE — Assessment & Plan Note (Signed)
Chronic, wax/wanes No on regular medication

## 2021-08-11 NOTE — Progress Notes (Signed)
Established patient visit   Patient: Laura Love   DOB: 01/27/1941   81 y.o. Female  MRN: [id number] Visit Date: 08/11/2021  Today's healthcare provider: Gwyneth Sprout, FNP  Introduced to nurse practitioner role and practice setting.  All questions answered.  Discussed provider/patient relationship and expectations.   I,Tiffany J Bragg,acting as a scribe for Gwyneth Sprout, FNP.,have documented all relevant documentation on the behalf of Gwyneth Sprout, FNP,as directed by  Gwyneth Sprout, FNP while in the presence of Gwyneth Sprout, FNP.   Chief Complaint  Patient presents with   URI   Subjective    HPI  Upper respiratory symptoms She complains of productive cough with  clear colored sputum.with no fever, chills, night sweats or weight loss. Onset of symptoms was a few months ago and  cough comes and goes .She is drinking plenty of fluids.  Past history is significant for no history of pneumonia or bronchitis. Patient is non-smoker  ---------------------------------------------------------------------------------------------------   Medications: Outpatient Medications Prior to Visit  Medication Sig   acetaminophen (TYLENOL) 325 MG tablet Take 650 mg by mouth every 6 (six) hours as needed. 1 a day   albuterol (VENTOLIN HFA) 108 (90 Base) MCG/ACT inhaler Inhale 2 puffs into the lungs every 6 (six) hours as needed for wheezing or shortness of breath.   benzonatate (TESSALON) 100 MG capsule Take 1 capsule (100 mg total) by mouth 2 (two) times daily as needed for cough.   Biotin 1000 MCG tablet Take 1,000 mcg by mouth daily.   buPROPion (WELLBUTRIN SR) 150 MG 12 hr tablet TAKE ONE TABLET TWICE DAILY   CALCIUM PO Take 600 mg by mouth 2 (two) times daily. + D3 800 units   dicyclomine (BENTYL) 10 MG capsule Take 1 capsule (10 mg total) by mouth 4 (four) times daily -  before meals and at bedtime. As needed for abdominal pain   ezetimibe (ZETIA) 10 MG tablet TAKE ONE TABLET EVERY DAY    hyoscyamine (ANASPAZ) 0.125 MG TBDP disintergrating tablet Place 1 tablet (0.125 mg total) under the tongue every 4 (four) hours as needed.   ibuprofen (ADVIL,MOTRIN) 200 MG tablet Take 200 mg by mouth every 6 (six) hours as needed. 1 tablet daily   Melatonin 3 MG TABS Take 1 tablet by mouth at bedtime.   sennosides-docusate sodium (SENOKOT-S) 8.6-50 MG tablet Take 2-3 tablets by mouth 2 (two) times daily. 3 tablets every morning, then 3-4 tablets in the evenings   SYNTHROID 100 MCG tablet TAKE 1 TABLET BY MOUTH DAILY   vitamin C (ASCORBIC ACID) 500 MG tablet Take 500 mg by mouth daily.   Vitamin D, Cholecalciferol, 1000 UNITS TABS Take 2 tablets by mouth daily.   Zinc 50 MG CAPS Take by mouth.   zinc gluconate 50 MG tablet Take 50 mg by mouth daily as needed.   zoledronic acid (RECLAST) 5 MG/100ML SOLN injection Inject 5 mg into the vein. Once yearly starting June 2019 per Dr. Gabriel Carina   No facility-administered medications prior to visit.    Review of Systems    Objective    BP 136/77 (BP Location: Right Arm, Patient Position: Sitting, Cuff Size: Normal)   Pulse 80   Temp 98 F (36.7 C) (Oral)   Resp 16   Ht '5\' 2"'$  (1.575 m)   Wt 133 lb 9.6 oz (60.6 kg)   SpO2 97%   BMI 24.44 kg/m    Physical Exam Vitals and nursing note  reviewed.  Constitutional:      General: She is not in acute distress.    Appearance: Normal appearance. She is normal weight. She is not ill-appearing, toxic-appearing or diaphoretic.  HENT:     Head: Normocephalic and atraumatic.  Cardiovascular:     Rate and Rhythm: Normal rate and regular rhythm.     Pulses: Normal pulses.     Heart sounds: Normal heart sounds. No murmur heard.    No friction rub. No gallop.  Pulmonary:     Effort: Pulmonary effort is normal. No respiratory distress.     Breath sounds: Normal breath sounds. No stridor. No wheezing, rhonchi or rales.  Chest:     Chest wall: No tenderness.  Musculoskeletal:        General: No  swelling, tenderness, deformity or signs of injury. Normal range of motion.     Right lower leg: No edema.     Left lower leg: No edema.  Skin:    General: Skin is warm and dry.     Capillary Refill: Capillary refill takes less than 2 seconds.     Coloration: Skin is not jaundiced or pale.     Findings: No bruising, erythema, lesion or rash.  Neurological:     General: No focal deficit present.     Mental Status: She is alert and oriented to person, place, and time. Mental status is at baseline.     Cranial Nerves: No cranial nerve deficit.     Sensory: No sensory deficit.     Motor: No weakness.     Coordination: Coordination normal.  Psychiatric:        Mood and Affect: Mood normal.        Behavior: Behavior normal.        Thought Content: Thought content normal.        Judgment: Judgment normal.      No results found for any visits on 08/11/21.  Assessment & Plan     Problem List Items Addressed This Visit       Respiratory   Allergic rhinitis    Chronic, wax/wanes No on regular medication       Relevant Orders   Ambulatory referral to Pulmonology   Chronic bronchitis ([place]) - Primary    Reports acute on chronic bronchitis Coughs 5 mins each episode Using thyme, powdered, and ice to sooth bronchospasm Episodes are now daily or twice/daily; previously quarterly       Relevant Orders   Ambulatory referral to Pulmonology   DG Chest 2 View     Digestive   Oral phase dysphagia    Recommend use of speech with MBSS to check for choking Happens with or without food      Relevant Orders   Ambulatory referral to Gastroenterology   DG Chest 2 View     Return in about 3 months (around 11/11/2021), or if symptoms worsen or fail to improve.      Vonna Kotyk, FNP, have reviewed all documentation for this visit. The documentation on 08/11/21 for the exam, diagnosis, procedures, and orders are all accurate and complete.    Gwyneth Sprout, [place] [phone] (phone) [phone] (fax)  [place]

## 2021-08-11 NOTE — Assessment & Plan Note (Signed)
Reports acute on chronic bronchitis Coughs 5 mins each episode Using thyme, powdered, and ice to sooth bronchospasm Episodes are now daily or twice/daily; previously quarterly

## 2021-08-11 NOTE — Assessment & Plan Note (Signed)
Recommend use of speech with MBSS to check for choking Happens with or without food

## 2021-08-12 NOTE — Progress Notes (Signed)
Chest xray is normal and stable. Continue with referrals to GI.  Take care, Gwyneth Sprout, [address] [place], [place] [address] [phone] (phone) [phone] (fax) [place]

## 2021-08-17 DIAGNOSIS — M810 Age-related osteoporosis without current pathological fracture: Secondary | ICD-10-CM | POA: Diagnosis not present

## 2021-08-17 DIAGNOSIS — E039 Hypothyroidism, unspecified: Secondary | ICD-10-CM | POA: Diagnosis not present

## 2021-08-17 LAB — BASIC METABOLIC PANEL
BUN: 16 (ref 4–21)
CO2: 31 — AB (ref 13–22)
Chloride: 101 (ref 99–108)
Creatinine: 0.9 (ref 0.5–1.1)
Glucose: 86
Potassium: 4.4 mEq/L (ref 3.5–5.1)
Sodium: 137 (ref 137–147)

## 2021-08-17 LAB — COMPREHENSIVE METABOLIC PANEL
Calcium: 9.6 (ref 8.7–10.7)
eGFR: 60

## 2021-08-17 LAB — TSH: TSH: 1.13 (ref 0.41–5.90)

## 2021-08-22 ENCOUNTER — Other Ambulatory Visit: Payer: Self-pay | Admitting: Family Medicine

## 2021-08-22 DIAGNOSIS — Z1231 Encounter for screening mammogram for malignant neoplasm of breast: Secondary | ICD-10-CM

## 2021-08-26 ENCOUNTER — Other Ambulatory Visit: Payer: Self-pay | Admitting: Family Medicine

## 2021-08-26 DIAGNOSIS — F419 Anxiety disorder, unspecified: Secondary | ICD-10-CM

## 2021-08-26 NOTE — Telephone Encounter (Signed)
Requested medications are due for refill today.  Yes  Requested medications are on the active medications list.  yes  Last refill. 8/262022 #180 1 refill  Future visit scheduled.   no  Notes to clinic.  Labs are expired.    Requested Prescriptions  Pending Prescriptions Disp Refills   buPROPion (WELLBUTRIN SR) 150 MG 12 hr tablet [Pharmacy Med Name: BUPROPION HCL ER (SR) 150 MG TAB] 180 tablet 1    Sig: TAKE ONE TABLET TWICE DAILY     Psychiatry: Antidepressants - bupropion Failed - 08/26/2021 11:05 AM      Failed - Cr in normal range and within 360 days    Creatinine, Ser  Date Value Ref Range Status  05/06/2020 0.90 0.57 - 1.00 mg/dL Final         Failed - AST in normal range and within 360 days    AST  Date Value Ref Range Status  05/06/2020 14 0 - 40 IU/L Final         Failed - ALT in normal range and within 360 days    ALT  Date Value Ref Range Status  05/06/2020 16 0 - 32 IU/L Final         Passed - Completed PHQ-2 or PHQ-9 in the last 360 days      Passed - Last BP in normal range    BP Readings from Last 1 Encounters:  08/11/21 136/77         Passed - Valid encounter within last 6 months    Recent Outpatient Visits           2 weeks ago Chronic bronchitis, unspecified chronic bronchitis type [facility])   [employer] [facility] Gwyneth Sprout, FNP   1 month ago Upper respiratory tract infection, unspecified type   [facility] Birdie Sons, MD   1 month ago Upper respiratory tract infection, unspecified type   [employer], Dani Gobble, PA-C   1 month ago Impacted cerumen of right ear   [facility] Birdie Sons, MD   3 months ago Hearing loss of left ear due to cerumen impaction   [facility] Birdie Sons, MD

## 2021-08-29 ENCOUNTER — Ambulatory Visit: Payer: Medicare HMO

## 2021-09-01 ENCOUNTER — Other Ambulatory Visit: Payer: Self-pay

## 2021-09-07 ENCOUNTER — Encounter: Payer: Self-pay | Admitting: Gastroenterology

## 2021-09-07 ENCOUNTER — Ambulatory Visit: Payer: Medicare HMO | Admitting: Gastroenterology

## 2021-09-07 VITALS — BP 119/70 | HR 82 | Temp 98.7°F | Ht 62.0 in | Wt 132.2 lb

## 2021-09-07 DIAGNOSIS — Z86010 Personal history of colonic polyps: Secondary | ICD-10-CM

## 2021-09-07 NOTE — Progress Notes (Unsigned)
Laura Darby, MD [address]  [place], [place] [address]  Main: [phone]  Fax: [phone]    Gastroenterology Consultation  Referring Provider:     Birdie Sons, MD Primary Care Physician:  Laura Sons, MD Primary Gastroenterologist:  Dr. Cephas Love Reason for Consultation: To discuss about colonoscopy        HPI:   Laura Love is a 81 y.o. female referred by Dr. Birdie Sons, MD  for consultation & management of history of colon polyps.  Patient underwent surveillance colonoscopies within last 10 years, last 1 on 01/15/2018 at [facility].  She was found to have 1 polyp less than 1 cm which was tubular adenoma only.  I reviewed her prior colonoscopies which revealed small subcentimeter polyps only.  Patient does not have any GI symptoms.  Patient would like to know when she is due for her next colonoscopy.  Patient does not smoke or drink alcohol, otherwise healthy.  She denies any family history of GI malignancy  NSAIDs: None  Antiplts/Anticoagulants/Anti thrombotics: None  GI Procedures:  Colonoscopy with Dr. Brandon Love in 12/2017 at [facility] - Hemorrhoids found on perianal exam.                     - Melanosis in the colon.                     - Diverticulosis in the sigmoid colon.                     - One 4 mm polyp in the ascending colon, removed with a                     cold snare. Resected and retrieved.                     - Two benign appearing 2 mm polyps in the rectum, removed                     with a cold snare. Resected and retrieved.                     - Likely benign polypoid lesion from 0 to 1 cm proximal to                     the anus. Complete removal was accomplished.  Diagnosis    A: Colon, ascending, biopsy - Adenomatous polyp (1 fragment)   B: Colon, rectosigmoid, biopsy - Inflammatory / mucosal prolapse type polyp with reactive epithelial changes (1 fragment, bisected) - Hyperplastic polyp (1 fragment)   Past  Medical History:  Diagnosis Date   Clavicular fracture    Colon polyps    History of chicken pox    History of measles    History of mumps    Hyperlipidemia    Hypoglycemia    Osteoporosis     Past Surgical History:  Procedure Laterality Date   ABDOMINAL HYSTERECTOMY  1988   for endometriosis   BREAST BIOPSY Right 2004   CLOSED REDUCTION FACIAL FRACTURE       Current Outpatient Medications:    acetaminophen (TYLENOL) 325 MG tablet, Take 650 mg by mouth every 6 (six) hours as needed. 1 a day, Disp: , Rfl:    albuterol (VENTOLIN HFA) 108 (90 Base) MCG/ACT inhaler, Inhale 2 puffs into  the lungs every 6 (six) hours as needed for wheezing or shortness of breath., Disp: 18 g, Rfl: 1   benzonatate (TESSALON) 100 MG capsule, Take 1 capsule (100 mg total) by mouth 2 (two) times daily as needed for cough., Disp: 20 capsule, Rfl: 0   Biotin 1000 MCG tablet, Take 1,000 mcg by mouth daily., Disp: , Rfl:    buPROPion (WELLBUTRIN SR) 150 MG 12 hr tablet, TAKE ONE TABLET TWICE DAILY, Disp: 180 tablet, Rfl: 1   CALCIUM PO, Take 600 mg by mouth 2 (two) times daily. + D3 800 units, Disp: , Rfl:    dicyclomine (BENTYL) 10 MG capsule, Take 1 capsule (10 mg total) by mouth 4 (four) times daily -  before meals and at bedtime. As needed for abdominal pain, Disp: 100 capsule, Rfl: 3   ezetimibe (ZETIA) 10 MG tablet, TAKE ONE TABLET EVERY DAY, Disp: 90 tablet, Rfl: 4   hyoscyamine (ANASPAZ) 0.125 MG TBDP disintergrating tablet, Place 1 tablet (0.125 mg total) under the tongue every 4 (four) hours as needed., Disp: 30 tablet, Rfl: 3   ibuprofen (ADVIL,MOTRIN) 200 MG tablet, Take 200 mg by mouth every 6 (six) hours as needed. 1 tablet daily, Disp: , Rfl:    Melatonin 3 MG TABS, Take 1 tablet by mouth at bedtime., Disp: , Rfl:    sennosides-docusate sodium (SENOKOT-S) 8.6-50 MG tablet, Take 2-3 tablets by mouth 2 (two) times daily. 3 tablets every morning, then 3-4 tablets in the evenings, Disp: , Rfl:     SYNTHROID 100 MCG tablet, TAKE 1 TABLET BY MOUTH DAILY, Disp: 90 tablet, Rfl: 3   vitamin C (ASCORBIC ACID) 500 MG tablet, Take 500 mg by mouth daily., Disp: , Rfl:    Vitamin D, Cholecalciferol, 1000 UNITS TABS, Take 2 tablets by mouth daily., Disp: , Rfl:    Zinc 50 MG CAPS, Take by mouth., Disp: , Rfl:    zinc gluconate 50 MG tablet, Take 50 mg by mouth daily as needed., Disp: , Rfl:    zoledronic acid (RECLAST) 5 MG/100ML SOLN injection, Inject 5 mg into the vein. Once yearly starting June 2019 per Dr. Gabriel Carina, Disp: , Rfl:    Family History  Problem Relation Age of Onset   Heart attack Father    Leukemia Father    Hyperlipidemia Mother    Hypertension Mother    Breast cancer Mother    Hyperlipidemia Sister    Breast cancer Sister    Hyperlipidemia Brother      Social History   Tobacco Use   Smoking status: Former    Types: Cigarettes   Smokeless tobacco: Never   Tobacco comments:    quit in [place] Use   Vaping Use: Never used  Substance Use Topics   Alcohol use: No    Alcohol/week: 0.0 standard drinks of alcohol   Drug use: No    Allergies as of 09/07/2021 - Review Complete 09/07/2021  Allergen Reaction Noted   Alendronate Nausea Only 04/03/2018   Crestor [rosuvastatin calcium]  12/21/2014   Cymbalta [duloxetine hcl] Other (See Comments) 05/26/2021   Duloxetine Nausea Only 12/31/2018   Pentazocine Other (See Comments) 12/21/2005    Review of Systems:    All systems reviewed and negative except where noted in HPI.   Physical Exam:  BP 119/70 (BP Location: Left Arm, Patient Position: Sitting, Cuff Size: Normal)   Pulse 82   Temp 98.7 F (37.1 C) (Oral)   Ht '5\' 2"'$  (1.575 m)  Wt 132 lb 4 oz (60 kg)   BMI 24.19 kg/m  No LMP recorded. Patient has had a hysterectomy.  General:   Alert,  Well-developed, well-nourished, pleasant and cooperative in NAD Head:  Normocephalic and atraumatic. Eyes:  Sclera clear, no icterus.   Conjunctiva pink. Ears:  Normal  auditory acuity. Nose:  No deformity, discharge, or lesions. Mouth:  No deformity or lesions,oropharynx pink & moist. Neck:  Supple; no masses or thyromegaly. Lungs:  Respirations even and unlabored.  Clear throughout to auscultation.   No wheezes, crackles, or rhonchi. No acute distress. Heart:  Regular rate and rhythm; no murmurs, clicks, rubs, or gallops. Abdomen:  Normal bowel sounds. Soft, non-tender and non-distended without masses, hepatosplenomegaly or hernias noted.  No guarding or rebound tenderness.   Rectal: Not performed Msk:  Symmetrical without gross deformities. Good, equal movement & strength bilaterally. Pulses:  Normal pulses noted. Extremities:  No clubbing or edema.  No cyanosis. Neurologic:  Alert and oriented x3;  grossly normal neurologically. Skin:  Intact without significant lesions or rashes. No jaundice. Psych:  Alert and cooperative. Normal mood and affect.  Imaging Studies: No abdominal imaging  Assessment and Plan:   Laura Love is a 81 y.o. pleasant Caucasian female with history of hypertension, hypothyroidism with history of tubular adenomas of the colon, less than 1 cm in size.  After reviewing her multiple colonoscopy reports within last 10 years, I recommend patient to undergo surveillance colonoscopy in 12/2022.  We will update on her health maintenance    Follow up as needed   Laura Darby, MD

## 2021-09-14 ENCOUNTER — Ambulatory Visit: Payer: Medicare HMO | Admitting: Gastroenterology

## 2021-10-06 ENCOUNTER — Other Ambulatory Visit: Payer: Self-pay | Admitting: Family Medicine

## 2021-10-06 DIAGNOSIS — F419 Anxiety disorder, unspecified: Secondary | ICD-10-CM

## 2021-10-06 NOTE — Telephone Encounter (Signed)
Requested medication (s) are due for refill today:   No requesting early  Requested medication (s) are on the active medication list:   Yes  Future visit scheduled:   No     Last ordered: 08/26/2021 #180, 1 refill  Returned due to the note from pharmacy about swallowing uncoated tablets and needing a refill.    Provider to review   Requested Prescriptions  Pending Prescriptions Disp Refills   buPROPion (WELLBUTRIN SR) 150 MG 12 hr tablet [Pharmacy Med Name: BUPROPION HCL ER (SR) 150 MG TAB] 180 tablet 1    Sig: TAKE ONE TABLET TWICE DAILY     Psychiatry: Antidepressants - bupropion Failed - 10/06/2021 10:31 AM      Failed - Cr in normal range and within 360 days    Creatinine, Ser  Date Value Ref Range Status  05/06/2020 0.90 0.57 - 1.00 mg/dL Final         Failed - AST in normal range and within 360 days    AST  Date Value Ref Range Status  05/06/2020 14 0 - 40 IU/L Final         Failed - ALT in normal range and within 360 days    ALT  Date Value Ref Range Status  05/06/2020 16 0 - 32 IU/L Final         Passed - Completed PHQ-2 or PHQ-9 in the last 360 days      Passed - Last BP in normal range    BP Readings from Last 1 Encounters:  09/07/21 119/70         Passed - Valid encounter within last 6 months    Recent Outpatient Visits           1 month ago Chronic bronchitis, unspecified chronic bronchitis type [facility])   [facility] Gwyneth Sprout, FNP   2 months ago Upper respiratory tract infection, unspecified type   [facility] Birdie Sons, MD   3 months ago Upper respiratory tract infection, unspecified type   [employer], Dani Gobble, PA-C   3 months ago Impacted cerumen of right ear   [facility] Birdie Sons, MD   4 months ago Hearing loss of left ear due to cerumen impaction   [facility] Birdie Sons, MD

## 2021-10-13 ENCOUNTER — Ambulatory Visit: Payer: Self-pay

## 2021-10-13 NOTE — Telephone Encounter (Signed)
  Chief Complaint: depression/ fatigue Symptoms: pt states she feels very sluggish, no energy, feel weighed down  Frequency: ongoing for long time  Pertinent Negatives: Patient denies chest pain, SOB, SI or HI Disposition: '[]'$ ED /'[]'$ Urgent Care (no appt availability in office) / '[x]'$ Appointment(In office/virtual)/ '[]'$  [place] [facility]/ '[]'$ Home Care/ '[]'$ Refused Recommended Disposition /'[]'$  Mobile Bus/ '[]'$  Follow-up with PCP Additional Notes: pt prefers to only see Dr. Caryn Section. Pt states she was on abx for tooth infection, felt really good after starting abx and then once completed started feeling above sx symptoms. Pt also reported had 3 death back to back last year. She is concerned about going infection rather than worsening depression. I rescheduled appt for 10/17/21 at 1:40 and advised her if any sx get worse or starts having any anxiety depression related sx to call back. Pt verbalized understanding.   Reason for Disposition  [1] Depression AND [2] worsening (e.g., sleeping poorly, less able to do activities of daily living)  Answer Assessment - Initial Assessment Questions 1. CONCERN: "What happened that made you call today?"     Just feeling very fatigued, feeling heavy- weighted down  2. DEPRESSION SYMPTOM SCREENING: "How are you feeling overall?" (e.g., decreased energy, increased sleeping or difficulty sleeping, difficulty concentrating, feelings of sadness, guilt, hopelessness, or worthlessness)     Sluggish, no energy to do anything  3. RISK OF HARM - SUICIDAL IDEATION:  "Do you ever have thoughts of hurting or killing yourself?"  (e.g., yes, no, no but preoccupation with thoughts about death)   - INTENT:  "Do you have thoughts of hurting or killing yourself right NOW?" (e.g., yes, no, N/A)   - PLAN: "Do you have a specific plan for how you would do this?" (e.g., gun, knife, overdose, no plan, N/A)     no 5. FUNCTIONAL IMPAIRMENT: "How have things been going for you overall?  Have you had more difficulty than usual doing your normal daily activities?"  (e.g., better, same, worse; self-care, school, work, interactions)     Was on abx for tooth and fet better but now getting back to feeling sluggish  6. SUPPORT: "Who is with you now?" "Who do you live with?" "Do you have family or friends who you can talk to?"      Reports having good support system  7. THERAPIST: "Do you have a counselor or therapist? Name?"     no 8. STRESSORS: "Has there been any new stress or recent changes in your life?"     3 deaths close together last year  11. OTHER: "Do you have any other physical symptoms right now?" (e.g., fever)       no  Protocols used: Depression-A-AH

## 2021-10-17 ENCOUNTER — Encounter: Payer: Self-pay | Admitting: Family Medicine

## 2021-10-17 ENCOUNTER — Ambulatory Visit (INDEPENDENT_AMBULATORY_CARE_PROVIDER_SITE_OTHER): Payer: Medicare HMO | Admitting: Family Medicine

## 2021-10-17 VITALS — BP 126/60 | HR 75 | Temp 98.0°F | Wt 131.0 lb

## 2021-10-17 DIAGNOSIS — R5383 Other fatigue: Secondary | ICD-10-CM

## 2021-10-17 NOTE — Progress Notes (Signed)
I,Roshena L Chambers,acting as a scribe for [name], MD.,have documented all relevant documentation on the behalf of [name], MD,as directed by  [name], MD while in the presence of [name], MD.   Established patient visit   Patient: Laura Love   DOB: 31-Jul-1940   81 y.o. Female  MRN: [id number] Visit Date: 10/17/2021  Today's healthcare provider: [name], MD   Chief Complaint  Patient presents with   Fatigue   Subjective    Patient is a 81 yr old female presenting for fatigue. Reports she feels very sluggish, no energy, feel weighted down, trouble sleeping and less able to do activities of daily living.  She is not having any difficulty sleepy but states she feels her hair is thinning out. Denies chest pain, SOB. Reports three deaths close together in past year. She states that she had a dental infection in July and prescribed 10 day course of amoxicillin during which she felt much better. Fatigue returned within a few days of finishing the antibiotic. She is concerned about possibility of chronic lyme disease since [place] and is frequently exposed to tics.   Medications: Outpatient Medications Prior to Visit  Medication Sig   acetaminophen (TYLENOL) 325 MG tablet Take 650 mg by mouth every 6 (six) hours as needed. 1 a day   albuterol (VENTOLIN HFA) 108 (90 Base) MCG/ACT inhaler Inhale 2 puffs into the lungs every 6 (six) hours as needed for wheezing or shortness of breath.   Biotin 1000 MCG tablet Take 1,000 mcg by mouth daily.   buPROPion (WELLBUTRIN SR) 150 MG 12 hr tablet TAKE ONE TABLET TWICE DAILY   CALCIUM PO Take 600 mg by mouth 2 (two) times daily. + D3 800 units   dicyclomine (BENTYL) 10 MG capsule Take 1 capsule (10 mg total) by mouth 4 (four) times daily -  before meals and at bedtime. As needed for abdominal pain   ezetimibe (ZETIA) 10 MG tablet TAKE ONE TABLET EVERY DAY   hyoscyamine (ANASPAZ) 0.125 MG TBDP  disintergrating tablet Place 1 tablet (0.125 mg total) under the tongue every 4 (four) hours as needed.   ibuprofen (ADVIL,MOTRIN) 200 MG tablet Take 200 mg by mouth every 6 (six) hours as needed. 1 tablet daily   Melatonin 3 MG TABS Take 1 tablet by mouth at bedtime.   sennosides-docusate sodium (SENOKOT-S) 8.6-50 MG tablet Take 2-3 tablets by mouth 2 (two) times daily. 3 tablets every morning, then 3-4 tablets in the evenings   SYNTHROID 100 MCG tablet TAKE 1 TABLET BY MOUTH DAILY   vitamin C (ASCORBIC ACID) 500 MG tablet Take 500 mg by mouth daily.   Vitamin D, Cholecalciferol, 1000 UNITS TABS Take 2 tablets by mouth daily.   Zinc 50 MG CAPS Take by mouth.   zinc gluconate 50 MG tablet Take 50 mg by mouth daily as needed.   zoledronic acid (RECLAST) 5 MG/100ML SOLN injection Inject 5 mg into the vein. Once yearly starting June 2019 per Dr. Gabriel Carina   benzonatate (TESSALON) 100 MG capsule Take 1 capsule (100 mg total) by mouth 2 (two) times daily as needed for cough. (Patient not taking: Reported on 10/17/2021)   No facility-administered medications prior to visit.    Review of Systems  Constitutional:  Positive for fatigue. Negative for appetite change, chills and fever.  Respiratory:  Negative for chest tightness and shortness of breath.   Cardiovascular:  Negative for chest pain and palpitations.  Gastrointestinal:  Negative for abdominal pain, nausea and vomiting.  Neurological:  Negative for dizziness and weakness.  Psychiatric/Behavioral:  Positive for sleep disturbance.        Objective    BP 126/60 (BP Location: Left Arm, Patient Position: Sitting, Cuff Size: Normal)   Pulse 75   Temp 98 F (36.7 C) (Oral)   Wt 131 lb (59.4 kg)   SpO2 99% Comment: room air  BMI 23.96 kg/m    Physical Exam  General appearance: Well developed, well nourished female, cooperative and in no acute distress Head: Normocephalic, without obvious abnormality, atraumatic Respiratory: Respirations  even and unlabored, normal respiratory rate Extremities: All extremities are intact.  Skin: Skin color, texture, turgor normal. No rashes seen  Psych: Appropriate mood and affect. Neurologic: Mental status: Alert, oriented to person, place, and time, thought content appropriate.   Assessment & Plan     1. Other fatigue Very non-specific. She is mainly concerned about the possibility of Lyme disease since symptoms improved while she took amoxicillin.  - Lyme Disease Serology w/Reflex - CBC with Differential/Platelet - TSH - Comprehensive metabolic panel      The entirety of the information documented in the History of Present Illness, Review of Systems and Physical Exam were personally obtained by me. Portions of this information were initially documented by the CMA and reviewed by me for thoroughness and accuracy.     [name], MD  [facility] [phone] (phone) [phone] (fax)  [place]

## 2021-10-18 LAB — CBC WITH DIFFERENTIAL/PLATELET
Basophils Absolute: 0.1 10*3/uL (ref 0.0–0.2)
Basos: 1 %
EOS (ABSOLUTE): 0.1 10*3/uL (ref 0.0–0.4)
Eos: 1 %
Hematocrit: 40.7 % (ref 34.0–46.6)
Hemoglobin: 13.3 g/dL (ref 11.1–15.9)
Immature Grans (Abs): 0 10*3/uL (ref 0.0–0.1)
Immature Granulocytes: 0 %
Lymphocytes Absolute: 1.7 10*3/uL (ref 0.7–3.1)
Lymphs: 28 %
MCH: 31.4 pg (ref 26.6–33.0)
MCHC: 32.7 g/dL (ref 31.5–35.7)
MCV: 96 fL (ref 79–97)
Monocytes Absolute: 0.4 10*3/uL (ref 0.1–0.9)
Monocytes: 7 %
Neutrophils Absolute: 3.8 10*3/uL (ref 1.4–7.0)
Neutrophils: 63 %
Platelets: 289 10*3/uL (ref 150–450)
RBC: 4.24 x10E6/uL (ref 3.77–5.28)
RDW: 12.4 % (ref 11.7–15.4)
WBC: 6.1 10*3/uL (ref 3.4–10.8)

## 2021-10-18 LAB — COMPREHENSIVE METABOLIC PANEL
ALT: 12 IU/L (ref 0–32)
AST: 14 IU/L (ref 0–40)
Albumin/Globulin Ratio: 2.1 (ref 1.2–2.2)
Albumin: 4.4 g/dL (ref 3.7–4.7)
Alkaline Phosphatase: 88 IU/L (ref 44–121)
BUN/Creatinine Ratio: 17 (ref 12–28)
BUN: 18 mg/dL (ref 8–27)
Bilirubin Total: 0.3 mg/dL (ref 0.0–1.2)
CO2: 23 mmol/L (ref 20–29)
Calcium: 9.3 mg/dL (ref 8.7–10.3)
Chloride: 97 mmol/L (ref 96–106)
Creatinine, Ser: 1.07 mg/dL — ABNORMAL HIGH (ref 0.57–1.00)
Globulin, Total: 2.1 g/dL (ref 1.5–4.5)
Glucose: 94 mg/dL (ref 70–99)
Potassium: 4.3 mmol/L (ref 3.5–5.2)
Sodium: 135 mmol/L (ref 134–144)
Total Protein: 6.5 g/dL (ref 6.0–8.5)
eGFR: 52 mL/min/{1.73_m2} — ABNORMAL LOW (ref >59)

## 2021-10-18 LAB — LYME DISEASE SEROLOGY W/REFLEX: Lyme Total Antibody EIA: NEGATIVE

## 2021-10-18 LAB — TSH: TSH: 0.921 u[IU]/mL (ref 0.450–4.500)

## 2021-10-24 ENCOUNTER — Ambulatory Visit: Payer: Medicare HMO | Admitting: Family Medicine

## 2021-10-24 ENCOUNTER — Encounter: Payer: Self-pay | Admitting: Family Medicine

## 2021-10-24 ENCOUNTER — Ambulatory Visit (INDEPENDENT_AMBULATORY_CARE_PROVIDER_SITE_OTHER): Payer: Medicare HMO | Admitting: Family Medicine

## 2021-10-24 VITALS — BP 134/62 | HR 79 | Temp 98.5°F | Resp 16 | Wt 130.0 lb

## 2021-10-24 DIAGNOSIS — N3001 Acute cystitis with hematuria: Secondary | ICD-10-CM | POA: Diagnosis not present

## 2021-10-24 DIAGNOSIS — R300 Dysuria: Secondary | ICD-10-CM

## 2021-10-24 LAB — POCT URINALYSIS DIPSTICK
Bilirubin, UA: NEGATIVE
Glucose, UA: NEGATIVE
Ketones, UA: NEGATIVE
Nitrite, UA: NEGATIVE
Protein, UA: POSITIVE — AB
Spec Grav, UA: 1.01 (ref 1.010–1.025)
Urobilinogen, UA: 0.2 E.U./dL
pH, UA: 7.5 (ref 5.0–8.0)

## 2021-10-24 MED ORDER — CEPHALEXIN 500 MG PO CAPS: 500.0000 mg | ORAL_CAPSULE | Freq: Three times a day (TID) | ORAL | 0 refills | Status: AC

## 2021-10-24 NOTE — Progress Notes (Signed)
I,Roshena L Chambers,acting as a scribe for [name], MD.,have documented all relevant documentation on the behalf of [name], MD,as directed by  [name], MD while in the presence of [name], MD.   Established patient visit   Patient: Laura Love   DOB: 1940-09-20   81 y.o. Female  MRN: [id number] Visit Date: 10/24/2021  Today's healthcare provider: [name], MD   Chief Complaint  Patient presents with   Dysuria   Subjective    Dysuria  This is a new problem. Episode onset: 5-6 days ago. The problem has been gradually worsening. The quality of the pain is described as burning. There has been no fever. Associated symptoms include frequency and nausea. Pertinent negatives include no chills or vomiting. She has tried nothing for the symptoms.      Medications: Outpatient Medications Prior to Visit  Medication Sig   acetaminophen (TYLENOL) 325 MG tablet Take 650 mg by mouth every 6 (six) hours as needed. 1 a day   albuterol (VENTOLIN HFA) 108 (90 Base) MCG/ACT inhaler Inhale 2 puffs into the lungs every 6 (six) hours as needed for wheezing or shortness of breath.   Biotin 1000 MCG tablet Take 1,000 mcg by mouth daily.   buPROPion (WELLBUTRIN SR) 150 MG 12 hr tablet TAKE ONE TABLET TWICE DAILY   CALCIUM PO Take 600 mg by mouth 2 (two) times daily. + D3 800 units   dicyclomine (BENTYL) 10 MG capsule Take 1 capsule (10 mg total) by mouth 4 (four) times daily -  before meals and at bedtime. As needed for abdominal pain   ezetimibe (ZETIA) 10 MG tablet TAKE ONE TABLET EVERY DAY   hyoscyamine (ANASPAZ) 0.125 MG TBDP disintergrating tablet Place 1 tablet (0.125 mg total) under the tongue every 4 (four) hours as needed.   ibuprofen (ADVIL,MOTRIN) 200 MG tablet Take 200 mg by mouth every 6 (six) hours as needed. 1 tablet daily   Melatonin 3 MG TABS Take 1 tablet by mouth at bedtime.   sennosides-docusate sodium (SENOKOT-S) 8.6-50 MG tablet Take 2-3 tablets  by mouth 2 (two) times daily. 3 tablets every morning, then 3-4 tablets in the evenings   SYNTHROID 100 MCG tablet TAKE 1 TABLET BY MOUTH DAILY   vitamin C (ASCORBIC ACID) 500 MG tablet Take 500 mg by mouth daily.   Vitamin D, Cholecalciferol, 1000 UNITS TABS Take 2 tablets by mouth daily.   Zinc 50 MG CAPS Take by mouth.   zinc gluconate 50 MG tablet Take 50 mg by mouth daily as needed.   zoledronic acid (RECLAST) 5 MG/100ML SOLN injection Inject 5 mg into the vein. Once yearly starting June 2019 per Dr. Gabriel Carina   No facility-administered medications prior to visit.    Review of Systems  Constitutional:  Negative for appetite change, chills, fatigue and fever.  Respiratory:  Negative for chest tightness and shortness of breath.   Cardiovascular:  Negative for chest pain and palpitations.  Gastrointestinal:  Positive for nausea. Negative for abdominal pain and vomiting.  Genitourinary:  Positive for dysuria and frequency.  Neurological:  Negative for dizziness and weakness.       Objective    BP 134/62 (BP Location: Left Arm, Patient Position: Sitting, Cuff Size: Normal)   Pulse 79   Temp 98.5 F (36.9 C) (Oral)   Resp 16   Wt 130 lb (59 kg)   SpO2 100% Comment: room air  BMI 23.78 kg/m    Physical Exam  General appearance: Well developed, well nourished female, cooperative and in no acute distress Head: Normocephalic, without obvious abnormality, atraumatic Respiratory: Respirations even and unlabored, normal respiratory rate Extremities: All extremities are intact.  Skin: Skin color, texture, turgor normal. No rashes seen  Psych: Appropriate mood and affect. Neurologic: Mental status: Alert, oriented to person, place, and time, thought content appropriate.   Results for orders placed or performed in visit on 10/24/21  POCT Urinalysis Dipstick  Result Value Ref Range   Color, UA yellow    Clarity, UA cloudy    Glucose, UA Negative Negative   Bilirubin, UA negative     Ketones, UA negative    Spec Grav, UA 1.010 1.010 - 1.025   Blood, UA large (hemolyzed)    pH, UA 7.5 5.0 - 8.0   Protein, UA Positive (A) Negative   Urobilinogen, UA 0.2 0.2 or 1.0 E.U./dL   Nitrite, UA negative    Leukocytes, UA Moderate (2+) (A) Negative   Appearance     Odor      Assessment & Plan     1. Dysuria  2. Acute cystitis with hematuria  - cephALEXin (KEFLEX) 500 MG capsule; Take 1 capsule (500 mg total) by mouth 3 (three) times daily for 7 days.  Dispense: 21 capsule; Refill: 0        The entirety of the information documented in the History of Present Illness, Review of Systems and Physical Exam were personally obtained by me. Portions of this information were initially documented by the CMA and reviewed by me for thoroughness and accuracy.     [name], MD  [facility] [phone] (phone) [phone] (fax)  [place]

## 2021-10-27 ENCOUNTER — Ambulatory Visit: Payer: Medicare HMO | Admitting: Pulmonary Disease

## 2021-10-27 ENCOUNTER — Encounter: Payer: Self-pay | Admitting: Pulmonary Disease

## 2021-10-27 VITALS — BP 126/74 | HR 74 | Temp 98.0°F | Ht 62.0 in | Wt 132.4 lb

## 2021-10-27 DIAGNOSIS — Z72821 Inadequate sleep hygiene: Secondary | ICD-10-CM | POA: Diagnosis not present

## 2021-10-27 DIAGNOSIS — R0602 Shortness of breath: Secondary | ICD-10-CM

## 2021-10-27 DIAGNOSIS — R053 Chronic cough: Secondary | ICD-10-CM | POA: Diagnosis not present

## 2021-10-27 NOTE — Patient Instructions (Signed)
I recommend you stop taking the calcium supplements.  I recommend a supplement with vitamin D3 and K2, there are several brands out there.  An example of this as below:    We are getting breathing tests.  We will see you in follow-up in 2 to 3 months time call sooner should any new problems arise.

## 2021-10-27 NOTE — Progress Notes (Signed)
Subjective:    Patient ID: Laura Love, female    DOB: Jun 22, 1940, 81 y.o.   MRN: [id number] Patient Care Team: Birdie Sons, MD as PCP - General (Family Medicine) Thelma Comp, MD as Referring Physician (Gastroenterology) Mikle Bosworth Margarita Sermons, PA-C as Physician Assistant (Physician Assistant) Oneta Rack, MD (Dermatology) Warnell Forester, NP (Inactive) as Nurse Practitioner (Surgery) Solum, Betsey Holiday, MD as Physician Assistant (Endocrinology) Lonia Farber, MD as Consulting Physician (Internal Medicine) Anell Barr, OD [facility])  Chief Complaint  Patient presents with   pulmonary consult    Per Tally Joe, NP-CXR 08/12/2021. Occ dry cough.     HPI Patient is an 81 year old remote former smoker with minimal tobacco exposure, and a history as noted below, who presents for evaluation of cough, globus sensation and mild dyspnea on heavy exertion.  She is kindly referred by Tally Joe, [place].  The patient notes that around May 2022 she was sick with what appears to have been a viral illness.  This subsequently resolved but she did have some issues with cough at that time.  She notes however that that resolved.  However around May of this year similar situation arose.  She has noted cough that is dry and nonproductive since then.  He states that it is worse with talking or eating.  She states that once she stops talking or eating her cough subsides.  She also notes postnasal drip and a globus sensation.  She has had increased stress since February 2022 with 3 deaths in the family was together first being her daughter then husband and then her mother.  She was the sole caretaker for her husband for 14 years who had difficulties after CABG with infection and disability.  She feels that all of this has left her "drained.  In around July she was prescribed a 10-day course of amoxicillin due to a dental infection and she states that this made her feel much better and her cough  improved as well.  However she has noted this sensation of fatigue and shortness of breath on exertion after finishing the antibiotic.  She has not noted any wheezing.  No hemoptysis.  She has not tried any inhalers for cough or dyspnea.  She has not had any chest pain, orthopnea or paroxysmal nocturnal dyspnea.  No lower extremity edema.  No calf tenderness.  Was have "trouble sleeping" but this is more related to anxiety and insomnia.  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  She has dogs in the home Brittanys and Pointers, she has since retired from the competition world.  She is anticipating a move to [facility].   Review of Systems A 10 point review of systems was performed and it is as noted above otherwise negative.  Past Medical History:  Diagnosis Date   Clavicular fracture    Colon polyps    History of chicken pox    History of measles    History of mumps    Hyperlipidemia    Hypoglycemia    Osteoporosis    Past Surgical History:  Procedure Laterality Date   ABDOMINAL HYSTERECTOMY  1988   for endometriosis   BREAST BIOPSY Right 2004   CLOSED REDUCTION FACIAL FRACTURE     Patient Active Problem List   Diagnosis Date Noted  Chronic bronchitis ([place]) 08/11/2021   Oral phase dysphagia 08/11/2021   Joint pain 09/19/2019   Tick bite 08/14/2019   Pain in right knee 11/08/2017   History of measles, mumps, or rubella 04/10/2017   Allergic rhinitis 04/10/2017   Osteoporosis 04/10/2017   History of adenomatous polyp of colon 12/21/2014   Hypothyroidism 11/08/2010   Hyperlipemia 10/19/2010   Bladder incontinence 10/19/2010   Vaginal prolapse 10/19/2010   Cervical pain 09/22/2009   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   CN (constipation) 07/02/2008   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   Cannot sleep 03/10/2007   Dysthymia 03/10/2007   Family history of breast cancer 03/10/2007   Family History  Problem Relation Age of Onset   Heart attack Father    Leukemia Father    Hyperlipidemia Mother    Hypertension Mother    Breast cancer Mother    Hyperlipidemia Sister    Breast cancer Sister    Hyperlipidemia Brother    Social History   Tobacco Use   Smoking status: Former    Packs/day: 0.50    Years: 7.00    Total pack years: 3.50    Types: Cigarettes    Quit date: 1970    Years since quitting: 54.1   Smokeless tobacco: Never  Substance Use Topics   Alcohol use: No    Alcohol/week: 0.0 standard drinks of alcohol   Allergies  Allergen Reactions   Alendronate Nausea Only   Crestor [Rosuvastatin Calcium]    Cymbalta [Duloxetine Hcl] Other (See Comments)    PUTS PATIENT IN BED FOR TWO DAYS    Duloxetine Nausea Only   Pentazocine Other (See Comments)   Current Meds  Medication Sig   acetaminophen (TYLENOL) 325 MG tablet Take 650 mg by mouth every 6 (six) hours as needed. 1 a day   albuterol (VENTOLIN HFA) 108 (90 Base) MCG/ACT inhaler Inhale 2 puffs into the lungs every 6 (six) hours as needed for wheezing or shortness of breath.   Biotin 1000 MCG tablet Take 1,000 mcg by mouth daily.   buPROPion (WELLBUTRIN SR) 150 MG 12 hr tablet TAKE ONE TABLET TWICE DAILY   CALCIUM PO Take 600 mg by mouth 2 (two) times daily. + D3 800 units   [EXPIRED] cephALEXin (KEFLEX) 500 MG capsule Take 1 capsule (500 mg total) by mouth 3 (three) times daily for 7 days.   dicyclomine (BENTYL) 10 MG capsule Take 1 capsule (10 mg total) by mouth 4 (four) times daily -  before meals and at bedtime. As needed for abdominal pain   ezetimibe (ZETIA) 10 MG tablet TAKE ONE TABLET EVERY DAY   hyoscyamine (ANASPAZ) 0.125 MG TBDP disintergrating tablet Place 1 tablet (0.125 mg total) under the tongue every 4 (four) hours as needed.   ibuprofen (ADVIL,MOTRIN)  200 MG tablet Take 200 mg by mouth every 6 (six) hours as needed. 1 tablet daily   Melatonin 3 MG TABS Take 1 tablet by mouth at bedtime.   sennosides-docusate sodium (SENOKOT-S) 8.6-50 MG tablet Take 2-3 tablets by mouth 2 (two) times daily. 3 tablets every morning, then 3-4 tablets in the evenings   SYNTHROID 100 MCG tablet TAKE 1 TABLET BY MOUTH DAILY   vitamin C (ASCORBIC ACID) 500 MG tablet Take 500 mg by mouth daily.   Vitamin D, Cholecalciferol, 1000 UNITS TABS Take 2 tablets by mouth daily.   Zinc 50 MG CAPS Take by mouth.   zinc gluconate 50 MG tablet  Take 50 mg by mouth daily as needed.   zoledronic acid (RECLAST) 5 MG/100ML SOLN injection Inject 5 mg into the vein. Once yearly starting June 2019 per Dr. Gabriel Carina       Objective:   Physical Exam BP 126/74 (BP Location: Left Arm, Cuff Size: Normal)   Pulse 74   Temp 98 F (36.7 C) (Temporal)   Ht '5\' 2"'$  (1.575 m)   Wt 132 lb 6.4 oz (60.1 kg)   SpO2 97%   BMI 24.22 kg/m   SpO2: 97 % O2 Device: None (Room air)  GENERAL: Well-developed, well-nourished woman, appears much younger than stated age.  Fully ambulatory.  No conversational dyspnea. HEAD: Normocephalic, atraumatic.  EYES: Pupils equal, round, reactive to light.  No scleral icterus.  MOUTH: Dentition intact, mucosa moist.  No thrush. NECK: Supple. No thyromegaly. Trachea midline. No JVD.  No adenopathy. PULMONARY: Good air entry bilaterally.  No adventitious sounds. CARDIOVASCULAR: S1 and S2. Regular rate and rhythm.  No rubs, murmurs or gallops heard. ABDOMEN: Benign. MUSCULOSKELETAL: No joint deformity, no clubbing, no edema.  NEUROLOGIC: No overt focal deficit, no gait disturbance, speech is fluent. SKIN: Intact,warm,dry. PSYCH: Mood and behavior normal.  Independently reviewed chest x-ray from 11 August 2021 showing no active disease:     Assessment & Plan:     ICD-10-CM   1. Shortness of breath  R06.02 Pulmonary Function Test [facility] Only   Obtain PFTs This  is mostly expressed as fatigue    2. Chronic cough  R05.3    Suspect related to postnasal drip Possible LPR Query cough variant asthma    3. Poor sleep hygiene  Z72.821    May account for fatigue Likely related to life stressors     Orders Placed This Encounter  Procedures   Pulmonary Function Test [facility] Only    Next available.    Standing Status:   Future    Number of Occurrences:   1    Standing Expiration Date:   10/28/2022    Order Specific Question:   Full PFT: includes the following: basic spirometry, spirometry pre & post bronchodilator, diffusion capacity (DLCO), lung volumes    Answer:   Full PFT   Recommended to the patient to curtail use of calcium supplements which can also aggravate cough.  Rather increase forms of dietary calcium.  Consider also biofeedback for management of stress.  Will obtain PFTs to clarify patient's symptoms.  Follow-up will be in 2 to 3 months time she is to contact us prior to that time should any new difficulties arise.  Renold Don, MD Advanced Bronchoscopy PCCM  [facility]ulmonary-[place]    *This note was dictated using voice recognition software/Dragon.  Despite best efforts to proofread, errors can occur which can change the meaning. Any transcriptional errors that result from this process are unintentional and may not be fully corrected at the time of dictation.

## 2021-10-31 LAB — URINE CULTURE

## 2021-11-01 ENCOUNTER — Telehealth: Payer: Self-pay | Admitting: Family Medicine

## 2021-11-01 DIAGNOSIS — M25552 Pain in left hip: Secondary | ICD-10-CM

## 2021-11-01 NOTE — Telephone Encounter (Signed)
Referral Request - Has patient seen PCP for this complaint? No.  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

## 2021-11-02 ENCOUNTER — Ambulatory Visit: Payer: Self-pay

## 2021-11-02 NOTE — Telephone Encounter (Signed)
Ok to refer to urology. Can send in another round of cephalexin 500 one tab TID for 7 days.

## 2021-11-02 NOTE — Telephone Encounter (Signed)
Summary: Burning/ trouble urinating   Pt states she is having burning and trouble urinating   Pt states medication prescribed on 8-28 helped but symptoms returned today   Please assist pt further      Chief Complaint: burning urgency and frequency with urination, feels like cannot empty bladder completely  Symptoms: burning Frequency: sx returned today Pertinent Negatives: Patient denies flank pain, fever, abdominal pain, pelvic pain,  Disposition: '[]'$ ED /'[]'$ Urgent Care (no appt availability in office) / '[]'$ Appointment(In office/virtual)/ '[]'$  [facility]/ '[]'$ Home Care/ '[]'$ Refused Recommended Disposition /'[]'$ [facility]/ '[x]'$  Follow-up with PCP Additional Notes: pt stated would like to have urologist consult if agreeable with Dr. Caryn Section. Abx finished Monday. Sx returned today. Taking Azo for discomfort. Routing to office.  Reason for Disposition  Age > 50 years  Answer Assessment - Initial Assessment Questions 1. SEVERITY: "How bad is the pain?"  (e.g., Scale 1-10; mild, moderate, or severe)   - MILD (1-3): complains slightly about urination hurting   - MODERATE (4-7): interferes with normal activities     - SEVERE (8-10): excruciating, unwilling or unable to urinate because of the pain      Moderate- unable to empthy bladder 2. FREQUENCY: "How many times have you had painful urination today?"      This afternoon twice 3. PATTERN: "Is pain present every time you urinate or just sometimes?"      Every time 4. ONSET: "When did the painful urination start?"      11/02/21 5. FEVER: "Do you have a fever?" If Yes, ask: "What is your temperature, how was it measured, and when did it start?"     no 6. PAST UTI: "Have you had a urine infection before?" If Yes, ask: "When was the last time?" and "What happened that time?"      Yes-40 years 7. CAUSE: "What do you think is causing the painful urination?"  (e.g., UTI, scratch, Herpes sore)     UTI 8. OTHER SYMPTOMS: "Do you have  any other symptoms?" (e.g., blood in urine, flank pain, genital sores, urgency, vaginal discharge)     urgency 9. PREGNANCY: "Is there any chance you are pregnant?" "When was your last menstrual period?"     N/a  Protocols used: Urination Pain - Female-A-AH

## 2021-11-02 NOTE — Telephone Encounter (Signed)
Tried calling patient. No answer. OK for [facility] triage to advise.

## 2021-11-03 ENCOUNTER — Other Ambulatory Visit: Payer: Self-pay

## 2021-11-03 DIAGNOSIS — R300 Dysuria: Secondary | ICD-10-CM

## 2021-11-03 MED ORDER — CEPHALEXIN 500 MG PO CAPS: 500.0000 mg | ORAL_CAPSULE | Freq: Three times a day (TID) | ORAL | 0 refills | Status: DC

## 2021-11-03 NOTE — Telephone Encounter (Signed)
Called, spoke with pt. She states that she would like the cephalexin sent to [facility] pharmacy. She also states that she would like the referral to urology. Please advise patient when medication is sent to pharmacy.

## 2021-11-03 NOTE — Telephone Encounter (Signed)
Patient advised that prescription has been sent to pharmacy and referral has been ordered

## 2021-11-09 DIAGNOSIS — G8929 Other chronic pain: Secondary | ICD-10-CM | POA: Diagnosis not present

## 2021-11-09 DIAGNOSIS — M25552 Pain in left hip: Secondary | ICD-10-CM | POA: Diagnosis not present

## 2021-11-09 DIAGNOSIS — M1612 Unilateral primary osteoarthritis, left hip: Secondary | ICD-10-CM | POA: Diagnosis not present

## 2021-11-16 ENCOUNTER — Ambulatory Visit: Payer: Medicare HMO | Admitting: Urology

## 2021-11-16 VITALS — BP 159/73 | HR 73 | Ht 62.0 in | Wt 131.8 lb

## 2021-11-16 DIAGNOSIS — R32 Unspecified urinary incontinence: Secondary | ICD-10-CM

## 2021-11-16 DIAGNOSIS — Z87440 Personal history of urinary (tract) infections: Secondary | ICD-10-CM

## 2021-11-16 DIAGNOSIS — N8110 Cystocele, unspecified: Secondary | ICD-10-CM | POA: Diagnosis not present

## 2021-11-16 DIAGNOSIS — R3129 Other microscopic hematuria: Secondary | ICD-10-CM

## 2021-11-16 DIAGNOSIS — N390 Urinary tract infection, site not specified: Secondary | ICD-10-CM

## 2021-11-16 LAB — URINALYSIS, COMPLETE
Bilirubin, UA: NEGATIVE
Glucose, UA: NEGATIVE
Ketones, UA: NEGATIVE
Leukocytes,UA: NEGATIVE
Nitrite, UA: NEGATIVE
Protein,UA: NEGATIVE
Specific Gravity, UA: 1.03 (ref 1.005–1.030)
Urobilinogen, Ur: 0.2 mg/dL (ref 0.2–1.0)
pH, UA: 5.5 (ref 5.0–7.5)

## 2021-11-16 LAB — MICROSCOPIC EXAMINATION

## 2021-11-16 LAB — BLADDER SCAN AMB NON-IMAGING: Scan Result: 0

## 2021-11-16 NOTE — Progress Notes (Signed)
11/16/2021 1:33 PM   Laura Love [birth year] [id number]  Referring provider: Birdie Sons, [place] [place],  [place] [address]  No chief complaint on file.   HPI: 81 year old female referred for further evaluation of dysuria.  She was seen by her PCP on 10/24/2021 complaining of dysuria.  At that time, a urinary dip showed 2+ leukocytes in her urine and she was treated with Keflex for 7 days for presumed UTI.  She ended up growing Klebsiella.  This was sensitive to Keflex.  She called back to her PCP on 11/02/2021 requesting urology consult.  Her symptoms recurred.  She was also offered a second round of Keflex.  UA and urine culture was not repeated.  She has no other documented UTIs over the past calendar year.  PMH: Past Medical History:  Diagnosis Date   Clavicular fracture    Colon polyps    History of chicken pox    History of measles    History of mumps    Hyperlipidemia    Hypoglycemia    Osteoporosis     Surgical History: Past Surgical History:  Procedure Laterality Date   ABDOMINAL HYSTERECTOMY  1988   for endometriosis   BREAST BIOPSY Right 2004   CLOSED REDUCTION FACIAL FRACTURE      Home Medications:  Allergies as of 11/16/2021       Reactions   Alendronate Nausea Only   Crestor [rosuvastatin Calcium]    Cymbalta [duloxetine Hcl] Other (See Comments)   PUTS PATIENT IN BED FOR TWO DAYS    Duloxetine Nausea Only   Pentazocine Other (See Comments)        Medication List        Accurate as of November 16, 2021  1:33 PM. If you have any questions, ask your nurse or doctor.          acetaminophen 325 MG tablet Commonly known as: TYLENOL Take 650 mg by mouth every 6 (six) hours as needed. 1 a day   albuterol 108 (90 Base) MCG/ACT inhaler Commonly known as: Ventolin HFA Inhale 2 puffs into the lungs every 6 (six) hours as needed for wheezing or shortness of breath.   ascorbic acid 500 MG tablet Commonly known  as: VITAMIN C Take 500 mg by mouth daily.   Biotin 1000 MCG tablet Take 1,000 mcg by mouth daily.   buPROPion 150 MG 12 hr tablet Commonly known as: WELLBUTRIN SR TAKE ONE TABLET TWICE DAILY   CALCIUM PO Take 600 mg by mouth 2 (two) times daily. + D3 800 units   cephALEXin 500 MG capsule Commonly known as: KEFLEX Take 1 capsule (500 mg total) by mouth 3 (three) times daily.   dicyclomine 10 MG capsule Commonly known as: BENTYL Take 1 capsule (10 mg total) by mouth 4 (four) times daily -  before meals and at bedtime. As needed for abdominal pain   ezetimibe 10 MG tablet Commonly known as: ZETIA TAKE ONE TABLET EVERY DAY   hyoscyamine 0.125 MG Tbdp disintergrating tablet Commonly known as: ANASPAZ Place 1 tablet (0.125 mg total) under the tongue every 4 (four) hours as needed.   ibuprofen 200 MG tablet Commonly known as: ADVIL Take 200 mg by mouth every 6 (six) hours as needed. 1 tablet daily   melatonin 3 MG Tabs tablet Take 1 tablet by mouth at bedtime.   sennosides-docusate sodium 8.6-50 MG tablet Commonly known as: SENOKOT-S Take 2-3 tablets by mouth 2 (two) times daily.  3 tablets every morning, then 3-4 tablets in the evenings   Synthroid 100 MCG tablet Generic drug: levothyroxine TAKE 1 TABLET BY MOUTH DAILY   Vitamin D (Cholecalciferol) 25 MCG (1000 UT) Tabs Take 2 tablets by mouth daily.   Zinc 50 MG Caps Take by mouth.   zinc gluconate 50 MG tablet Take 50 mg by mouth daily as needed.   zoledronic acid 5 MG/100ML Soln injection Commonly known as: RECLAST Inject 5 mg into the vein. Once yearly starting June 2019 per Dr. Gabriel Carina        Allergies:  Allergies  Allergen Reactions   Alendronate Nausea Only   Crestor [Rosuvastatin Calcium]    Cymbalta [Duloxetine Hcl] Other (See Comments)    PUTS PATIENT IN BED FOR TWO DAYS    Duloxetine Nausea Only   Pentazocine Other (See Comments)    Family History: Family History  Problem Relation Age of  Onset   Heart attack Father    Leukemia Father    Hyperlipidemia Mother    Hypertension Mother    Breast cancer Mother    Hyperlipidemia Sister    Breast cancer Sister    Hyperlipidemia Brother     Social History:  reports that she quit smoking about 53 years ago. Her smoking use included cigarettes. She has a 3.50 pack-year smoking history. She has never used smokeless tobacco. She reports that she does not drink alcohol and does not use drugs.   Physical Exam: There were no vitals taken for this visit.  Constitutional:  Alert and oriented, No acute distress. HEENT: [place] AT, moist mucus membranes.  Trachea midline, no masses. Cardiovascular: No clubbing, cyanosis, or edema. Respiratory: Normal respiratory effort, no increased work of breathing. GI: Abdomen is soft, nontender, nondistended, no abdominal masses GU: No CVA tenderness Skin: No rashes, bruises or suspicious lesions. Neurologic: Grossly intact, no focal deficits, moving all 4 extremities. Psychiatric: Normal mood and affect.  Laboratory Data: Lab Results  Component Value Date   WBC 6.1 10/17/2021   HGB 13.3 10/17/2021   HCT 40.7 10/17/2021   MCV 96 10/17/2021   PLT 289 10/17/2021    Lab Results  Component Value Date   CREATININE 1.07 (H) 10/17/2021    No results found for: "PSA"  No results found for: "TESTOSTERONE"  No results found for: "HGBA1C"  Urinalysis    Component Value Date/Time   BILIRUBINUR negative 10/24/2021 0937   PROTEINUR Positive (A) 10/24/2021 0937   UROBILINOGEN 0.2 10/24/2021 0937   NITRITE negative 10/24/2021 0937   LEUKOCYTESUR Moderate (2+) (A) 10/24/2021 0937    No results found for: "LABMICR", "WBCUA", "RBCUA", "LABEPIT", "MUCUS", "BACTERIA"  Pertinent Imaging: *** No results found for this or any previous visit.  No results found for this or any previous visit.  No results found for this or any previous visit.  No results found for this or any previous visit.  No  results found for this or any previous visit.  No results found for this or any previous visit.  No results found for this or any previous visit.  No results found for this or any previous visit.   Assessment & Plan:    1. Urinary incontinence, unspecified type ***   No follow-ups on file.  Hollice Espy, MD  [facility] [address] [place], [place] [address] [phone]

## 2021-11-22 LAB — CULTURE, URINE COMPREHENSIVE

## 2021-11-29 ENCOUNTER — Ambulatory Visit: Payer: Medicare HMO | Admitting: Gastroenterology

## 2021-12-12 DIAGNOSIS — D2261 Melanocytic nevi of right upper limb, including shoulder: Secondary | ICD-10-CM | POA: Diagnosis not present

## 2021-12-12 DIAGNOSIS — D2271 Melanocytic nevi of right lower limb, including hip: Secondary | ICD-10-CM | POA: Diagnosis not present

## 2021-12-12 DIAGNOSIS — D2272 Melanocytic nevi of left lower limb, including hip: Secondary | ICD-10-CM | POA: Diagnosis not present

## 2021-12-12 DIAGNOSIS — L821 Other seborrheic keratosis: Secondary | ICD-10-CM | POA: Diagnosis not present

## 2021-12-12 DIAGNOSIS — L570 Actinic keratosis: Secondary | ICD-10-CM | POA: Diagnosis not present

## 2021-12-12 DIAGNOSIS — X32XXXA Exposure to sunlight, initial encounter: Secondary | ICD-10-CM | POA: Diagnosis not present

## 2021-12-12 DIAGNOSIS — D2262 Melanocytic nevi of left upper limb, including shoulder: Secondary | ICD-10-CM | POA: Diagnosis not present

## 2021-12-12 DIAGNOSIS — D225 Melanocytic nevi of trunk: Secondary | ICD-10-CM | POA: Diagnosis not present

## 2021-12-14 DIAGNOSIS — M810 Age-related osteoporosis without current pathological fracture: Secondary | ICD-10-CM | POA: Diagnosis not present

## 2021-12-16 ENCOUNTER — Ambulatory Visit: Payer: Medicare HMO | Admitting: Physician Assistant

## 2021-12-16 VITALS — BP 135/73 | HR 78 | Ht 62.0 in | Wt 130.0 lb

## 2021-12-16 DIAGNOSIS — R3129 Other microscopic hematuria: Secondary | ICD-10-CM

## 2021-12-16 LAB — URINALYSIS, COMPLETE
Bilirubin, UA: NEGATIVE
Glucose, UA: NEGATIVE
Ketones, UA: NEGATIVE
Leukocytes,UA: NEGATIVE
Nitrite, UA: NEGATIVE
Protein,UA: NEGATIVE
RBC, UA: NEGATIVE
Specific Gravity, UA: 1.015 (ref 1.005–1.030)
Urobilinogen, Ur: 0.2 mg/dL (ref 0.2–1.0)
pH, UA: 6.5 (ref 5.0–7.5)

## 2021-12-16 LAB — MICROSCOPIC EXAMINATION: Epithelial Cells (non renal): >10 /hpf — AB (ref 0–10)

## 2021-12-16 NOTE — Patient Instructions (Signed)
The blood in your urine has resolved, great news! You may follow up in our clinic as needed or if you develop more UTIs or see blood in your urine.

## 2021-12-16 NOTE — Progress Notes (Signed)
12/16/2021 1:38 PM   Laura Love Oct 09, 1940 [id number]  CC: Chief Complaint  Patient presents with   Follow-up   HPI: Laura Love is a 81 y.o. female with a recent history of possible recurrent UTI and associated microscopic hematuria as well as vaginal prolapse who presents today for repeat UA.   Today she reports her urinary symptoms have completely resolved and she has no acute concerns.  She thinks that her recent UTI occurred in the setting of prolonged antibiotic use due to some dental issues.  She has been taking probiotics to restore her microbiome.  She declined cath UA today and obtained a sample at home in a sterile cup, which she provided for urinalysis.  In-office UA today pan negative; urine microscopy with >10 epithelial cells/hpf and moderate bacteria.  PMH: Past Medical History:  Diagnosis Date   Clavicular fracture    Colon polyps    History of chicken pox    History of measles    History of mumps    Hyperlipidemia    Hypoglycemia    Osteoporosis     Surgical History: Past Surgical History:  Procedure Laterality Date   ABDOMINAL HYSTERECTOMY  1988   for endometriosis   BREAST BIOPSY Right 2004   CLOSED REDUCTION FACIAL FRACTURE      Home Medications:  Allergies as of 12/16/2021       Reactions   Alendronate Nausea Only   Crestor [rosuvastatin Calcium]    Cymbalta [duloxetine Hcl] Other (See Comments)   PUTS PATIENT IN BED FOR TWO DAYS    Duloxetine Nausea Only   Pentazocine Other (See Comments)        Medication List        Accurate as of December 16, 2021  1:38 PM. If you have any questions, ask your nurse or doctor.          acetaminophen 325 MG tablet Commonly known as: TYLENOL Take 650 mg by mouth every 6 (six) hours as needed. 1 a day   albuterol 108 (90 Base) MCG/ACT inhaler Commonly known as: Ventolin HFA Inhale 2 puffs into the lungs every 6 (six) hours as needed for wheezing or shortness of breath.    ascorbic acid 500 MG tablet Commonly known as: VITAMIN C Take 500 mg by mouth daily.   Biotin 1000 MCG tablet Take 1,000 mcg by mouth daily.   buPROPion 150 MG 12 hr tablet Commonly known as: WELLBUTRIN SR TAKE ONE TABLET TWICE DAILY   CALCIUM PO Take 600 mg by mouth 2 (two) times daily. + D3 800 units   dicyclomine 10 MG capsule Commonly known as: BENTYL Take 1 capsule (10 mg total) by mouth 4 (four) times daily -  before meals and at bedtime. As needed for abdominal pain   ezetimibe 10 MG tablet Commonly known as: ZETIA TAKE ONE TABLET EVERY DAY   hyoscyamine 0.125 MG Tbdp disintergrating tablet Commonly known as: ANASPAZ Place 1 tablet (0.125 mg total) under the tongue every 4 (four) hours as needed.   ibuprofen 200 MG tablet Commonly known as: ADVIL Take 200 mg by mouth every 6 (six) hours as needed. 1 tablet daily   melatonin 3 MG Tabs tablet Take 1 tablet by mouth at bedtime.   sennosides-docusate sodium 8.6-50 MG tablet Commonly known as: SENOKOT-S Take 2-3 tablets by mouth 2 (two) times daily. 3 tablets every morning, then 3-4 tablets in the evenings   Synthroid 100 MCG tablet Generic drug: levothyroxine TAKE 1 TABLET  BY MOUTH DAILY   Vitamin D (Cholecalciferol) 25 MCG (1000 UT) Tabs Take 2 tablets by mouth daily.   Zinc 50 MG Caps Take by mouth.   zinc gluconate 50 MG tablet Take 50 mg by mouth daily as needed.   zoledronic acid 5 MG/100ML Soln injection Commonly known as: RECLAST Inject 5 mg into the vein. Once yearly starting June 2019 per Dr. Gabriel Carina        Allergies:  Allergies  Allergen Reactions   Alendronate Nausea Only   Crestor [Rosuvastatin Calcium]    Cymbalta [Duloxetine Hcl] Other (See Comments)    PUTS PATIENT IN BED FOR TWO DAYS    Duloxetine Nausea Only   Pentazocine Other (See Comments)    Family History: Family History  Problem Relation Age of Onset   Heart attack Father    Leukemia Father    Hyperlipidemia Mother     Hypertension Mother    Breast cancer Mother    Hyperlipidemia Sister    Breast cancer Sister    Hyperlipidemia Brother     Social History:   reports that she quit smoking about 53 years ago. Her smoking use included cigarettes. She has a 3.50 pack-year smoking history. She has never used smokeless tobacco. She reports that she does not drink alcohol and does not use drugs.  Physical Exam: BP 135/73   Pulse 78   Ht '5\' 2"'$  (1.575 m)   Wt 130 lb (59 kg)   BMI 23.78 kg/m   Constitutional:  Alert and oriented, no acute distress, nontoxic appearing HEENT: [place], AT Cardiovascular: No clubbing, cyanosis, or edema Respiratory: Normal respiratory effort, no increased work of breathing Skin: No rashes, bruises or suspicious lesions Neurologic: Grossly intact, no focal deficits, moving all 4 extremities Psychiatric: Normal mood and affect  Laboratory Data: Results for orders placed or performed in visit on 12/16/21  Microscopic Examination   Urine  Result Value Ref Range   WBC, UA 0-5 0 - 5 /hpf   RBC, Urine 0-2 0 - 2 /hpf   Epithelial Cells (non renal) >10 (A) 0 - 10 /hpf   Bacteria, UA Moderate (A) None seen/Few  Urinalysis, Complete  Result Value Ref Range   Specific Gravity, UA 1.015 1.005 - 1.030   pH, UA 6.5 5.0 - 7.5   Color, UA Yellow Yellow   Appearance Ur Hazy (A) Clear   Leukocytes,UA Negative Negative   Protein,UA Negative Negative/Trace   Glucose, UA Negative Negative   Ketones, UA Negative Negative   RBC, UA Negative Negative   Bilirubin, UA Negative Negative   Urobilinogen, Ur 0.2 0.2 - 1.0 mg/dL   Nitrite, UA Negative Negative   Microscopic Examination See below:    Assessment & Plan:   1. Microscopic hematuria Resolved, no further work-up indicated.  UA today is contaminated, which accounts for her bacteriuria.  Agree with probiotics.  Will not send for culture in the absence of pyuria or irritative voiding symptoms.  Okay to follow-up as needed.  We discussed  return precautions including dysuria, flank pain, and gross hematuria.  She is in agreement with this plan. - Urinalysis, Complete  Return if symptoms worsen or fail to improve.  Debroah Loop, PA-C  [facility] [address] [place], [place] [address] [phone]

## 2021-12-26 DIAGNOSIS — M1712 Unilateral primary osteoarthritis, left knee: Secondary | ICD-10-CM | POA: Diagnosis not present

## 2021-12-26 DIAGNOSIS — M1711 Unilateral primary osteoarthritis, right knee: Secondary | ICD-10-CM | POA: Diagnosis not present

## 2021-12-26 DIAGNOSIS — M1612 Unilateral primary osteoarthritis, left hip: Secondary | ICD-10-CM | POA: Diagnosis not present

## 2021-12-27 ENCOUNTER — Ambulatory Visit: Payer: Medicare HMO | Attending: Pulmonary Disease

## 2021-12-27 DIAGNOSIS — R0602 Shortness of breath: Secondary | ICD-10-CM | POA: Insufficient documentation

## 2021-12-27 DIAGNOSIS — F17210 Nicotine dependence, cigarettes, uncomplicated: Secondary | ICD-10-CM | POA: Diagnosis not present

## 2021-12-27 DIAGNOSIS — R69 Illness, unspecified: Secondary | ICD-10-CM | POA: Diagnosis not present

## 2021-12-27 LAB — PULMONARY FUNCTION TEST ARMC ONLY
DL/VA % pred: 64 %
DL/VA: 2.68 ml/min/mmHg/L
DLCO unc % pred: 73 %
DLCO unc: 12.8 ml/min/mmHg
FEF 25-75 Post: 1.71 L/sec
FEF 25-75 Pre: 1.88 L/sec
FEF2575-%Change-Post: -9 %
FEF2575-%Pred-Post: 137 %
FEF2575-%Pred-Pre: 151 %
FEV1-%Change-Post: -2 %
FEV1-%Pred-Post: 124 %
FEV1-%Pred-Pre: 127 %
FEV1-Post: 2.14 L
FEV1-Pre: 2.2 L
FEV1FVC-%Change-Post: -2 %
FEV1FVC-%Pred-Pre: 103 %
FEV6-%Change-Post: 0 %
FEV6-%Pred-Post: 132 %
FEV6-%Pred-Pre: 132 %
FEV6-Post: 2.9 L
FEV6-Pre: 2.89 L
FEV6FVC-%Pred-Post: 106 %
FEV6FVC-%Pred-Pre: 106 %
FVC-%Change-Post: 0 %
FVC-%Pred-Post: 124 %
FVC-%Pred-Pre: 124 %
FVC-Post: 2.9 L
FVC-Pre: 2.89 L
Post FEV1/FVC ratio: 74 %
Post FEV6/FVC ratio: 100 %
Pre FEV1/FVC ratio: 76 %
Pre FEV6/FVC Ratio: 100 %
RV % pred: 58 %
RV: 1.35 L
TLC % pred: 96 %
TLC: 4.58 L

## 2021-12-27 MED ORDER — ALBUTEROL SULFATE (2.5 MG/3ML) 0.083% IN NEBU
2.5000 mg | INHALATION_SOLUTION | Freq: Once | RESPIRATORY_TRACT | Status: AC
  Administered 2021-12-27: 2.5 mg via RESPIRATORY_TRACT

## 2021-12-29 ENCOUNTER — Encounter: Payer: Self-pay | Admitting: Pulmonary Disease

## 2021-12-29 ENCOUNTER — Ambulatory Visit: Payer: Medicare HMO | Admitting: Pulmonary Disease

## 2021-12-29 VITALS — BP 118/70 | HR 68 | Temp 98.0°F | Ht 62.0 in | Wt 133.6 lb

## 2021-12-29 DIAGNOSIS — J301 Allergic rhinitis due to pollen: Secondary | ICD-10-CM

## 2021-12-29 DIAGNOSIS — R053 Chronic cough: Secondary | ICD-10-CM | POA: Diagnosis not present

## 2021-12-29 DIAGNOSIS — R0602 Shortness of breath: Secondary | ICD-10-CM | POA: Diagnosis not present

## 2021-12-29 NOTE — Progress Notes (Signed)
Subjective:    Patient ID: Laura Love, female    DOB: 22-Feb-1941, 81 y.o.   MRN: [id number] Patient Care Team: Malva Limes, MD as PCP - General (Family Medicine) Clint Bolder, MD as Referring Physician (Gastroenterology) Anson [place], PA-C as Physician Assistant (Physician Assistant) Debbrah Alar, MD (Dermatology) Otelia Sergeant, NP (Inactive) as Nurse Practitioner (Surgery) Tedd Sias Marlana Salvage, MD as Physician Assistant (Endocrinology) Sherlon Handing, MD as Consulting Physician (Internal Medicine) Isla Pence, OD [facility])  Chief Complaint  Patient presents with   Follow-up    No SOB, wheezing or cough. PFT 12/27/2021.    HPI Patient is an 81 year old remote former smoker with minimal tobacco exposure, and a history as noted below, who presents for follow-up of cough, globus sensation and mild dyspnea on heavy exertion.  She was initially evaluated on 27 October 2021 and pulmonary function testing was ordered at that time.  Recall that around May 2022 she became ill with what appears to have been a viral illness. This subsequently resolved but she did have some issues with cough at that time. She notes however that that resolved. However around May 2023 a similar situation arose. She has noted cough that is dry and nonproductive since then. He states that it is worse with talking or eating. She states that once she stops talking or eating her cough subsides. She also notes postnasal drip and a globus sensation.  On July 2023 she was prescribed a 10-day course of amoxicillin due to a dental infection and she states that this made her feel much better and her cough improved as well.  Since she completed this antibiotic she has noted marked improvement particularly on postnasal drip.  Is now nonexistent on follow-up today.  She has not noted any wheezing. No hemoptysis. She has not tried any inhalers for cough or dyspnea.  He has not used albuterol inhaler in over a  year.  She has not had any chest pain, orthopnea or paroxysmal nocturnal dyspnea. No lower extremity edema. No calf tenderness.  States that since her visit here in August her symptoms seem to have resolved particularly with much-needed rest.  Recall that at her prior visit she had had 3 deaths in her family and close the session and she felt "drained".  All of this has now resolved.  She has seasonal allergies which respond usually to over-the-counter H2 blocker.  She had pulmonary function testing on 27 December 2021 and these were normal.    DATA 12/27/2021 PFTs: FEV1 2.20 L or 127% predicted, FVC 2.89 L or 124% predicted, FEV1/FVC 76%, no bronchodilator response.  Lung volumes were normal.  Diffusion capacity measurements were invalid due to error code received during the testing.  Overall normal study.  Review of Systems A 10 point review of systems was performed and it is as noted above otherwise negative.  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   CN (constipation) 07/02/2008   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   Cannot sleep 03/10/2007   Dysthymia 03/10/2007   Family history of breast cancer 03/10/2007   Social History   Tobacco Use   Smoking status: Former    Packs/day: 0.50    Years: 7.00    Additional pack years: 0.00    Total pack years: 3.50     Types: Cigarettes    Quit date: 1970    Years since quitting: 54.3   Smokeless tobacco: Never  Substance Use Topics   Alcohol use: No    Alcohol/week: 0.0 standard drinks of alcohol   Allergies  Allergen Reactions   Alendronate Nausea Only   Crestor [Rosuvastatin Calcium]    Cymbalta [Duloxetine Hcl] Other (See Comments)    PUTS PATIENT IN BED FOR TWO DAYS    Duloxetine Nausea Only   Metoprolol     Confusion   Pentazocine Other (See Comments)   Current Meds  Medication Sig   acetaminophen (TYLENOL) 325 MG tablet Take 650 mg by mouth every 6 (six) hours as needed. 1 a day   albuterol (VENTOLIN HFA) 108 (90 Base) MCG/ACT inhaler Inhale 2 puffs into the lungs every 6 (six) hours as needed for wheezing or shortness of breath.   Biotin 1000 MCG tablet Take 1,000 mcg by mouth daily.   buPROPion (WELLBUTRIN SR) 150 MG 12 hr tablet TAKE ONE TABLET TWICE DAILY   CALCIUM PO Take 600 mg by mouth 2 (two) times daily. + D3 800 units   dicyclomine (BENTYL) 10 MG capsule Take 1 capsule (10 mg total) by mouth 4 (four) times daily -  before meals and at bedtime. As needed for abdominal pain   ezetimibe (ZETIA) 10 MG tablet TAKE ONE TABLET EVERY DAY   ibuprofen (ADVIL,MOTRIN) 200 MG tablet Take 200 mg by mouth every 6 (six) hours as needed. 1 tablet daily   Melatonin 3 MG TABS Take 1 tablet by mouth at bedtime.   sennosides-docusate sodium (SENOKOT-S) 8.6-50 MG tablet Take 2-3 tablets by mouth 2 (two) times daily. 3 tablets every morning, then 3-4 tablets in the evenings   SYNTHROID 100 MCG tablet TAKE 1 TABLET BY MOUTH DAILY   vitamin C (ASCORBIC ACID) 500 MG tablet Take 500 mg by mouth daily.   Vitamin D, Cholecalciferol, 1000 UNITS TABS Take 2 tablets by mouth daily.   Zinc 50 MG CAPS Take by mouth.   zinc gluconate 50 MG tablet Take 50 mg by mouth daily as needed.   zoledronic acid (RECLAST) 5 MG/100ML SOLN injection Inject 5 mg into the vein. Once yearly starting June 2019 per Dr. Tedd Sias    [DISCONTINUED] hyoscyamine (ANASPAZ) 0.125 MG TBDP disintergrating tablet Place 1 tablet (0.125 mg total) under the tongue every 4 (four) hours as needed.   Immunization History  Administered Date(s) Administered   Fluad Quad(high Dose 65+) 11/05/2018, 11/27/2021   H1N1 12/21/2007   Influenza Split 12/30/2008, 12/11/2009, 12/22/2010   Influenza, High Dose Seasonal PF 12/30/2014, 12/02/2015, 10/25/2016, 11/20/2017, 10/29/2019, 12/01/2020   Influenza,inj,quad, With Preservative 11/27/2016   PFIZER Comirnaty(Gray Top)Covid-19 Tri-Sucrose Vaccine 04/02/2019, 04/23/2019, 11/27/2019   Pneumococcal Conjugate-13 12/02/2015   Pneumococcal Polysaccharide-23 01/03/2008   Td 05/06/2007   Tdap 11/17/2011       Objective:   Physical Exam BP 118/70 (BP Location: Left Arm, Cuff Size: Normal)   Pulse 68   Temp 98 F (36.7 C)   Ht 5\' 2"  (1.575  m)   Wt 133 lb 9.6 oz (60.6 kg)   SpO2 95%   BMI 24.44 kg/m   SpO2: 95 % O2 Device: None (Room air)  GENERAL: Well-developed, well-nourished woman, appears much younger than stated age.  Fully ambulatory.  No conversational dyspnea. HEAD: Normocephalic, atraumatic.  EYES: Pupils equal, round, reactive to light.  No scleral icterus.  MOUTH: Dentition intact, mucosa moist.  No thrush. NECK: Supple. No thyromegaly. Trachea midline. No JVD.  No adenopathy. PULMONARY: Good air entry bilaterally.  No adventitious sounds. CARDIOVASCULAR: S1 and S2. Regular rate and rhythm.  No rubs, murmurs or gallops heard. ABDOMEN: Benign. MUSCULOSKELETAL: No joint deformity, no clubbing, no edema.  NEUROLOGIC: No overt focal deficit, no gait disturbance, speech is fluent. SKIN: Intact,warm,dry. PSYCH: Mood and behavior normal.     Assessment & Plan:     ICD-10-CM   1. Shortness of breath  R06.02    Resolved PFTs normal Chest x-ray normal    2. Chronic cough  R05.3    RESOLVED Likely related to upper airway cough syndrome    3. Seasonal allergic rhinitis due  to pollen  J30.1    Zyrtec as needed     Patient has resolved with her issues with cough.  We will see her back on an as-needed basis.  Gailen Shelter, MD Advanced Bronchoscopy P[facility]    *This note was dictated using voice recognition software/Dragon.  Despite best efforts to proofread, errors can occur which can change the meaning. Any transcriptional errors that result from this process are unintentional and may not be fully corrected at the time of dictation.

## 2021-12-29 NOTE — Patient Instructions (Signed)
Your breathing test were normal.  If you develop issues with postnasal drip and cough you can use some Zyrtec at nighttime.  We will see you back on an as-needed basis.

## 2022-01-04 ENCOUNTER — Encounter: Payer: Self-pay | Admitting: Family Medicine

## 2022-01-04 ENCOUNTER — Ambulatory Visit (INDEPENDENT_AMBULATORY_CARE_PROVIDER_SITE_OTHER): Payer: Medicare HMO | Admitting: Family Medicine

## 2022-01-04 VITALS — BP 144/64 | HR 72 | Resp 16 | Ht 62.0 in | Wt 132.0 lb

## 2022-01-04 DIAGNOSIS — M1612 Unilateral primary osteoarthritis, left hip: Secondary | ICD-10-CM

## 2022-01-04 DIAGNOSIS — H9113 Presbycusis, bilateral: Secondary | ICD-10-CM | POA: Diagnosis not present

## 2022-01-04 DIAGNOSIS — M5412 Radiculopathy, cervical region: Secondary | ICD-10-CM | POA: Diagnosis not present

## 2022-01-04 NOTE — Patient Instructions (Signed)
.   Please review the attached list of medications and notify my office if there are any errors.   . Please bring all of your medications to every appointment so we can make sure that our medication list is the same as yours.   

## 2022-01-04 NOTE — Progress Notes (Signed)
I,Tiffany J Bragg,acting as a scribe for [name], MD.,have documented all relevant documentation on the behalf of [name], MD,as directed by  [name], MD while in the presence of [name], MD.   Established patient visit   Patient: Laura Love   DOB: 1940-06-30   81 y.o. Female  MRN: [id number] Visit Date: 01/04/2022  Today's healthcare provider: [name], MD    Subjective    HPI Patient reports chronic pain in hips, L>R and states [place] said she needed a hip replacement. She would like a second opinion from a [facility] provider in [place]. Dr. Avon Gully. States she also wants a scooter ordered. Her walking is limited due to hip pain and has difficulty with ADLs such as grocery shopping. Patient also complains of R arm going to sleep when she lays down at night or works on her computer.   Medications: Outpatient Medications Prior to Visit  Medication Sig   acetaminophen (TYLENOL) 325 MG tablet Take 650 mg by mouth every 6 (six) hours as needed. 1 a day   albuterol (VENTOLIN HFA) 108 (90 Base) MCG/ACT inhaler Inhale 2 puffs into the lungs every 6 (six) hours as needed for wheezing or shortness of breath.   Biotin 1000 MCG tablet Take 1,000 mcg by mouth daily.   buPROPion (WELLBUTRIN SR) 150 MG 12 hr tablet TAKE ONE TABLET TWICE DAILY   CALCIUM PO Take 600 mg by mouth 2 (two) times daily. + D3 800 units   dicyclomine (BENTYL) 10 MG capsule Take 1 capsule (10 mg total) by mouth 4 (four) times daily -  before meals and at bedtime. As needed for abdominal pain   ezetimibe (ZETIA) 10 MG tablet TAKE ONE TABLET EVERY DAY   hyoscyamine (ANASPAZ) 0.125 MG TBDP disintergrating tablet Place 1 tablet (0.125 mg total) under the tongue every 4 (four) hours as needed.   ibuprofen (ADVIL,MOTRIN) 200 MG tablet Take 200 mg by mouth every 6 (six) hours as needed. 1 tablet daily   Melatonin 3 MG TABS Take 1 tablet by mouth at bedtime.    sennosides-docusate sodium (SENOKOT-S) 8.6-50 MG tablet Take 2-3 tablets by mouth 2 (two) times daily. 3 tablets every morning, then 3-4 tablets in the evenings   SYNTHROID 100 MCG tablet TAKE 1 TABLET BY MOUTH DAILY   vitamin C (ASCORBIC ACID) 500 MG tablet Take 500 mg by mouth daily.   Vitamin D, Cholecalciferol, 1000 UNITS TABS Take 2 tablets by mouth daily.   Zinc 50 MG CAPS Take by mouth.   zinc gluconate 50 MG tablet Take 50 mg by mouth daily as needed.   zoledronic acid (RECLAST) 5 MG/100ML SOLN injection Inject 5 mg into the vein. Once yearly starting June 2019 per Dr. Gabriel Carina   No facility-administered medications prior to visit.    Review of Systems     Objective    BP (!) 144/64 (BP Location: Left Arm, Patient Position: Sitting, Cuff Size: Normal)   Pulse 72   Resp 16   Ht '5\' 2"'$  (1.575 m)   Wt 132 lb (59.9 kg)   BMI 24.14 kg/m    Physical Exam  General appearance: Well developed, well nourished female, cooperative and in no acute distress Head: Normocephalic, without obvious abnormality, atraumatic. Diminished hearing bilaterally Respiratory: Respirations even and unlabored, normal respiratory rate Extremities: All extremities are intact.  Skin: Skin color, texture, turgor normal. No rashes seen  Psych: Appropriate mood and affect. Neurologic: Mental status: Alert, oriented  to person, place, and time, thought content appropriate.   Assessment & Plan     1. Primary osteoarthritis of left hip  - Ambulatory referral to Orthopedic Surgery, Dr. Mardelle Matte Would benefit from use of motorized scooter due to significant limits on ADLs   2. Cervical radiculopathy   3. Presbycusis of both ears       The entirety of the information documented in the History of Present Illness, Review of Systems and Physical Exam were personally obtained by me. Portions of this information were initially documented by the CMA and reviewed by me for thoroughness and accuracy.     [name], MD  [facility] [phone] (phone) [phone] (fax)  [place]

## 2022-01-06 DIAGNOSIS — M1612 Unilateral primary osteoarthritis, left hip: Secondary | ICD-10-CM | POA: Diagnosis not present

## 2022-01-06 DIAGNOSIS — M545 Low back pain, unspecified: Secondary | ICD-10-CM | POA: Diagnosis not present

## 2022-01-09 ENCOUNTER — Telehealth: Payer: Self-pay | Admitting: Family Medicine

## 2022-01-09 DIAGNOSIS — M1612 Unilateral primary osteoarthritis, left hip: Secondary | ICD-10-CM

## 2022-01-09 NOTE — Telephone Encounter (Signed)
Pt is calling to request a second opion to Dr. Marry Guan with [phone]

## 2022-01-12 NOTE — Telephone Encounter (Signed)
Heather called from [place] [facility] with questions for the clinic regarding this referral please advise   Best contact: [phone]

## 2022-02-01 DIAGNOSIS — M1711 Unilateral primary osteoarthritis, right knee: Secondary | ICD-10-CM | POA: Diagnosis not present

## 2022-02-01 DIAGNOSIS — M1712 Unilateral primary osteoarthritis, left knee: Secondary | ICD-10-CM | POA: Diagnosis not present

## 2022-02-07 DIAGNOSIS — M170 Bilateral primary osteoarthritis of knee: Secondary | ICD-10-CM | POA: Diagnosis not present

## 2022-02-08 DIAGNOSIS — M170 Bilateral primary osteoarthritis of knee: Secondary | ICD-10-CM | POA: Diagnosis not present

## 2022-02-14 DIAGNOSIS — M170 Bilateral primary osteoarthritis of knee: Secondary | ICD-10-CM | POA: Diagnosis not present

## 2022-03-23 DIAGNOSIS — M1612 Unilateral primary osteoarthritis, left hip: Secondary | ICD-10-CM | POA: Diagnosis not present

## 2022-04-22 ENCOUNTER — Encounter: Payer: Self-pay | Admitting: Pulmonary Disease

## 2022-05-11 NOTE — Progress Notes (Signed)
I,Sha'taria Tyson,acting as a Education administrator for [name], MD.,have documented all relevant documentation on the behalf of [name], MD,as directed by  [name], MD while in the presence of [name], MD.   Established patient visit   Patient: Laura Love   DOB: 1940/11/20   82 y.o. Female  MRN: [id number] Visit Date: 05/12/2022  Today's healthcare provider: [name], MD   No chief complaint on file.  Subjective    HPI  -Hip Replacement scheduled for May 20,2024 with Dr.Hooten -Patient is moving to [place] on the 25th -Patient reports derm appt sometime this summer and would like for provider to take a look at the spot in the mean time to see. It is located on the inner left lower leg. Patient reports noticing it for the last 5-6 months and it is getting darker and she can feel it a little more.   Anxiety, Follow-up  She was last seen for anxiety 1 years ago. Changes made at last visit include continue current medication.  She feels her anxiety is mild and Improved since last visit.  GAD-7 Results     No data to display          PHQ-9 Scores    01/04/2022   10:49 AM 08/11/2021    2:24 PM 05/05/2021   11:13 AM  PHQ9 SCORE ONLY  PHQ-9 Total Score 0 0 8   Hypothyroid, follow-up  Lab Results  Component Value Date   TSH 0.921 10/17/2021   TSH 1.13 08/17/2021   TSH 2.860 12/27/2020    Wt Readings from Last 3 Encounters:  01/04/22 132 lb (59.9 kg)  12/29/21 133 lb 9.6 oz (60.6 kg)  12/16/21 130 lb (59 kg)    She was last seen for hypothyroid 10 months ago.  Management since that visit includes continue current medications.  Symptoms: No change in energy level No constipation  No diarrhea No heat / cold intolerance  No nervousness No palpitations  No weight changes    -----------------------------------------------------------------------------------------  Medications: Outpatient Medications Prior to Visit  Medication Sig    acetaminophen (TYLENOL) 325 MG tablet Take 650 mg by mouth every 6 (six) hours as needed. 1 a day   albuterol (VENTOLIN HFA) 108 (90 Base) MCG/ACT inhaler Inhale 2 puffs into the lungs every 6 (six) hours as needed for wheezing or shortness of breath.   Biotin 1000 MCG tablet Take 1,000 mcg by mouth daily.   buPROPion (WELLBUTRIN SR) 150 MG 12 hr tablet TAKE ONE TABLET TWICE DAILY   CALCIUM PO Take 600 mg by mouth 2 (two) times daily. + D3 800 units   dicyclomine (BENTYL) 10 MG capsule Take 1 capsule (10 mg total) by mouth 4 (four) times daily -  before meals and at bedtime. As needed for abdominal pain   ezetimibe (ZETIA) 10 MG tablet TAKE ONE TABLET EVERY DAY   hyoscyamine (ANASPAZ) 0.125 MG TBDP disintergrating tablet Place 1 tablet (0.125 mg total) under the tongue every 4 (four) hours as needed.   ibuprofen (ADVIL,MOTRIN) 200 MG tablet Take 200 mg by mouth every 6 (six) hours as needed. 1 tablet daily   Melatonin 3 MG TABS Take 1 tablet by mouth at bedtime.   sennosides-docusate sodium (SENOKOT-S) 8.6-50 MG tablet Take 2-3 tablets by mouth 2 (two) times daily. 3 tablets every morning, then 3-4 tablets in the evenings   SYNTHROID 100 MCG tablet TAKE 1 TABLET BY MOUTH DAILY   vitamin C (ASCORBIC ACID) 500  MG tablet Take 500 mg by mouth daily.   Vitamin D, Cholecalciferol, 1000 UNITS TABS Take 2 tablets by mouth daily.   Zinc 50 MG CAPS Take by mouth.   zinc gluconate 50 MG tablet Take 50 mg by mouth daily as needed.   zoledronic acid (RECLAST) 5 MG/100ML SOLN injection Inject 5 mg into the vein. Once yearly starting June 2019 per Dr. Gabriel Carina   No facility-administered medications prior to visit.    Review of Systems  Constitutional:  Negative for appetite change, chills, fatigue and fever.  Respiratory:  Negative for chest tightness and shortness of breath.   Cardiovascular:  Negative for chest pain and palpitations.  Gastrointestinal:  Negative for abdominal pain, nausea and vomiting.   Neurological:  Negative for dizziness and weakness.       Objective    BP (!) 169/64 (BP Location: Left Arm, Patient Position: Sitting, Cuff Size: Normal)   Pulse 68   Wt 141 lb (64 kg)   SpO2 100%   BMI 25.79 kg/m    Physical Exam   About 1cm well circumscribed evenly pigments scaly macular lesion left lower leg c/w seborrheic keratosis.   Assessment & Plan     1. Anxiety Doing well current medications. Anticipating moving into [facility] in the coming weeks in preparation for total hip replacement by Dr. Marry Guan in May.   2. Other irritable bowel syndrome Well controlled with occasional hyoscyamine (ANASPAZ) 0.125 MG TBDP disintergrating tablet; Place 1 tablet (0.125 mg total) under the tongue every 4 (four) hours as needed.  Dispense: 30 tablet; Refill: 3  3. Seborrheic keratoses Reassurance. She will also have her dermatologist recheck lesion at next appointment.   4. Hypothyroidism, unspecified type  - TSH  5. Hyperlipidemia, unspecified hyperlipidemia type Doing well with ezetimibe  - CBC - Comprehensive metabolic panel - Lipid panel  6. Osteoporosis, unspecified osteoporosis type, unspecified pathological fracture presence On Reclast - VITAMIN D 25 Hydroxy (Vit-D Deficiency, Fractures)      The entirety of the information documented in the History of Present Illness, Review of Systems and Physical Exam were personally obtained by me. Portions of this information were initially documented by the CMA and reviewed by me for thoroughness and accuracy.     [name], MD  [place] [phone] (phone) [phone] (fax)  [place]

## 2022-05-12 ENCOUNTER — Ambulatory Visit (INDEPENDENT_AMBULATORY_CARE_PROVIDER_SITE_OTHER): Payer: Medicare HMO | Admitting: Family Medicine

## 2022-05-12 ENCOUNTER — Encounter: Payer: Self-pay | Admitting: Family Medicine

## 2022-05-12 VITALS — BP 169/64 | HR 68 | Wt 141.0 lb

## 2022-05-12 DIAGNOSIS — K588 Other irritable bowel syndrome: Secondary | ICD-10-CM

## 2022-05-12 DIAGNOSIS — F419 Anxiety disorder, unspecified: Secondary | ICD-10-CM | POA: Diagnosis not present

## 2022-05-12 DIAGNOSIS — L821 Other seborrheic keratosis: Secondary | ICD-10-CM

## 2022-05-12 DIAGNOSIS — E785 Hyperlipidemia, unspecified: Secondary | ICD-10-CM | POA: Diagnosis not present

## 2022-05-12 DIAGNOSIS — M810 Age-related osteoporosis without current pathological fracture: Secondary | ICD-10-CM

## 2022-05-12 DIAGNOSIS — E039 Hypothyroidism, unspecified: Secondary | ICD-10-CM | POA: Diagnosis not present

## 2022-05-12 DIAGNOSIS — R69 Illness, unspecified: Secondary | ICD-10-CM | POA: Diagnosis not present

## 2022-05-12 MED ORDER — HYOSCYAMINE SULFATE 0.125 MG PO TBDP: 0.1250 mg | ORAL_TABLET | ORAL | 3 refills | Status: DC | PRN

## 2022-05-15 ENCOUNTER — Ambulatory Visit: Payer: Self-pay | Admitting: *Deleted

## 2022-05-15 ENCOUNTER — Encounter: Payer: Self-pay | Admitting: Family Medicine

## 2022-05-15 ENCOUNTER — Ambulatory Visit (INDEPENDENT_AMBULATORY_CARE_PROVIDER_SITE_OTHER): Payer: Medicare HMO | Admitting: Family Medicine

## 2022-05-15 VITALS — BP 181/92 | HR 70 | Wt 132.2 lb

## 2022-05-15 DIAGNOSIS — R69 Illness, unspecified: Secondary | ICD-10-CM | POA: Diagnosis not present

## 2022-05-15 DIAGNOSIS — F419 Anxiety disorder, unspecified: Secondary | ICD-10-CM

## 2022-05-15 DIAGNOSIS — I10 Essential (primary) hypertension: Secondary | ICD-10-CM | POA: Diagnosis not present

## 2022-05-15 MED ORDER — AMLODIPINE BESYLATE 2.5 MG PO TABS: 2.5000 mg | ORAL_TABLET | Freq: Every day | ORAL | 1 refills | Status: DC

## 2022-05-15 NOTE — Telephone Encounter (Signed)
  Chief Complaint: Hypertension Symptoms: BP trending up, yesterday 181/80 ,167/77 this AM. Mild headaches Frequency: "Been monitoring for a while." Pertinent Negatives: Patient denies dizziness, visual changes, weakness Disposition: [] ED /[] Urgent Care (no appt availability in office) / [x] Appointment(In office/virtual)/ []  [place] [facility]/ [] Home Care/ [] Refused Recommended Disposition /[] [place] [facility]/ []  Follow-up with PCP Additional Notes: Pt states she would like to be placed on medication. Secured appt for today. Care advise provided, pt verbalizes understanding.  Reason for Disposition  Systolic BP  >= 99991111 OR Diastolic >= A999333  Answer Assessment - Initial Assessment Questions 1. BLOOD PRESSURE: "What is the blood pressure?" "Did you take at least two measurements 5 minutes apart?"     181/80  Yesterday and Saturday     This Am 167/77 2. ONSET: "When did you take your blood pressure?"     Yesterday, "Monitoring for a while." 3. HOW: "How did you take your blood pressure?" (e.g., automatic home BP monitor, visiting nurse)     Home monitor 4. HISTORY: "Do you have a history of high blood pressure?"     No 5. MEDICINES: "Are you taking any medicines for blood pressure?" "Have you missed any doses recently?"     No 6. OTHER SYMPTOMS: "Do you have any symptoms?" (e.g., blurred vision, chest pain, difficulty breathing, headache, weakness)     Mild headaches, takes IBU for hip pain  Protocols used: Blood Pressure - High-A-AH

## 2022-05-15 NOTE — Progress Notes (Signed)
I,Sha'taria Tyson,acting as a Education administrator for [name], MD.,have documented all relevant documentation on the behalf of [name], MD,as directed by  [name], MD while in the presence of [name], MD.   Established patient visit   Patient: Laura Love   DOB: 1940-09-09   82 y.o. Female  MRN: [id number] Visit Date: 05/15/2022  Today's healthcare provider: [name], MD    Subjective    HPI  Patient is being seen due to concern of high blood pressure. She was last seen in office on 05/12/22 with elevated blood pressure reading of 169/64. Patient is not currently taking any presccribed medication for elevated readings. Patient has been checking BP with old wrist cuff at home with much higher SBP readings into the 180s and 190s. She is preparing to move [facility] which has been a bit stressful.   Medications: Outpatient Medications Prior to Visit  Medication Sig   acetaminophen (TYLENOL) 325 MG tablet Take 650 mg by mouth every 6 (six) hours as needed. 1 a day   albuterol (VENTOLIN HFA) 108 (90 Base) MCG/ACT inhaler Inhale 2 puffs into the lungs every 6 (six) hours as needed for wheezing or shortness of breath.   Biotin 1000 MCG tablet Take 1,000 mcg by mouth daily.   buPROPion (WELLBUTRIN SR) 150 MG 12 hr tablet TAKE ONE TABLET TWICE DAILY   CALCIUM PO Take 600 mg by mouth 2 (two) times daily. + D3 800 units   dicyclomine (BENTYL) 10 MG capsule Take 1 capsule (10 mg total) by mouth 4 (four) times daily -  before meals and at bedtime. As needed for abdominal pain   ezetimibe (ZETIA) 10 MG tablet TAKE ONE TABLET EVERY DAY   hyoscyamine (ANASPAZ) 0.125 MG TBDP disintergrating tablet Place 1 tablet (0.125 mg total) under the tongue every 4 (four) hours as needed.   ibuprofen (ADVIL,MOTRIN) 200 MG tablet Take 200 mg by mouth every 6 (six) hours as needed. 1 tablet daily   Melatonin 3 MG TABS Take 1 tablet by mouth at bedtime.   sennosides-docusate sodium (SENOKOT-S)  8.6-50 MG tablet Take 2-3 tablets by mouth 2 (two) times daily. 3 tablets every morning, then 3-4 tablets in the evenings   SYNTHROID 100 MCG tablet TAKE 1 TABLET BY MOUTH DAILY   vitamin C (ASCORBIC ACID) 500 MG tablet Take 500 mg by mouth daily.   Vitamin D, Cholecalciferol, 1000 UNITS TABS Take 2 tablets by mouth daily.   Zinc 50 MG CAPS Take by mouth.   zinc gluconate 50 MG tablet Take 50 mg by mouth daily as needed.   zoledronic acid (RECLAST) 5 MG/100ML SOLN injection Inject 5 mg into the vein. Once yearly starting June 2019 per Dr. Gabriel Carina   No facility-administered medications prior to visit.         Objective    BP (!) 181/92 Comment: home cuff  Pulse 70   Wt 132 lb 3.2 oz (60 kg)   SpO2 100%   BMI 24.18 kg/m      Assessment & Plan     1. Primary hypertension Start amLODipine (NORVASC) 2.5 MG tablet; Take 1 tablet (2.5 mg total) by mouth daily.  Dispense: 30 tablet; Refill: 1   Consider adding hctz of losartan after labs ordered last week are completely.   Recommend she check home blood pressure with automatic brachial cuff.   2. Anxiety Likely aggravating blood pressure. Should improve after getting moved into [facility]  The entirety of the information documented in the History of Present Illness, Review of Systems and Physical Exam were personally obtained by me. Portions of this information were initially documented by the CMA and reviewed by me for thoroughness and accuracy.     [name], MD  [place] [phone] (phone) [phone] (fax)  [place]

## 2022-05-17 DIAGNOSIS — M810 Age-related osteoporosis without current pathological fracture: Secondary | ICD-10-CM | POA: Diagnosis not present

## 2022-05-17 DIAGNOSIS — E039 Hypothyroidism, unspecified: Secondary | ICD-10-CM | POA: Diagnosis not present

## 2022-05-17 DIAGNOSIS — E785 Hyperlipidemia, unspecified: Secondary | ICD-10-CM | POA: Diagnosis not present

## 2022-05-18 ENCOUNTER — Ambulatory Visit: Payer: Self-pay | Admitting: *Deleted

## 2022-05-18 LAB — COMPREHENSIVE METABOLIC PANEL
ALT: 10 IU/L (ref 0–32)
AST: 16 IU/L (ref 0–40)
Albumin/Globulin Ratio: 2.3 — ABNORMAL HIGH (ref 1.2–2.2)
Albumin: 4.4 g/dL (ref 3.7–4.7)
Alkaline Phosphatase: 105 IU/L (ref 44–121)
BUN/Creatinine Ratio: 14 (ref 12–28)
BUN: 11 mg/dL (ref 8–27)
Bilirubin Total: 0.5 mg/dL (ref 0.0–1.2)
CO2: 25 mmol/L (ref 20–29)
Calcium: 9.3 mg/dL (ref 8.7–10.3)
Chloride: 100 mmol/L (ref 96–106)
Creatinine, Ser: 0.81 mg/dL (ref 0.57–1.00)
Globulin, Total: 1.9 g/dL (ref 1.5–4.5)
Glucose: 93 mg/dL (ref 70–99)
Potassium: 4.1 mmol/L (ref 3.5–5.2)
Sodium: 139 mmol/L (ref 134–144)
Total Protein: 6.3 g/dL (ref 6.0–8.5)
eGFR: 73 mL/min/{1.73_m2} (ref >59)

## 2022-05-18 LAB — CBC
Hematocrit: 40.2 % (ref 34.0–46.6)
Hemoglobin: 13.6 g/dL (ref 11.1–15.9)
MCH: 32.7 pg (ref 26.6–33.0)
MCHC: 33.8 g/dL (ref 31.5–35.7)
MCV: 97 fL (ref 79–97)
Platelets: 240 10*3/uL (ref 150–450)
RBC: 4.16 x10E6/uL (ref 3.77–5.28)
RDW: 11.8 % (ref 11.7–15.4)
WBC: 4.5 10*3/uL (ref 3.4–10.8)

## 2022-05-18 LAB — LIPID PANEL
Chol/HDL Ratio: 2.9 ratio (ref 0.0–4.4)
Cholesterol, Total: 215 mg/dL — ABNORMAL HIGH (ref 100–199)
HDL: 75 mg/dL (ref >39)
LDL Chol Calc (NIH): 126 mg/dL — ABNORMAL HIGH (ref 0–99)
Triglycerides: 80 mg/dL (ref 0–149)
VLDL Cholesterol Cal: 14 mg/dL (ref 5–40)

## 2022-05-18 LAB — VITAMIN D 25 HYDROXY (VIT D DEFICIENCY, FRACTURES): Vit D, 25-Hydroxy: 42.8 ng/mL (ref 30.0–100.0)

## 2022-05-18 LAB — TSH: TSH: 2.83 u[IU]/mL (ref 0.450–4.500)

## 2022-05-18 NOTE — Telephone Encounter (Signed)
She can take 2 x 2.5mg  amlodipine every day

## 2022-05-18 NOTE — Telephone Encounter (Signed)
Pt states that she seen her PCP last week and found out that her blood pressure was up and PCP started her on amLODipine (NORVASC) 2.5 MG tablet. Pt states that his is the lowest dose. Pt states that her blood pressure spiked yesterday evening around 6pm (186/73) and her blood pressure machine turned red and showed emergency. Pt called her friend that takes the same medication and she told her to take another pill. Pt did and when she took her blood pressure this morning it was normal. Pt is wanting to know if her dosage needs to be upped. Please call pt back.     Chief Complaint: elevated BP and medication increase requested  Symptoms: yesterday reports just starting medication norvasc 2.5 mg daily . BP 160/84  and rechecked at 10  pm for 186/88 . Patient reports taking extra dose of Norvasc and at 0230 BP . Then rechecked for 119/55 and again for 11/59. Denies sx. This am talking with NT BP 146/69 pulse 72. Patient has not taken norvasc this am yet.  Frequency: yesterday  Pertinent Negatives: Patient denies chest pain no difficulty breathing reported. No headache no dizziness, no blurred vision Disposition: [] ED /[] Urgent Care (no appt availability in office) / [] Appointment(In office/virtual)/ []  [place] [facility]/ [] Home Care/ [] Refused Recommended Disposition /[] [place] [facility]/ [x]  Follow-up with PCP Additional Notes:   Recommended to take prescribed dose and recheck BP this afternoon unless sx noted. Allow PCP or another provider to respond before increasing dose of medication again. Please advise. Patient in process of moving and stress reported. Please advise.  Reviewed PCP is off today and another provider will review. Patient needs education on medication.     Reason for Disposition  [id number] Systolic BP  >= [id number] OR Diastolic >= 80 AND A999333 taking BP medications  Answer Assessment - Initial Assessment Questions 1. BLOOD PRESSURE: "What is the blood pressure?" "Did you take at  least two measurements 5 minutes apart?"     BP 146/69 pulse 72 2. ONSET: "When did you take your blood pressure?"     Just now  3. HOW: "How did you take your blood pressure?" (e.g., automatic home BP monitor, visiting nurse)     Home monitor  4. HISTORY: "Do you have a history of high blood pressure?"     Yes  5. MEDICINES: "Are you taking any medicines for blood pressure?" "Have you missed any doses recently?"     Norvasc 2.5 mg 6. OTHER SYMPTOMS: "Do you have any symptoms?" (e.g., blurred vision, chest pain, difficulty breathing, headache, weakness)     Denies sx   7. PREGNANCY: "Is there any chance you are pregnant?" "When was your last menstrual period?"     na  Protocols used: Blood Pressure - High-A-AH

## 2022-05-19 ENCOUNTER — Ambulatory Visit: Payer: Self-pay

## 2022-05-19 NOTE — Telephone Encounter (Signed)
  Chief Complaint: Low BP reading - holding medication Symptoms: BP 110/55 Frequency: today Pertinent Negatives: Patient denies any s/s  Disposition: [] ED /[] Urgent Care (no appt availability in office) / [] Appointment(In office/virtual)/ []  [place] [facility]/ [] Home Care/ [x] Refused Recommended Disposition /[] [place] [facility]/ []  Follow-up with PCP Additional Notes: PT called to report that she will not be taking the amlodipine today d/t current Bp of 110/55. Pt states she will carefully monitor BP and seek care if s/s of high or low BP develop. Pt states that she is very sensitive to medications.     Reason for Disposition  [id number] Systolic BP [id number] AND A999333 taking blood pressure medications AND [3] NOT dizzy, lightheaded or weak  Answer Assessment - Initial Assessment Questions 1. BLOOD PRESSURE: "What is the blood pressure?" "Did you take at least two measurements 5 minutes apart?"     110/55 2. ONSET: "When did you take your blood pressure?"     110/55 - just now 3. HOW: "How did you obtain the blood pressure?" (e.g., visiting nurse, automatic home BP monitor)     Automatic 4. HISTORY: "Do you have a history of low blood pressure?" "What is your blood pressure normally?"     no 5. MEDICINES: "Are you taking any medications for blood pressure?" If Yes, ask: "Have they been changed recently?"     Amlodipine 2.5  Protocols used: Blood Pressure - Low-A-AH

## 2022-06-12 DIAGNOSIS — R2689 Other abnormalities of gait and mobility: Secondary | ICD-10-CM | POA: Diagnosis not present

## 2022-06-12 DIAGNOSIS — R2681 Unsteadiness on feet: Secondary | ICD-10-CM | POA: Diagnosis not present

## 2022-06-12 DIAGNOSIS — M158 Other polyosteoarthritis: Secondary | ICD-10-CM | POA: Diagnosis not present

## 2022-06-13 ENCOUNTER — Telehealth: Payer: Self-pay | Admitting: Family Medicine

## 2022-06-13 NOTE — Telephone Encounter (Signed)
Pt is calling in because she was put on bp medication by Dr. Sherrie Mustache. Pt says that her blood pressure continues to fluctuate. Pt says sometimes her top number is extremely high and sometimes the bottom number is extremely low. Pt wanted to speak with Dr. Sherrie Mustache or his nurse about this. Please follow up with pt.

## 2022-06-14 ENCOUNTER — Other Ambulatory Visit: Payer: Self-pay | Admitting: Family Medicine

## 2022-06-19 ENCOUNTER — Ambulatory Visit: Payer: Medicare HMO | Admitting: Family Medicine

## 2022-06-19 DIAGNOSIS — M158 Other polyosteoarthritis: Secondary | ICD-10-CM | POA: Diagnosis not present

## 2022-06-19 DIAGNOSIS — R2681 Unsteadiness on feet: Secondary | ICD-10-CM | POA: Diagnosis not present

## 2022-06-19 DIAGNOSIS — R2689 Other abnormalities of gait and mobility: Secondary | ICD-10-CM | POA: Diagnosis not present

## 2022-06-20 ENCOUNTER — Encounter: Payer: Self-pay | Admitting: Family Medicine

## 2022-06-20 ENCOUNTER — Ambulatory Visit (INDEPENDENT_AMBULATORY_CARE_PROVIDER_SITE_OTHER): Payer: Medicare HMO | Admitting: Family Medicine

## 2022-06-20 VITALS — BP 141/79 | HR 70 | Temp 97.8°F | Resp 16 | Wt 133.3 lb

## 2022-06-20 DIAGNOSIS — I10 Essential (primary) hypertension: Secondary | ICD-10-CM | POA: Insufficient documentation

## 2022-06-20 DIAGNOSIS — R0989 Other specified symptoms and signs involving the circulatory and respiratory systems: Secondary | ICD-10-CM

## 2022-06-20 MED ORDER — METOPROLOL SUCCINATE ER 25 MG PO TB24: 12.5000 mg | ORAL_TABLET | Freq: Every day | ORAL | 0 refills | Status: DC

## 2022-06-20 NOTE — Patient Instructions (Addendum)
.   Please review the attached list of medications and notify my office if there are any errors.   . Please bring all of your medications to every appointment so we can make sure that our medication list is the same as yours.   

## 2022-06-20 NOTE — Progress Notes (Signed)
I,Laura Love,acting as a scribe for Mila Merry, MD.,have documented all relevant documentation on the behalf of Mila Merry, MD,as directed by  Mila Merry, MD while in the presence of Mila Merry, MD.     Established patient visit   Patient: Laura Love   DOB: 04-13-1940   82 y.o. Female  MRN: [id number] Visit Date: 06/20/2022  Today's healthcare provider: Mila Merry, MD   Chief Complaint  Patient presents with   Hypertension   Subjective    HPI  Hypertension, follow-up  BP Readings from Last 3 Encounters:  05/15/22 (!) 181/92  05/12/22 (!) 169/64  01/04/22 (!) 144/64   Wt Readings from Last 3 Encounters:  05/15/22 132 lb 3.2 oz (60 kg)  05/12/22 141 lb (64 kg)  01/04/22 132 lb (59.9 kg)     She was last seen for hypertension 1 months ago.  BP at that visit was 181/92. Management since that visit includes start amlodipine 2.5 mg daily.  She reports fair compliance with treatment. Patient reports taking medication PRN. She reports checking BP several times a day. Checks BP in right and left arm. She is checking with 3 different devices. She did take amlodipine this morning.  She is not having side effects.   Outside blood pressures are stable most days. 06/14/22 AM 144/79, 138/73, PM 123/52, 132/64, 143/69, 152/74,143/72, 142/73, 117/65, 127/62, 125/64, 125/63.  06/15/22 AM 128/65, 117/58 06/16/22 AM 121/59, 131/66  06/17/22 135/63, 141/67 06/18/22 110/66, 136/69, 132/71, 141/68, 114/58, 130/65 06/19/22 135/67,165/79,153/75,121/63 06/20/22 155/73,144/71  She also reports she is currently living in [facility] which has been somewhat stressful. She is planning on moving to [facility] in a few weeks. She is also planning on having hip replacement surgery the third week of May.    Pertinent labs Lab Results  Component Value Date   CHOL 215 (H) 05/17/2022   HDL 75 05/17/2022   LDLCALC 126 (H) 05/17/2022   LDLDIRECT 159.3 10/24/2010   TRIG 80 05/17/2022    CHOLHDL 2.9 05/17/2022   Lab Results  Component Value Date   NA 139 05/17/2022   K 4.1 05/17/2022   CREATININE 0.81 05/17/2022   EGFR 73 05/17/2022   GLUCOSE 93 05/17/2022   TSH 2.830 05/17/2022     The ASCVD Risk score (Arnett DK, et al., 2019) failed to calculate for the following reasons:   The 2019 ASCVD risk score is only valid for ages 46 to 27  ---------------------------------------------------------------------------------------------------   Medications: Outpatient Medications Prior to Visit  Medication Sig   acetaminophen (TYLENOL) 325 MG tablet Take 650 mg by mouth every 6 (six) hours as needed. 1 a day   albuterol (VENTOLIN HFA) 108 (90 Base) MCG/ACT inhaler Inhale 2 puffs into the lungs every 6 (six) hours as needed for wheezing or shortness of breath.   amLODipine (NORVASC) 2.5 MG tablet TAKE 1 TABLET BY MOUTH DAILY   Biotin 1000 MCG tablet Take 1,000 mcg by mouth daily.   buPROPion (WELLBUTRIN SR) 150 MG 12 hr tablet TAKE ONE TABLET TWICE DAILY   CALCIUM PO Take 600 mg by mouth 2 (two) times daily. + D3 800 units   dicyclomine (BENTYL) 10 MG capsule Take 1 capsule (10 mg total) by mouth 4 (four) times daily -  before meals and at bedtime. As needed for abdominal pain   ezetimibe (ZETIA) 10 MG tablet TAKE ONE TABLET EVERY DAY   hyoscyamine (ANASPAZ) 0.125 MG TBDP disintergrating tablet Place 1 tablet (0.125 mg total) under the  tongue every 4 (four) hours as needed.   ibuprofen (ADVIL,MOTRIN) 200 MG tablet Take 200 mg by mouth every 6 (six) hours as needed. 1 tablet daily   Melatonin 3 MG TABS Take 1 tablet by mouth at bedtime.   sennosides-docusate sodium (SENOKOT-S) 8.6-50 MG tablet Take 2-3 tablets by mouth 2 (two) times daily. 3 tablets every morning, then 3-4 tablets in the evenings   SYNTHROID 100 MCG tablet TAKE 1 TABLET BY MOUTH DAILY   vitamin C (ASCORBIC ACID) 500 MG tablet Take 500 mg by mouth daily.   Vitamin D, Cholecalciferol, 1000 UNITS TABS Take 2  tablets by mouth daily.   Zinc 50 MG CAPS Take by mouth.   zinc gluconate 50 MG tablet Take 50 mg by mouth daily as needed.   zoledronic acid (RECLAST) 5 MG/100ML SOLN injection Inject 5 mg into the vein. Once yearly starting June 2019 per Dr. Tedd Sias   No facility-administered medications prior to visit.    Review of Systems  Constitutional:  Negative for appetite change.  Eyes:  Negative for visual disturbance.  Respiratory:  Negative for chest tightness and shortness of breath.   Cardiovascular:  Negative for chest pain and leg swelling.  Gastrointestinal:  Negative for abdominal pain, nausea and vomiting.  Neurological:  Negative for dizziness, light-headedness and headaches.       Objective    BP (!) 141/79 (BP Location: Left Arm, Patient Position: Sitting, Cuff Size: Normal)   Pulse 70   Temp 97.8 F (36.6 C) (Temporal)   Resp 16   Wt 133 lb 4.8 oz (60.5 kg)   SpO2 98%   BMI 24.38 kg/m  BP Readings from Last 3 Encounters:  05/15/22 (!) 181/92  05/12/22 (!) 169/64  01/04/22 (!) 144/64   Wt Readings from Last 3 Encounters:  05/15/22 132 lb 3.2 oz (60 kg)  05/12/22 141 lb (64 kg)  01/04/22 132 lb (59.9 kg)   Physical Exam  General appearance: Well developed, well nourished female, cooperative and in no acute distress Head: Normocephalic, without obvious abnormality, atraumatic Respiratory: Respirations even and unlabored, normal respiratory rate Extremities: All extremities are intact.  Skin: Skin color, texture, turgor normal. No rashes seen  Psych: Appropriate mood and affect. Neurologic: Mental status: Alert, oriented to person, place, and time, thought content appropriate.    Assessment & Plan     1. Labile blood pressure Likely some component of stress. Amlodipine effective when BP is high, but at other times results in low blood pressure. Consider upcoming surgery a betablocker may be beneficial   Will start metoprolol succinate (TOPROL-XL) 25 MG 24 hr  tablet; Take 0.5 tablets (12.5 mg total) by mouth daily.  Dispense: 30 tablet; Refill: 0  May continue amlodipine prn if she sees BP >160. Follow up in 2 weeks.       The entirety of the information documented in the History of Present Illness, Review of Systems and Physical Exam were personally obtained by me. Portions of this information were initially documented by the CMA and reviewed by me for thoroughness and accuracy.     Mila Merry, MD  [facility] [phone] (phone) [phone] (fax)  [facility]

## 2022-06-21 DIAGNOSIS — R2689 Other abnormalities of gait and mobility: Secondary | ICD-10-CM | POA: Diagnosis not present

## 2022-06-21 DIAGNOSIS — R2681 Unsteadiness on feet: Secondary | ICD-10-CM | POA: Diagnosis not present

## 2022-06-21 DIAGNOSIS — M158 Other polyosteoarthritis: Secondary | ICD-10-CM | POA: Diagnosis not present

## 2022-06-28 ENCOUNTER — Encounter: Payer: Self-pay | Admitting: Family Medicine

## 2022-06-28 ENCOUNTER — Ambulatory Visit (INDEPENDENT_AMBULATORY_CARE_PROVIDER_SITE_OTHER): Payer: Medicare HMO | Admitting: Family Medicine

## 2022-06-28 VITALS — BP 109/70 | HR 72 | Wt 133.0 lb

## 2022-06-28 DIAGNOSIS — I10 Essential (primary) hypertension: Secondary | ICD-10-CM

## 2022-06-28 DIAGNOSIS — Z111 Encounter for screening for respiratory tuberculosis: Secondary | ICD-10-CM

## 2022-06-28 NOTE — Progress Notes (Signed)
Vivien Rota DeSanto,acting as a scribe for Mila Merry, MD.,have documented all relevant documentation on the behalf of Mila Merry, MD,as directed by  Mila Merry, MD while in the presence of Mila Merry, MD.     Established patient visit   Patient: Laura Love   DOB: 12-30-1940   82 y.o. Female  MRN: [id number] Visit Date: 06/28/2022  Today's healthcare provider: Mila Merry, MD   Chief Complaint  Patient presents with   Follow-up    Hypertension f/u-metropolol last taken 06/20/22.-pt stated feeling weird and cannot think.   Subjective    HPI  Hypertension, follow-up  BP Readings from Last 3 Encounters:  06/20/22 (!) 141/79  05/15/22 (!) 181/92  05/12/22 (!) 169/64   Wt Readings from Last 3 Encounters:  06/20/22 133 lb 4.8 oz (60.5 kg)  05/15/22 132 lb 3.2 oz (60 kg)  05/12/22 141 lb (64 kg)     She was last seen for hypertension 1 weeks ago.  BP at that visit was as above. Management since that visit includes starting Metoprolol.  She was instructed that she may continue Amlodipine for 2 weeks if BP >160.Marland Kitchen  She states she only take metoprolol once it caused confusion and trouble thinking that lasted for nearly 3 days. She has not taken another since then.   She brings in log of BP readings which are consistently below 140 in the morning, but often rising in the 140s and 150s in the afternoons. She takes a 2.5mg  amlodipine dose when that happens which usually brings it back down.   She is process of moving to [facility] where she thinks she will be much more comfortable, and anticipate THR later this month.   -----------------------------------------------------   Medications: Outpatient Medications Prior to Visit  Medication Sig   acetaminophen (TYLENOL) 325 MG tablet Take 650 mg by mouth every 6 (six) hours as needed. 1 a day   albuterol (VENTOLIN HFA) 108 (90 Base) MCG/ACT inhaler Inhale 2 puffs into the lungs every 6 (six) hours as needed for wheezing  or shortness of breath.   amLODipine (NORVASC) 2.5 MG tablet TAKE 1 TABLET BY MOUTH DAILY   Biotin 1000 MCG tablet Take 1,000 mcg by mouth daily.   buPROPion (WELLBUTRIN SR) 150 MG 12 hr tablet TAKE ONE TABLET TWICE DAILY   CALCIUM PO Take 600 mg by mouth 2 (two) times daily. + D3 800 units   dicyclomine (BENTYL) 10 MG capsule Take 1 capsule (10 mg total) by mouth 4 (four) times daily -  before meals and at bedtime. As needed for abdominal pain   ezetimibe (ZETIA) 10 MG tablet TAKE ONE TABLET EVERY DAY   hyoscyamine (ANASPAZ) 0.125 MG TBDP disintergrating tablet Place 1 tablet (0.125 mg total) under the tongue every 4 (four) hours as needed.   ibuprofen (ADVIL,MOTRIN) 200 MG tablet Take 200 mg by mouth every 6 (six) hours as needed. 1 tablet daily   Melatonin 3 MG TABS Take 1 tablet by mouth at bedtime.   metoprolol succinate (TOPROL-XL) 25 MG 24 hr tablet Take 0.5 tablets (12.5 mg total) by mouth daily.   sennosides-docusate sodium (SENOKOT-S) 8.6-50 MG tablet Take 2-3 tablets by mouth 2 (two) times daily. 3 tablets every morning, then 3-4 tablets in the evenings   SYNTHROID 100 MCG tablet TAKE 1 TABLET BY MOUTH DAILY   vitamin C (ASCORBIC ACID) 500 MG tablet Take 500 mg by mouth daily.   Vitamin D, Cholecalciferol, 1000 UNITS TABS Take 2 tablets by mouth  daily.   Zinc 50 MG CAPS Take by mouth.   zinc gluconate 50 MG tablet Take 50 mg by mouth daily as needed.   zoledronic acid (RECLAST) 5 MG/100ML SOLN injection Inject 5 mg into the vein. Once yearly starting June 2019 per Dr. Tedd Sias   No facility-administered medications prior to visit.       Objective    BP 109/70 (BP Location: Left Arm, Patient Position: Sitting, Cuff Size: Normal)   Pulse 72   Wt 133 lb (60.3 kg)   SpO2 98%   BMI 24.33 kg/m      Assessment & Plan     1. Primary hypertension Did not tolerate metoprolol due to neurological side effects, but is doing well on current meds just taking amlodipine prn BP >140,  which typically happens in the afternoon. Advised she may want to take it on a schedule every afternoon for several days prior to surgery unless it runs consistently low  2. Screening-pulmonary TB She anticipates moving into [place] in the coming weeks.  - QuantiFERON-TB Gold Plus      The entirety of the information documented in the History of Present Illness, Review of Systems and Physical Exam were personally obtained by me. Portions of this information were initially documented by the CMA and reviewed by me for thoroughness and accuracy.     Mila Merry, MD  [facility] [phone] (phone) [phone] (fax)  [facility]

## 2022-06-30 ENCOUNTER — Telehealth: Payer: Self-pay | Admitting: Family Medicine

## 2022-06-30 ENCOUNTER — Encounter: Payer: Self-pay | Admitting: Pulmonary Disease

## 2022-06-30 NOTE — Telephone Encounter (Signed)
Informed patient- "waiting for her TB test to come back. I told her that it would not be back until Monday" Per Doctor Sherrie Mustache.

## 2022-07-02 NOTE — Discharge Instructions (Addendum)
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 Angie Fava., M.D.     Dept. of Orthopaedics & Sports Medicine  [facility]  [address]  [place], [place]  [address]  Phone: [phone]   Fax: [phone]    DIET: Drink plenty of non-alcoholic fluids. Resume your normal diet. Include foods high in fiber.  ACTIVITY:  You may use crutches or a walker with weight-bearing as tolerated, unless instructed otherwise. You may be weaned off of the walker or crutches by your Physical Therapist.  Do NOT reach below the level of your knees or cross your legs until allowed.    Continue doing gentle exercises. Exercising will reduce the pain and swelling, increase motion, and prevent muscle weakness.   Please continue to use the TED compression stockings for 6 weeks. You may remove the stockings at night, but should reapply them in the morning. Do not drive or operate any equipment until instructed.  WOUND CARE:  Continue to use ice packs periodically to reduce pain and swelling. Keep the incision clean and dry. You may bathe or shower after the staples are removed at the first office visit following surgery. The Aquacel bandage stays on for 7 days postoperatively.  This should be changed by physical therapy to a honeycomb dressing.  Staples will be removed at 2-week period by physical therapy.  MEDICATIONS: You may resume your regular medications. Please take the pain medication as prescribed on the medication. Do not take pain medication on an empty stomach. You have been given a prescription for a blood thinner to prevent blood clots. Please take the medication as instructed. (NOTE: After completing a 2 week course of Lovenox, take one Enteric-coated aspirin twice a day.) Pain medications and iron supplements can cause constipation. Use a stool softener (Senokot or Colace) on a daily basis and a laxative (dulcolax or miralax) as needed. Do not drive or drink alcoholic  beverages when taking pain medications.  CALL THE OFFICE FOR: Temperature above 101 degrees Excessive bleeding or drainage on the dressing. Excessive swelling, coldness, or paleness of the toes. Persistent nausea and vomiting.  FOLLOW-UP:  You should have an appointment to return to the office in 6 weeks after surgery. Arrangements have been made for continuation of Physical Therapy (either home therapy or outpatient therapy).     [facility] Department Directory         [email or web address]       [email or web address]          Cardiology  Appointments: [place] - [phone]  Endocrinology  Appointments: [place] [phone] [place] - [phone]  Gastroenterology  Appointments: [place] [phone] [place] - [phone]        General Surgery   Appointments: [facility]  Internal Medicine/Family Medicine  Appointments: [facility] [place] - [phone] [place] - [phone]  Metabolic and Weigh Loss Surgery  Appointments: [facility]        Neurology  Appointments: [place] [phone] [place] - [phone]  Neurosurgery  Appointments: [place]  Obstetrics & Gynecology  Appointments: [place] [phone] [place] - [phone]        Pediatrics  Appointments: Sherrie Sport [phone] [place] - [phone]  Physiatry  Appointments: [place] [phone]  Physical Therapy  Appointments: [place] - [phone]        Podiatry  Appointments: [place] [phone] [place] - [phone]  Pulmonology  Appointments: [place]  Rheumatology  Appointments: [place] [phone]   Location: [facility]  [address] [place], [place]  [address]  Sherrie Sport Location: [facility]. [address] [place], [place]  [address]  [place]  Location: [facility] [address] [place], [place]  [address]

## 2022-07-03 ENCOUNTER — Telehealth: Payer: Self-pay | Admitting: Family Medicine

## 2022-07-03 LAB — QUANTIFERON-TB GOLD PLUS
QuantiFERON Mitogen Value: >10 IU/mL
QuantiFERON Nil Value: 0.03 IU/mL
QuantiFERON TB1 Ag Value: 0.03 IU/mL
QuantiFERON TB2 Ag Value: 0.03 IU/mL
QuantiFERON-TB Gold Plus: NEGATIVE

## 2022-07-03 NOTE — Telephone Encounter (Signed)
Patient advised. Verbalized understanding

## 2022-07-03 NOTE — Telephone Encounter (Signed)
CB- [phone] Pt is calling to check in on the status of paperwork for [place]. And TB test results. Please advise CB- [phone]

## 2022-07-10 ENCOUNTER — Other Ambulatory Visit: Payer: Self-pay

## 2022-07-10 ENCOUNTER — Encounter
Admission: RE | Admit: 2022-07-10 | Discharge: 2022-07-10 | Disposition: A | Discharge status: Home / Self Care | Payer: Medicare HMO | Source: Ambulatory Visit | Attending: Orthopedic Surgery | Admitting: Orthopedic Surgery

## 2022-07-10 DIAGNOSIS — Z01810 Encounter for preprocedural cardiovascular examination: Secondary | ICD-10-CM | POA: Diagnosis not present

## 2022-07-10 DIAGNOSIS — Z01818 Encounter for other preprocedural examination: Secondary | ICD-10-CM | POA: Diagnosis not present

## 2022-07-10 HISTORY — DX: Essential (primary) hypertension: I10

## 2022-07-10 HISTORY — DX: Depression, unspecified: F32.A

## 2022-07-10 HISTORY — DX: Hypothyroidism, unspecified: E03.9

## 2022-07-10 HISTORY — DX: Anxiety disorder, unspecified: F41.9

## 2022-07-10 HISTORY — DX: Other specified postprocedural states: Z98.890

## 2022-07-10 HISTORY — DX: Nausea with vomiting, unspecified: R11.2

## 2022-07-10 LAB — TYPE AND SCREEN
ABO/RH(D): O POS
Antibody Screen: NEGATIVE

## 2022-07-10 LAB — CBC WITH DIFFERENTIAL/PLATELET
Abs Immature Granulocytes: 0.02 10*3/uL (ref 0.00–0.07)
Basophils Absolute: 0.1 10*3/uL (ref 0.0–0.1)
Basophils Relative: 1 %
Eosinophils Absolute: 0.1 10*3/uL (ref 0.0–0.5)
Eosinophils Relative: 1 %
HCT: 41.1 % (ref 36.0–46.0)
Hemoglobin: 13.5 g/dL (ref 12.0–15.0)
Immature Granulocytes: 0 %
Lymphocytes Relative: 14 %
Lymphs Abs: 0.9 10*3/uL (ref 0.7–4.0)
MCH: 32.5 pg (ref 26.0–34.0)
MCHC: 32.8 g/dL (ref 30.0–36.0)
MCV: 98.8 fL (ref 80.0–100.0)
Monocytes Absolute: 0.4 10*3/uL (ref 0.1–1.0)
Monocytes Relative: 5 %
Neutro Abs: 5.2 10*3/uL (ref 1.7–7.7)
Neutrophils Relative %: 79 %
Platelets: 295 10*3/uL (ref 150–400)
RBC: 4.16 MIL/uL (ref 3.87–5.11)
RDW: 11.9 % (ref 11.5–15.5)
WBC: 6.5 10*3/uL (ref 4.0–10.5)
nRBC: 0 % (ref 0.0–0.2)

## 2022-07-10 LAB — COMPREHENSIVE METABOLIC PANEL
ALT: 19 U/L (ref 0–44)
AST: 19 U/L (ref 15–41)
Albumin: 4.4 g/dL (ref 3.5–5.0)
Alkaline Phosphatase: 73 U/L (ref 38–126)
Anion gap: 10 (ref 5–15)
BUN: 14 mg/dL (ref 8–23)
CO2: 27 mmol/L (ref 22–32)
Calcium: 9.1 mg/dL (ref 8.9–10.3)
Chloride: 99 mmol/L (ref 98–111)
Creatinine, Ser: 0.75 mg/dL (ref 0.44–1.00)
GFR, Estimated: >60 mL/min (ref >60)
Glucose, Bld: 95 mg/dL (ref 70–99)
Potassium: 3.6 mmol/L (ref 3.5–5.1)
Sodium: 136 mmol/L (ref 135–145)
Total Bilirubin: 1.3 mg/dL — ABNORMAL HIGH (ref 0.3–1.2)
Total Protein: 7.2 g/dL (ref 6.5–8.1)

## 2022-07-10 LAB — URINALYSIS, ROUTINE W REFLEX MICROSCOPIC
Bilirubin Urine: NEGATIVE
Glucose, UA: NEGATIVE mg/dL
Leukocytes,Ua: NEGATIVE
Nitrite: NEGATIVE
Protein, ur: NEGATIVE mg/dL
Specific Gravity, Urine: 1.015 (ref 1.005–1.030)
pH: 6 (ref 5.0–8.0)

## 2022-07-10 LAB — SEDIMENTATION RATE: Sed Rate: 9 mm/hr (ref 0–30)

## 2022-07-10 LAB — C-REACTIVE PROTEIN: CRP: <0.5 mg/dL (ref <1.0)

## 2022-07-10 LAB — SURGICAL PCR SCREEN
MRSA, PCR: NEGATIVE
Staphylococcus aureus: NEGATIVE

## 2022-07-10 NOTE — Patient Instructions (Addendum)
Your procedure is scheduled on: Monday 07/17/22 To find out your arrival time, please call [phone] between 1PM - 3PM on:   Friday 07/14/22 Report to the Registration Desk on the 1st floor of the [facility]. Free Valet parking is available.  If your arrival time is 6:00 am, do not arrive before that time as the [facility] entrance doors do not open until 6:00 am.  REMEMBER: Instructions that are not followed completely may result in serious medical risk, up to and including death; or upon the discretion of your surgeon and anesthesiologist your surgery may need to be rescheduled.  Do not eat food after midnight the night before surgery.  No gum chewing or hard candies.  You may however, drink CLEAR liquids up to 2 hours before you are scheduled to arrive for your surgery. Do not drink anything within 2 hours of your scheduled arrival time.  Clear liquids include: - water  - apple juice without pulp - gatorade (not RED colors) - black coffee or tea (Do NOT add milk or creamers to the coffee or tea) Do NOT drink anything that is not on this list.  Type 1 and Type 2 diabetics should only drink water.  In addition, your doctor has ordered for you to drink the provided:  Ensure Pre-Surgery Clear Carbohydrate Drink  Drinking this carbohydrate drink up to two hours before surgery helps to reduce insulin resistance and improve patient outcomes. Please complete drinking 2 hours before scheduled arrival time.  One week prior to surgery: Stop Anti-inflammatories (NSAIDS) such as Advil, Aleve, Ibuprofen, Motrin, Naproxen, Naprosyn and Aspirin based products such as Excedrin, Goody's Powder, BC Powder. You may however, continue to take Tylenol if needed for pain up until the day of surgery.  Stop ANY OVER THE COUNTER supplements and vitamins until after surgery.  Continue taking all prescribed medications.  TAKE ONLY THESE MEDICATIONS THE MORNING OF SURGERY WITH A SIP OF  WATER:  amLODipine (NORVASC) 2.5 MG tablet For Systolic BP > 160  buPROPion (WELLBUTRIN SR) 150 MG 12 hr tablet  ezetimibe (ZETIA) 10 MG tablet  SYNTHROID 100 MCG tablet   Use inhalers on the day of surgery and bring to the hospital.  No Alcohol for 24 hours before or after surgery.  No Smoking including e-cigarettes for 24 hours before surgery.  No chewable tobacco products for at least 6 hours before surgery.  No nicotine patches on the day of surgery.  Do not use any "recreational" drugs for at least a week (preferably 2 weeks) before your surgery.  Please be advised that the combination of cocaine and anesthesia may have negative outcomes, up to and including death. If you test positive for cocaine, your surgery will be cancelled.  On the morning of surgery brush your teeth with toothpaste and water, you may rinse your mouth with mouthwash if you wish. Do not swallow any toothpaste or mouthwash.  Use CHG Soap or wipes as directed on instruction sheet. Shower daily starting on Thursday 07/13/22.  Do not wear lotions, powders, or perfumes.   Do not shave body hair from the neck down 48 hours before surgery.  Wear comfortable clothing (specific to your surgery type) to the hospital.  Do not wear jewelry, make-up, hairpins, clips or nail polish.  Contact lenses, hearing aids and dentures may not be worn into surgery.  Do not bring valuables to the hospital. [facility] is not responsible for any missing/lost belongings or valuables.   Notify your doctor  if there is any change in your medical condition (cold, fever, infection).  If you are being discharged the day of surgery, you will not be allowed to drive home. You will need a responsible individual to drive you home and stay with you for 24 hours after surgery.   If you are taking public transportation, you will need to have a responsible individual with you.  If you are being admitted to the hospital overnight, leave  your suitcase in the car. After surgery it may be brought to your room.  In case of increased patient census, it may be necessary for you, the patient, to continue your postoperative care in the Same Day Surgery department.  After surgery, you can help prevent lung complications by doing breathing exercises.  Take deep breaths and cough every 1-2 hours. Your doctor may order a device called an Incentive Spirometer to help you take deep breaths. When coughing or sneezing, hold a pillow firmly against your incision with both hands. This is called "splinting." Doing this helps protect your incision. It also decreases belly discomfort.  Surgery Visitation Policy:  Patients undergoing a surgery or procedure may have two family members or support persons with them as long as the person is not COVID-19 positive or experiencing its symptoms.   Inpatient Visitation:    Visiting hours are 7 a.m. to 8 p.m. Up to four visitors are allowed at one time in a patient room. The visitors may rotate out with other people during the day. One designated support person (adult) may remain overnight.  Please call the Pre-admissions Testing Dept. at [phone] if you have any questions about these instructions.    Pre-operative 5 CHG Bath Instructions   You can play a key role in reducing the risk of infection after surgery. Your skin needs to be as free of germs as possible. You can reduce the number of germs on your skin by washing with CHG (chlorhexidine gluconate) soap before surgery. CHG is an antiseptic soap that kills germs and continues to kill germs even after washing.   DO NOT use if you have an allergy to chlorhexidine/CHG or antibacterial soaps. If your skin becomes reddened or irritated, stop using the CHG and notify one of our RNs at [phone].   Please shower with the CHG soap starting 4 days before surgery using the following schedule:     Please keep in mind the following:  DO NOT  shave, including legs and underarms, starting the day of your first shower.   You may shave your face at any point before/day of surgery.  Place clean sheets on your bed the day you start using CHG soap. Use a clean washcloth (not used since being washed) for each shower. DO NOT sleep with pets once you start using the CHG.   CHG Shower Instructions:  If you choose to wash your hair and private area, wash first with your normal shampoo/soap.  After you use shampoo/soap, rinse your hair and body thoroughly to remove shampoo/soap residue.  Turn the water OFF and apply about 3 tablespoons (45 ml) of CHG soap to a CLEAN washcloth.  Apply CHG soap ONLY FROM YOUR NECK DOWN TO YOUR TOES (washing for 3-5 minutes)  DO NOT use CHG soap on face, private areas, open wounds, or sores.  Pay special attention to the area where your surgery is being performed.  If you are having back surgery, having someone wash your back for you may be helpful. Wait 2  minutes after CHG soap is applied, then you may rinse off the CHG soap.  Pat dry with a clean towel  Put on clean clothes/pajamas   If you choose to wear lotion, please use ONLY the CHG-compatible lotions on the back of this paper.     Additional instructions for the day of surgery: DO NOT APPLY any lotions, deodorants, cologne, or perfumes.   Put on clean/comfortable clothes.  Brush your teeth.  Ask your nurse before applying any prescription medications to the skin.      CHG Compatible Lotions   Aveeno Moisturizing lotion  Cetaphil Moisturizing Cream  Cetaphil Moisturizing Lotion  Clairol Herbal Essence Moisturizing Lotion, Dry Skin  Clairol Herbal Essence Moisturizing Lotion, Extra Dry Skin  Clairol Herbal Essence Moisturizing Lotion, Normal Skin  Curel Age Defying Therapeutic Moisturizing Lotion with Alpha Hydroxy  Curel Extreme Care Body Lotion  Curel Soothing Hands Moisturizing Hand Lotion  Curel Therapeutic Moisturizing Cream,  Fragrance-Free  Curel Therapeutic Moisturizing Lotion, Fragrance-Free  Curel Therapeutic Moisturizing Lotion, Original Formula  Eucerin Daily Replenishing Lotion  Eucerin Dry Skin Therapy Plus Alpha Hydroxy Crme  Eucerin Dry Skin Therapy Plus Alpha Hydroxy Lotion  Eucerin Original Crme  Eucerin Original Lotion  Eucerin Plus Crme Eucerin Plus Lotion  Eucerin TriLipid Replenishing Lotion  Keri Anti-Bacterial Hand Lotion  Keri Deep Conditioning Original Lotion Dry Skin Formula Softly Scented  Keri Deep Conditioning Original Lotion, Fragrance Free Sensitive Skin Formula  Keri Lotion Fast Absorbing Fragrance Free Sensitive Skin Formula  Keri Lotion Fast Absorbing Softly Scented Dry Skin Formula  Keri Original Lotion  Keri Skin Renewal Lotion Keri Silky Smooth Lotion  Keri Silky Smooth Sensitive Skin Lotion  Nivea Body Creamy Conditioning Oil  Nivea Body Extra Enriched Lotion  Nivea Body Original Lotion  Nivea Body Sheer Moisturizing Lotion Nivea Crme  Nivea Skin Firming Lotion  NutraDerm 30 Skin Lotion  NutraDerm Skin Lotion  NutraDerm Therapeutic Skin Cream  NutraDerm Therapeutic Skin Lotion  ProShield Protective Hand Cream  Provon moisturizing lotion  How to Use an Incentive Spirometer  An incentive spirometer is a tool that measures how well you are filling your lungs with each breath. Learning to take long, deep breaths using this tool can help you keep your lungs clear and active. This may help to reverse or lessen your chance of developing breathing (pulmonary) problems, especially infection. You may be asked to use a spirometer: After a surgery. If you have a lung problem or a history of smoking. After a long period of time when you have been unable to move or be active. If the spirometer includes an indicator to show the highest number that you have reached, your health care provider or respiratory therapist will help you set a goal. Keep a log of your progress as told by  your health care provider. What are the risks? Breathing too quickly may cause dizziness or cause you to pass out. Take your time so you do not get dizzy or light-headed. If you are in pain, you may need to take pain medicine before doing incentive spirometry. It is harder to take a deep breath if you are having pain. How to use your incentive spirometer  Sit up on the edge of your bed or on a chair. Hold the incentive spirometer so that it is in an upright position. Before you use the spirometer, breathe out normally. Place the mouthpiece in your mouth. Make sure your lips are closed tightly around it. Breathe in slowly and as  deeply as you can through your mouth, causing the piston or the ball to rise toward the top of the chamber. Hold your breath for 3-5 seconds, or for as long as possible. If the spirometer includes a coach indicator, use this to guide you in breathing. Slow down your breathing if the indicator goes above the marked areas. Remove the mouthpiece from your mouth and breathe out normally. The piston or ball will return to the bottom of the chamber. Rest for a few seconds, then repeat the steps 10 or more times. Take your time and take a few normal breaths between deep breaths so that you do not get dizzy or light-headed. Do this every 1-2 hours when you are awake. If the spirometer includes a goal marker to show the highest number you have reached (best effort), use this as a goal to work toward during each repetition. After each set of 10 deep breaths, cough a few times. This will help to make sure that your lungs are clear. If you have an incision on your chest or abdomen from surgery, place a pillow or a rolled-up towel firmly against the incision when you cough. This can help to reduce pain while taking deep breaths and coughing. General tips When you are able to get out of bed: Walk around often. Continue to take deep breaths and cough in order to clear your  lungs. Keep using the incentive spirometer until your health care provider says it is okay to stop using it. If you have been in the hospital, you may be told to keep using the spirometer at home. Contact a health care provider if: You are having difficulty using the spirometer. You have trouble using the spirometer as often as instructed. Your pain medicine is not giving enough relief for you to use the spirometer as told. You have a fever. Get help right away if: You develop shortness of breath. You develop a cough with bloody mucus from the lungs. You have fluid or blood coming from an incision site after you cough. Summary An incentive spirometer is a tool that can help you learn to take long, deep breaths to keep your lungs clear and active. You may be asked to use a spirometer after a surgery, if you have a lung problem or a history of smoking, or if you have been inactive for a long period of time. Use your incentive spirometer as instructed every 1-2 hours while you are awake. If you have an incision on your chest or abdomen, place a pillow or a rolled-up towel firmly against your incision when you cough. This will help to reduce pain. Get help right away if you have shortness of breath, you cough up bloody mucus, or blood comes from your incision when you cough. This information is not intended to replace advice given to you by your health care provider. Make sure you discuss any questions you have with your health care provider. Document Revised: 05/05/2019 Document Reviewed: 05/05/2019 Elsevier Patient Education  2023 Elsevier Inc.    Preoperative Educational Videos for Total Hip, Knee and Shoulder Replacements  To better prepare for surgery, please view our videos that explain the physical activity and discharge planning required to have the best surgical recovery at [facility].  [email or web address]  Questions? Call [phone] or email  [email or web address]

## 2022-07-12 DIAGNOSIS — M1612 Unilateral primary osteoarthritis, left hip: Secondary | ICD-10-CM | POA: Diagnosis not present

## 2022-07-17 ENCOUNTER — Encounter: Payer: Self-pay | Admitting: Orthopedic Surgery

## 2022-07-17 ENCOUNTER — Observation Stay: Payer: Medicare HMO

## 2022-07-17 ENCOUNTER — Ambulatory Visit: Payer: Medicare HMO | Admitting: Urgent Care

## 2022-07-17 ENCOUNTER — Other Ambulatory Visit: Payer: Self-pay

## 2022-07-17 ENCOUNTER — Encounter
Admission: RE | Disposition: A | Discharge status: Home / Self Care | Payer: Self-pay | Source: Home / Self Care | Attending: Orthopedic Surgery

## 2022-07-17 ENCOUNTER — Ambulatory Visit: Payer: Medicare HMO | Admitting: Anesthesiology

## 2022-07-17 ENCOUNTER — Observation Stay
Admission: RE | Admit: 2022-07-17 | Discharge: 2022-07-20 | Disposition: A | Discharge status: Home / Self Care | Payer: Medicare HMO | Attending: Orthopedic Surgery | Admitting: Orthopedic Surgery

## 2022-07-17 DIAGNOSIS — Z01818 Encounter for other preprocedural examination: Secondary | ICD-10-CM

## 2022-07-17 DIAGNOSIS — E039 Hypothyroidism, unspecified: Secondary | ICD-10-CM | POA: Insufficient documentation

## 2022-07-17 DIAGNOSIS — Z96642 Presence of left artificial hip joint: Secondary | ICD-10-CM

## 2022-07-17 DIAGNOSIS — Z471 Aftercare following joint replacement surgery: Secondary | ICD-10-CM | POA: Diagnosis not present

## 2022-07-17 DIAGNOSIS — M1612 Unilateral primary osteoarthritis, left hip: Secondary | ICD-10-CM | POA: Diagnosis not present

## 2022-07-17 DIAGNOSIS — Z79899 Other long term (current) drug therapy: Secondary | ICD-10-CM | POA: Insufficient documentation

## 2022-07-17 HISTORY — PX: TOTAL HIP ARTHROPLASTY: SHX124

## 2022-07-17 LAB — ABO/RH: ABO/RH(D): O POS

## 2022-07-17 SURGERY — ARTHROPLASTY, HIP, TOTAL,POSTERIOR APPROACH
Anesthesia: General | Site: Hip | Laterality: Left

## 2022-07-17 MED ORDER — PROPOFOL 10 MG/ML IV BOLUS
INTRAVENOUS | Status: DC | PRN
  Administered 2022-07-17: 20 mg via INTRAVENOUS

## 2022-07-17 MED ORDER — POLYVINYL ALCOHOL 1.4 % OP SOLN: 1.0000 [drp] | Freq: Four times a day (QID) | OPHTHALMIC | Status: DC | PRN

## 2022-07-17 MED ORDER — ONDANSETRON HCL 4 MG PO TABS: 4.0000 mg | ORAL_TABLET | Freq: Four times a day (QID) | ORAL | Status: DC | PRN

## 2022-07-17 MED ORDER — TRANEXAMIC ACID-NACL 1000-0.7 MG/100ML-% IV SOLN
INTRAVENOUS | Status: DC | PRN
  Administered 2022-07-17: 1000 mg via INTRAVENOUS

## 2022-07-17 MED ORDER — LEVOTHYROXINE SODIUM 100 MCG PO TABS
100.0000 ug | ORAL_TABLET | Freq: Every day | ORAL | Status: DC
  Administered 2022-07-18 – 2022-07-20 (×3): 100 ug via ORAL
  Filled 2022-07-17 (×3): qty 1

## 2022-07-17 MED ORDER — ORAL CARE MOUTH RINSE: 15.0000 mL | OROMUCOSAL | Status: DC | PRN

## 2022-07-17 MED ORDER — ACETAMINOPHEN 10 MG/ML IV SOLN
INTRAVENOUS | Status: AC
  Filled 2022-07-17: qty 100

## 2022-07-17 MED ORDER — METOCLOPRAMIDE HCL 10 MG PO TABS
10.0000 mg | ORAL_TABLET | Freq: Three times a day (TID) | ORAL | Status: AC
  Administered 2022-07-17 – 2022-07-19 (×6): 10 mg via ORAL

## 2022-07-17 MED ORDER — MAGNESIUM HYDROXIDE 400 MG/5ML PO SUSP
ORAL | Status: AC
  Filled 2022-07-17: qty 30

## 2022-07-17 MED ORDER — SENNOSIDES-DOCUSATE SODIUM 8.6-50 MG PO TABS
1.0000 | ORAL_TABLET | Freq: Two times a day (BID) | ORAL | Status: DC
  Administered 2022-07-17 – 2022-07-20 (×6): 1 via ORAL

## 2022-07-17 MED ORDER — ONDANSETRON HCL 4 MG/2ML IJ SOLN
4.0000 mg | Freq: Once | INTRAMUSCULAR | Status: AC | PRN
  Administered 2022-07-17: 4 mg via INTRAVENOUS

## 2022-07-17 MED ORDER — PHENOL 1.4 % MT LIQD: 1.0000 | OROMUCOSAL | Status: DC | PRN

## 2022-07-17 MED ORDER — EZETIMIBE 10 MG PO TABS
ORAL_TABLET | ORAL | Status: AC
  Filled 2022-07-17: qty 1

## 2022-07-17 MED ORDER — EZETIMIBE 10 MG PO TABS
10.0000 mg | ORAL_TABLET | Freq: Every day | ORAL | Status: DC
  Administered 2022-07-17 – 2022-07-19 (×3): 10 mg via ORAL

## 2022-07-17 MED ORDER — ENSURE PRE-SURGERY PO LIQD
296.0000 mL | Freq: Once | ORAL | Status: AC
Start: 2022-07-17 — End: 2022-07-17
  Administered 2022-07-17: 296 mL via ORAL
  Filled 2022-07-17: qty 296

## 2022-07-17 MED ORDER — SALINE SPRAY 0.65 % NA SOLN: 1.0000 | NASAL | Status: DC | PRN

## 2022-07-17 MED ORDER — LACTATED RINGERS IV BOLUS
500.0000 mL | Freq: Once | INTRAVENOUS | Status: AC
  Administered 2022-07-17: 500 mL via INTRAVENOUS

## 2022-07-17 MED ORDER — CEFAZOLIN SODIUM-DEXTROSE 2-4 GM/100ML-% IV SOLN
INTRAVENOUS | Status: AC
  Filled 2022-07-17: qty 100

## 2022-07-17 MED ORDER — DEXMEDETOMIDINE HCL IN NACL 80 MCG/20ML IV SOLN
INTRAVENOUS | Status: AC
  Filled 2022-07-17: qty 20

## 2022-07-17 MED ORDER — TRAMADOL HCL 50 MG PO TABS
50.0000 mg | ORAL_TABLET | ORAL | Status: DC | PRN
  Administered 2022-07-17 – 2022-07-20 (×6): 50 mg via ORAL

## 2022-07-17 MED ORDER — ORAL CARE MOUTH RINSE: 15.0000 mL | Freq: Once | OROMUCOSAL | Status: AC

## 2022-07-17 MED ORDER — ACETAMINOPHEN 325 MG PO TABS
325.0000 mg | ORAL_TABLET | Freq: Four times a day (QID) | ORAL | Status: DC | PRN
  Administered 2022-07-19 – 2022-07-20 (×3): 650 mg via ORAL

## 2022-07-17 MED ORDER — FENTANYL CITRATE (PF) 100 MCG/2ML IJ SOLN
25.0000 ug | INTRAMUSCULAR | Status: DC | PRN
  Administered 2022-07-17 (×2): 25 ug via INTRAVENOUS

## 2022-07-17 MED ORDER — AMLODIPINE BESYLATE 5 MG PO TABS: 2.5000 mg | ORAL_TABLET | Freq: Every day | ORAL | Status: DC

## 2022-07-17 MED ORDER — CHLORHEXIDINE GLUCONATE 0.12 % MT SOLN
OROMUCOSAL | Status: AC
  Filled 2022-07-17: qty 15

## 2022-07-17 MED ORDER — PANTOPRAZOLE SODIUM 40 MG PO TBEC
40.0000 mg | DELAYED_RELEASE_TABLET | Freq: Two times a day (BID) | ORAL | Status: DC
  Administered 2022-07-17 – 2022-07-20 (×5): 40 mg via ORAL

## 2022-07-17 MED ORDER — GLYCOPYRROLATE 0.2 MG/ML IJ SOLN
INTRAMUSCULAR | Status: AC
  Filled 2022-07-17: qty 1

## 2022-07-17 MED ORDER — TRANEXAMIC ACID-NACL 1000-0.7 MG/100ML-% IV SOLN
INTRAVENOUS | Status: AC
  Filled 2022-07-17: qty 100

## 2022-07-17 MED ORDER — TRAMADOL HCL 50 MG PO TABS
ORAL_TABLET | ORAL | Status: AC
  Filled 2022-07-17: qty 1

## 2022-07-17 MED ORDER — ACETAMINOPHEN 10 MG/ML IV SOLN: 1000.0000 mg | Freq: Once | INTRAVENOUS | Status: DC | PRN

## 2022-07-17 MED ORDER — OXYCODONE HCL 5 MG PO TABS
5.0000 mg | ORAL_TABLET | ORAL | Status: DC | PRN
  Administered 2022-07-17 – 2022-07-18 (×4): 5 mg via ORAL

## 2022-07-17 MED ORDER — ENOXAPARIN SODIUM 40 MG/0.4ML IJ SOSY
40.0000 mg | PREFILLED_SYRINGE | INTRAMUSCULAR | Status: DC
  Administered 2022-07-18 – 2022-07-20 (×3): 40 mg via SUBCUTANEOUS

## 2022-07-17 MED ORDER — CEFAZOLIN SODIUM-DEXTROSE 2-4 GM/100ML-% IV SOLN
2.0000 g | Freq: Four times a day (QID) | INTRAVENOUS | Status: AC
  Administered 2022-07-17 (×2): 2 g via INTRAVENOUS

## 2022-07-17 MED ORDER — MAGNESIUM HYDROXIDE 400 MG/5ML PO SUSP
30.0000 mL | Freq: Every day | ORAL | Status: DC
  Administered 2022-07-18 – 2022-07-20 (×3): 30 mL via ORAL

## 2022-07-17 MED ORDER — PANTOPRAZOLE SODIUM 40 MG PO TBEC
DELAYED_RELEASE_TABLET | ORAL | Status: AC
  Filled 2022-07-17: qty 1

## 2022-07-17 MED ORDER — MENTHOL 3 MG MT LOZG
1.0000 | LOZENGE | OROMUCOSAL | Status: DC | PRN
  Administered 2022-07-17 (×2): 3 mg via ORAL
  Filled 2022-07-17: qty 9

## 2022-07-17 MED ORDER — ONDANSETRON HCL 4 MG/2ML IJ SOLN
INTRAMUSCULAR | Status: AC
  Filled 2022-07-17: qty 2

## 2022-07-17 MED ORDER — OXYCODONE HCL 5 MG PO TABS: 10.0000 mg | ORAL_TABLET | ORAL | Status: DC | PRN

## 2022-07-17 MED ORDER — ACETAMINOPHEN 10 MG/ML IV SOLN
INTRAVENOUS | Status: DC | PRN
  Administered 2022-07-17: 1000 mg via INTRAVENOUS

## 2022-07-17 MED ORDER — PHENYLEPHRINE HCL-NACL 20-0.9 MG/250ML-% IV SOLN
INTRAVENOUS | Status: DC | PRN
  Administered 2022-07-17: 50 ug/min via INTRAVENOUS

## 2022-07-17 MED ORDER — PROPOFOL 500 MG/50ML IV EMUL
INTRAVENOUS | Status: DC | PRN
  Administered 2022-07-17: 100 ug/kg/min via INTRAVENOUS

## 2022-07-17 MED ORDER — BUPROPION HCL ER (SR) 150 MG PO TB12
150.0000 mg | ORAL_TABLET | Freq: Every day | ORAL | Status: DC
  Administered 2022-07-17: 150 mg via ORAL
  Filled 2022-07-17 (×3): qty 1

## 2022-07-17 MED ORDER — CHLORHEXIDINE GLUCONATE 4 % EX SOLN
60.0000 mL | Freq: Once | CUTANEOUS | Status: AC
  Administered 2022-07-17: 4 via TOPICAL

## 2022-07-17 MED ORDER — LACTATED RINGERS IV SOLN: INTRAVENOUS | Status: DC

## 2022-07-17 MED ORDER — ALUM & MAG HYDROXIDE-SIMETH 200-200-20 MG/5ML PO SUSP: 30.0000 mL | ORAL | Status: DC | PRN

## 2022-07-17 MED ORDER — MIDAZOLAM HCL 2 MG/2ML IJ SOLN
INTRAMUSCULAR | Status: AC
  Filled 2022-07-17: qty 2

## 2022-07-17 MED ORDER — FAMOTIDINE 20 MG PO TABS
ORAL_TABLET | ORAL | Status: AC
  Filled 2022-07-17: qty 1

## 2022-07-17 MED ORDER — OXYCODONE HCL 5 MG PO TABS
ORAL_TABLET | ORAL | Status: AC
  Filled 2022-07-17: qty 1

## 2022-07-17 MED ORDER — HYDROMORPHONE HCL 1 MG/ML IJ SOLN: 0.5000 mg | INTRAMUSCULAR | Status: DC | PRN

## 2022-07-17 MED ORDER — BISACODYL 10 MG RE SUPP: 10.0000 mg | Freq: Every day | RECTAL | Status: DC | PRN

## 2022-07-17 MED ORDER — CELECOXIB 200 MG PO CAPS
ORAL_CAPSULE | ORAL | Status: AC
  Filled 2022-07-17: qty 1

## 2022-07-17 MED ORDER — ONDANSETRON HCL 4 MG/2ML IJ SOLN
INTRAMUSCULAR | Status: DC | PRN
  Administered 2022-07-17: 4 mg via INTRAVENOUS

## 2022-07-17 MED ORDER — METOCLOPRAMIDE HCL 10 MG PO TABS
ORAL_TABLET | ORAL | Status: AC
  Filled 2022-07-17: qty 1

## 2022-07-17 MED ORDER — SODIUM CHLORIDE 0.9 % IR SOLN
Status: DC | PRN
  Administered 2022-07-17: 3000 mL

## 2022-07-17 MED ORDER — OXYCODONE HCL 5 MG/5ML PO SOLN: 5.0000 mg | Freq: Once | ORAL | Status: DC | PRN

## 2022-07-17 MED ORDER — 0.9 % SODIUM CHLORIDE (POUR BTL) OPTIME
TOPICAL | Status: DC | PRN
  Administered 2022-07-17: 1000 mL

## 2022-07-17 MED ORDER — PHENYLEPHRINE HCL-NACL 20-0.9 MG/250ML-% IV SOLN
INTRAVENOUS | Status: AC
  Filled 2022-07-17: qty 250

## 2022-07-17 MED ORDER — TRANEXAMIC ACID-NACL 1000-0.7 MG/100ML-% IV SOLN
1000.0000 mg | Freq: Once | INTRAVENOUS | Status: AC
  Administered 2022-07-17: 1000 mg via INTRAVENOUS

## 2022-07-17 MED ORDER — DEXAMETHASONE SODIUM PHOSPHATE 10 MG/ML IJ SOLN
8.0000 mg | Freq: Once | INTRAMUSCULAR | Status: AC
  Administered 2022-07-17: 8 mg via INTRAVENOUS

## 2022-07-17 MED ORDER — SENNOSIDES-DOCUSATE SODIUM 8.6-50 MG PO TABS
ORAL_TABLET | ORAL | Status: AC
  Filled 2022-07-17: qty 1

## 2022-07-17 MED ORDER — GABAPENTIN 300 MG PO CAPS
300.0000 mg | ORAL_CAPSULE | Freq: Once | ORAL | Status: AC
  Administered 2022-07-17: 300 mg via ORAL

## 2022-07-17 MED ORDER — CELECOXIB 200 MG PO CAPS
ORAL_CAPSULE | ORAL | Status: AC
  Filled 2022-07-17: qty 2

## 2022-07-17 MED ORDER — ALBUTEROL SULFATE (2.5 MG/3ML) 0.083% IN NEBU: 3.0000 mL | INHALATION_SOLUTION | Freq: Four times a day (QID) | RESPIRATORY_TRACT | Status: DC | PRN

## 2022-07-17 MED ORDER — SODIUM CHLORIDE 0.9 % IV SOLN: INTRAVENOUS | Status: DC

## 2022-07-17 MED ORDER — PROPOFOL 1000 MG/100ML IV EMUL
INTRAVENOUS | Status: AC
  Filled 2022-07-17: qty 100

## 2022-07-17 MED ORDER — BUPIVACAINE HCL (PF) 0.5 % IJ SOLN
INTRAMUSCULAR | Status: DC | PRN
  Administered 2022-07-17: 3 mL

## 2022-07-17 MED ORDER — FAMOTIDINE 20 MG PO TABS
20.0000 mg | ORAL_TABLET | Freq: Once | ORAL | Status: AC
  Administered 2022-07-17: 20 mg via ORAL

## 2022-07-17 MED ORDER — FENTANYL CITRATE (PF) 100 MCG/2ML IJ SOLN
INTRAMUSCULAR | Status: AC
  Filled 2022-07-17: qty 2

## 2022-07-17 MED ORDER — HYOSCYAMINE SULFATE 0.125 MG PO TBDP: 0.1250 mg | ORAL_TABLET | ORAL | Status: DC | PRN

## 2022-07-17 MED ORDER — DIPHENHYDRAMINE HCL 12.5 MG/5ML PO ELIX: 12.5000 mg | ORAL_SOLUTION | ORAL | Status: DC | PRN

## 2022-07-17 MED ORDER — ONDANSETRON HCL 4 MG/2ML IJ SOLN
4.0000 mg | Freq: Four times a day (QID) | INTRAMUSCULAR | Status: DC | PRN
  Administered 2022-07-17: 4 mg via INTRAVENOUS

## 2022-07-17 MED ORDER — CELECOXIB 200 MG PO CAPS: 400.0000 mg | ORAL_CAPSULE | Freq: Once | ORAL | Status: DC

## 2022-07-17 MED ORDER — CELECOXIB 200 MG PO CAPS
200.0000 mg | ORAL_CAPSULE | Freq: Two times a day (BID) | ORAL | Status: DC
  Administered 2022-07-17 – 2022-07-20 (×7): 200 mg via ORAL

## 2022-07-17 MED ORDER — MECLIZINE HCL 25 MG PO TABS
12.5000 mg | ORAL_TABLET | Freq: Three times a day (TID) | ORAL | Status: DC | PRN
  Administered 2022-07-17 – 2022-07-18 (×3): 12.5 mg via ORAL
  Filled 2022-07-17 (×5): qty 0.5

## 2022-07-17 MED ORDER — CEFAZOLIN SODIUM-DEXTROSE 2-4 GM/100ML-% IV SOLN
2.0000 g | Freq: Four times a day (QID) | INTRAVENOUS | Status: DC
  Filled 2022-07-17 (×2): qty 100

## 2022-07-17 MED ORDER — OXYCODONE HCL 5 MG PO TABS: 5.0000 mg | ORAL_TABLET | Freq: Once | ORAL | Status: DC | PRN

## 2022-07-17 MED ORDER — AMLODIPINE BESYLATE 5 MG PO TABS
ORAL_TABLET | ORAL | Status: AC
  Filled 2022-07-17: qty 1

## 2022-07-17 MED ORDER — ACETAMINOPHEN 10 MG/ML IV SOLN
1000.0000 mg | Freq: Four times a day (QID) | INTRAVENOUS | Status: AC
  Administered 2022-07-17 – 2022-07-18 (×3): 1000 mg via INTRAVENOUS

## 2022-07-17 MED ORDER — FLEET ENEMA 7-19 GM/118ML RE ENEM: 1.0000 | ENEMA | Freq: Once | RECTAL | Status: DC | PRN

## 2022-07-17 MED ORDER — SURGIRINSE WOUND IRRIGATION SYSTEM - OPTIME
TOPICAL | Status: DC | PRN
  Administered 2022-07-17: 451 mL

## 2022-07-17 MED ORDER — CEFAZOLIN SODIUM-DEXTROSE 2-4 GM/100ML-% IV SOLN
2.0000 g | INTRAVENOUS | Status: AC
  Administered 2022-07-17: 2 g via INTRAVENOUS

## 2022-07-17 MED ORDER — DEXMEDETOMIDINE HCL IN NACL 80 MCG/20ML IV SOLN
INTRAVENOUS | Status: DC | PRN
  Administered 2022-07-17: 8 ug via INTRAVENOUS
  Administered 2022-07-17: 4 ug via INTRAVENOUS

## 2022-07-17 MED ORDER — GABAPENTIN 300 MG PO CAPS
ORAL_CAPSULE | ORAL | Status: AC
  Filled 2022-07-17: qty 1

## 2022-07-17 MED ORDER — DEXAMETHASONE SODIUM PHOSPHATE 10 MG/ML IJ SOLN
INTRAMUSCULAR | Status: AC
  Filled 2022-07-17: qty 1

## 2022-07-17 MED ORDER — GLYCOPYRROLATE 0.2 MG/ML IJ SOLN
INTRAMUSCULAR | Status: DC | PRN
  Administered 2022-07-17: .2 mg via INTRAVENOUS

## 2022-07-17 MED ORDER — TRANEXAMIC ACID-NACL 1000-0.7 MG/100ML-% IV SOLN: 1000.0000 mg | INTRAVENOUS | Status: DC

## 2022-07-17 MED ORDER — CHLORHEXIDINE GLUCONATE 0.12 % MT SOLN
15.0000 mL | Freq: Once | OROMUCOSAL | Status: AC
  Administered 2022-07-17: 15 mL via OROMUCOSAL

## 2022-07-17 SURGICAL SUPPLY — 55 items
ARTICULEZE HEAD (Hips) ×1 IMPLANT
BLADE SAW 90X25X1.19 OSCILLAT (BLADE) ×1 IMPLANT
BRUSH SCRUB EZ PLAIN DRY (MISCELLANEOUS) IMPLANT
CUP ACETBLR 52 OD 100 SERIES (Hips) IMPLANT
DRAPE 3/4 80X56 (DRAPES) ×1 IMPLANT
DRAPE INCISE IOBAN 66X60 STRL (DRAPES) ×1 IMPLANT
DRSG AQUACEL AG ADV 3.5X14 (GAUZE/BANDAGES/DRESSINGS) ×1 IMPLANT
DRSG DERMACEA 8X12 NADH (GAUZE/BANDAGES/DRESSINGS) IMPLANT
DRSG MEPILEX SACRM 8.7X9.8 (GAUZE/BANDAGES/DRESSINGS) ×1 IMPLANT
DRSG NON-ADHERENT DERMACEA 3X4 (GAUZE/BANDAGES/DRESSINGS) ×1 IMPLANT
DRSG TEGADERM 4X4.75 (GAUZE/BANDAGES/DRESSINGS) ×1 IMPLANT
DURAPREP 26ML APPLICATOR (WOUND CARE) ×2 IMPLANT
ELECT CAUTERY BLADE 6.4 (BLADE) ×1 IMPLANT
ELECT REM PT RETURN 9FT ADLT (ELECTROSURGICAL) ×1
ELECTRODE REM PT RTRN 9FT ADLT (ELECTROSURGICAL) ×1 IMPLANT
GLOVE BIOGEL M STRL SZ7.5 (GLOVE) ×2 IMPLANT
GLOVE SRG 8 PF TXTR STRL LF DI (GLOVE) ×2 IMPLANT
GLOVE SURG SYN 8.0 (GLOVE) ×2 IMPLANT
GLOVE SURG SYN 8.0 PF PI (GLOVE) ×2 IMPLANT
GLOVE SURG UNDER POLY LF SZ8 (GLOVE) ×2
GOWN STRL REUS W/ TWL LRG LVL3 (GOWN DISPOSABLE) ×2 IMPLANT
GOWN STRL REUS W/ TWL XL LVL3 (GOWN DISPOSABLE) ×1 IMPLANT
GOWN STRL REUS W/TWL LRG LVL3 (GOWN DISPOSABLE) ×2
GOWN STRL REUS W/TWL XL LVL3 (GOWN DISPOSABLE) ×1
GOWN TOGA ZIPPER T7+ PEEL AWAY (MISCELLANEOUS) ×1 IMPLANT
HANDLE YANKAUER SUCT OPEN TIP (MISCELLANEOUS) ×1 IMPLANT
HEAD ARTICULEZE (Hips) IMPLANT
HEMOVAC 400CC 10FR (MISCELLANEOUS) ×1 IMPLANT
HOLDER FOLEY CATH W/STRAP (MISCELLANEOUS) ×1 IMPLANT
HOOD PEEL AWAY T7 (MISCELLANEOUS) ×1 IMPLANT
IV NS IRRIG 3000ML ARTHROMATIC (IV SOLUTION) ×1 IMPLANT
KIT PEG BOARD PINK (KITS) ×1 IMPLANT
KIT TURNOVER KIT A (KITS) ×1 IMPLANT
LINER NEUTRAL 52X36MM PLUS 4 (Liner) IMPLANT
MANIFOLD NEPTUNE II (INSTRUMENTS) ×2 IMPLANT
NS IRRIG 500ML POUR BTL (IV SOLUTION) ×1 IMPLANT
PACK HIP PROSTHESIS (MISCELLANEOUS) ×1 IMPLANT
PULSAVAC PLUS IRRIG FAN TIP (DISPOSABLE) ×1
SOL PREP PVP 2OZ (MISCELLANEOUS) ×1
SOLUTION IRRIG SURGIPHOR (IV SOLUTION) ×1 IMPLANT
SOLUTION PREP PVP 2OZ (MISCELLANEOUS) ×1 IMPLANT
SPONGE DRAIN TRACH 4X4 STRL 2S (GAUZE/BANDAGES/DRESSINGS) ×1 IMPLANT
STAPLER SKIN PROX 35W (STAPLE) ×1 IMPLANT
STEM FEMORAL SZ 5MM STD ACTIS (Stem) IMPLANT
SUT ETHIBOND #5 BRAIDED 30INL (SUTURE) ×1 IMPLANT
SUT VIC AB 0 CT1 36 (SUTURE) ×2 IMPLANT
SUT VIC AB 1 CT1 36 (SUTURE) ×2 IMPLANT
SUT VIC AB 2-0 CT1 27 (SUTURE) ×1
SUT VIC AB 2-0 CT1 TAPERPNT 27 (SUTURE) ×1 IMPLANT
TAPE CLOTH 3X10 WHT NS LF (GAUZE/BANDAGES/DRESSINGS) ×1 IMPLANT
TIP FAN IRRIG PULSAVAC PLUS (DISPOSABLE) ×1 IMPLANT
TOWEL OR 17X26 4PK STRL BLUE (TOWEL DISPOSABLE) IMPLANT
TRAP FLUID SMOKE EVACUATOR (MISCELLANEOUS) ×1 IMPLANT
TRAY FOLEY MTR SLVR 16FR STAT (SET/KITS/TRAYS/PACK) ×1 IMPLANT
WATER STERILE IRR 1000ML POUR (IV SOLUTION) ×1 IMPLANT

## 2022-07-17 NOTE — Evaluation (Signed)
Physical Therapy Evaluation Patient Details Name: Laura Love MRN: [id number] DOB: 07-28-1940 Today's Date: 07/17/2022  History of Present Illness  Roschelle Batta is an 81yoF who comes to [facility] on 07/17/22 for elective THA with Dr. Ernest Pine. Pt lives at the [facility] [place].  Clinical Impression  Pt in bay 23, RN reports significant issues with dizziness, vertigo past couple hours, VSS. Upon entry, pt reports recent emesis which led to resolution of nausea and vertigo. She describes a baseline intolerance to narcotic pain medications. Pt screened for orthostatic vitals, which appear WNL and are asymptomatic at this time. Pt able to move to EOB without any physical assistance, but used bed features available on her own bed at home. Pt able to come to standing twice with heavy effort, not limited significantly by "bad knees" as predicted, but is heavily dependent on arms. Pt tolerates ~10 total feet of walking, likely could've tolerated a bit more, but stops frequently for conjecture, concern, or questions. Pt encouraged to make the most of her vertical time. Around 95ft, she ultimate becomes concerned by the degree of returning nausea/vertigo, but makes it safely to the recliner. Pt sat up at EOB, all needs met. Symptoms resolved once legs elevated and feet slightly reclined. Will continue to follow.         Recommendations for follow up therapy are one component of a multi-disciplinary discharge planning process, led by the attending physician.  Recommendations may be updated based on patient status, additional functional criteria and insurance authorization.  Follow Up Recommendations       Assistance Recommended at Discharge Set up Supervision/Assistance  Patient can return home with the following  A lot of help with walking and/or transfers;Assistance with cooking/housework    Equipment Recommendations Rolling walker (2 wheels);BSC/3in1  Recommendations for Other Services        Functional Status Assessment Patient has had a recent decline in their functional status and demonstrates the ability to make significant improvements in function in a reasonable and predictable amount of time.     Precautions / Restrictions Precautions Precautions: Posterior Hip Precaution Booklet Issued: No Restrictions Weight Bearing Restrictions: Yes LLE Weight Bearing: Weight bearing as tolerated      Mobility  Bed Mobility Overal bed mobility: Needs Assistance Bed Mobility: Supine to Sit     Supine to sit: Supervision          Transfers Overall transfer level: Needs assistance Equipment used: Rolling walker (2 wheels) Transfers: Sit to/from Stand Sit to Stand: Min guard           General transfer comment: max effort, cues for precautions, minGuard due to impulisvity trigger by urge incontinence that takes precedent over the need to stand safely with RW    Ambulation/Gait Ambulation/Gait assistance: Min guard Gait Distance (Feet): 10 Feet Assistive device: Rolling walker (2 wheels) Gait Pattern/deviations: Step-to pattern       General Gait Details: follow cues for turning, stepping forward, backward Right left; then vertigo/nausea begin to return and making it to recliner becomes priority.  Stairs            Wheelchair Mobility    Modified Rankin (Stroke Patients Only)       Balance                                             Pertinent Vitals/Pain Pain Assessment  Pain Assessment: 0-10 Pain Score: 4  Pain Location: Left hip, near incision Pain Descriptors / Indicators: Aching Pain Intervention(s): Limited activity within patient's tolerance, Monitored during session, Premedicated before session    Home Living Family/patient expects to be discharged to:: Skilled nursing facility Living Arrangements: Alone   Type of Home: Independent living facility Home Access: Level entry       Home Layout: One level Home  Equipment:  (Not sure)      Prior Function Prior Level of Function : Independent/Modified Independent                     Hand Dominance        Extremity/Trunk Assessment                Communication      Cognition Arousal/Alertness: Awake/alert Behavior During Therapy: WFL for tasks assessed/performed Overall Cognitive Status: Within Functional Limits for tasks assessed                                          General Comments      Exercises Total Joint Exercises Short Arc Quad: AROM, Both, 10 reps, Supine   Assessment/Plan    PT Assessment Patient needs continued PT services  PT Problem List Decreased strength;Decreased range of motion;Decreased activity tolerance;Decreased balance;Decreased mobility;Decreased coordination;Decreased cognition       PT Treatment Interventions DME instruction;Gait training;Stair training;Functional mobility training;Therapeutic activities;Therapeutic exercise;Balance training;Neuromuscular re-education;Patient/family education    PT Goals (Current goals can be found in the Care Plan section)  Acute Rehab PT Goals Patient Stated Goal: return to ILF; PT Goal Formulation: With patient Time For Goal Achievement: 07/31/22 Potential to Achieve Goals: Good    Frequency BID     Co-evaluation               AM-PAC PT "6 Clicks" Mobility  Outcome Measure Help needed turning from your back to your side while in a flat bed without using bedrails?: None Help needed moving from lying on your back to sitting on the side of a flat bed without using bedrails?: None Help needed moving to and from a bed to a chair (including a wheelchair)?: A Little Help needed standing up from a chair using your arms (e.g., wheelchair or bedside chair)?: A Little Help needed to walk in hospital room?: A Little Help needed climbing 3-5 steps with a railing? : A Lot 6 Click Score: 19    End of Session   Activity Tolerance:  Treatment limited secondary to medical complications (Comment) Patient left: in chair;with call bell/phone within reach Nurse Communication: Mobility status PT Visit Diagnosis: Unsteadiness on feet (R26.81);Difficulty in walking, not elsewhere classified (R26.2);Other abnormalities of gait and mobility (R26.89);Repeated falls (R29.6);Muscle weakness (generalized) (M62.81);Other symptoms and signs involving the nervous system (R29.898)    Time: 1610-9604 PT Time Calculation (min) (ACUTE ONLY): 40 min   Charges:   PT Evaluation $PT Eval Moderate Complexity: 1 Mod PT Treatments $Gait Training: 8-22 mins $Therapeutic Activity: 8-22 mins       4:19 PM, 07/17/22 Rosamaria Lints, PT, DPT Physical Therapist - [facility]  [phone] (ASCOM)    Lunabella Badgett C 07/17/2022, 4:14 PM

## 2022-07-17 NOTE — Anesthesia Postprocedure Evaluation (Signed)
Anesthesia Post Note  Patient: Laura Love  Procedure(s) Performed: TOTAL HIP ARTHROPLASTY (Left: Hip)  Patient location during evaluation: PACU Anesthesia Type: Spinal Level of consciousness: awake and alert, oriented and patient cooperative Pain management: pain level controlled Vital Signs Assessment: post-procedure vital signs reviewed and stable Respiratory status: spontaneous breathing, nonlabored ventilation and respiratory function stable Cardiovascular status: blood pressure returned to baseline and stable Postop Assessment: adequate PO intake, no headache, no backache and spinal receding Anesthetic complications: no   No notable events documented.   Last Vitals:  Vitals:   07/17/22 1100 07/17/22 1115  BP: 124/61 (!) 113/48  Pulse: 71 70  Resp: 20 15  Temp: (!) 36.1 C   SpO2: 100% 99%    Last Pain:  Vitals:   07/17/22 1108  TempSrc:   PainSc: 6                  Reed Breech

## 2022-07-17 NOTE — Interval H&P Note (Signed)
History and Physical Interval Note:  07/17/2022 6:59 AM  Laura Love  has presented today for surgery, with the diagnosis of PRIMARY OSTEOARTHRITIS OF LEFT HIP.Marland Kitchen  The various methods of treatment have been discussed with the patient and family. After consideration of risks, benefits and other options for treatment, the patient has consented to  Procedure(s): TOTAL HIP ARTHROPLASTY (Left) as a surgical intervention.  The patient's history has been reviewed, patient examined, no change in status, stable for surgery.  I have reviewed the patient's chart and labs.  Questions were answered to the patient's satisfaction.     Fatoumata Albaugh P Khalise Billard

## 2022-07-17 NOTE — Anesthesia Preprocedure Evaluation (Addendum)
Anesthesia Evaluation  Patient identified by MRN, date of birth, ID band Patient awake    Reviewed: Allergy & Precautions, NPO status , Patient's Chart, lab work & pertinent test results  History of Anesthesia Complications (+) PONV and history of anesthetic complications  Airway Mallampati: III   Neck ROM: Full    Dental   Bridges :   Pulmonary former smoker (quit 1970)   Pulmonary exam normal breath sounds clear to auscultation       Cardiovascular hypertension, Normal cardiovascular exam Rhythm:Regular Rate:Normal  ECG 07/10/22: normal   Neuro/Psych  PSYCHIATRIC DISORDERS Anxiety Depression    negative neurological ROS     GI/Hepatic negative GI ROS,,,  Endo/Other  Hypothyroidism    Renal/GU negative Renal ROS     Musculoskeletal  (+) Arthritis ,    Abdominal   Peds  Hematology negative hematology ROS (+)   Anesthesia Other Findings   Reproductive/Obstetrics                             Anesthesia Physical Anesthesia Plan  ASA: 2  Anesthesia Plan: General and Spinal   Post-op Pain Management:    Induction: Intravenous  PONV Risk Score and Plan: 4 or greater and Propofol infusion, TIVA, Treatment may vary due to age or medical condition, Ondansetron and Dexamethasone  Airway Management Planned: Natural Airway and Nasal Cannula  Additional Equipment:   Intra-op Plan:   Post-operative Plan:   Informed Consent: I have reviewed the patients History and Physical, chart, labs and discussed the procedure including the risks, benefits and alternatives for the proposed anesthesia with the patient or authorized representative who has indicated his/her understanding and acceptance.       Plan Discussed with: CRNA  Anesthesia Plan Comments: (Plan for spinal and GA with natural airway, LMA/GETA backup.  Patient consented for risks of anesthesia including but not limited to:  -  adverse reactions to medications - damage to eyes, teeth, lips or other oral mucosa - nerve damage due to positioning  - sore throat or hoarseness - headache, bleeding, infection, nerve damage 2/2 spinal - damage to heart, brain, nerves, lungs, other parts of body or loss of life  Informed patient about role of CRNA in peri- and intra-operative care.  Patient voiced understanding.)        Anesthesia Quick Evaluation

## 2022-07-17 NOTE — Anesthesia Procedure Notes (Signed)
Spinal  Patient location during procedure: OR Start time: 07/17/2022 7:26 AM End time: 07/17/2022 7:29 AM Reason for block: surgical anesthesia Staffing Performed: resident/CRNA  Anesthesiologist: Reed Breech, MD Resident/CRNA: Hezzie Bump, CRNA Performed by: Hezzie Bump, CRNA Authorized by: Reed Breech, MD   Preanesthetic Checklist Completed: patient identified, IV checked, site marked, risks and benefits discussed, surgical consent, monitors and equipment checked, pre-op evaluation and timeout performed Spinal Block Patient position: sitting Prep: Betadine Patient monitoring: heart rate, continuous pulse ox, blood pressure and cardiac monitor Approach: midline Location: L4-5 Injection technique: single-shot Needle Needle type: Whitacre and Introducer  Needle gauge: 24 G Needle length: 9 cm Assessment Events: CSF return Additional Notes Negative paresthesia. Negative blood return. Positive free-flowing CSF. Expiration date of kit checked and confirmed. Patient tolerated procedure well, without complications.

## 2022-07-17 NOTE — TOC Progression Note (Signed)
Transition of Care [facility]) - Progression Note    Patient Details  Name: Laura Love MRN: [id number] Date of Birth: 08/09/1940  Transition of Care [facility]) CM/SW Contact  Marlowe Sax, RN Phone Number: 07/17/2022, 11:04 AM  Clinical Narrative:   Sherron Monday with Cala Bradford at [facility] [place]  the patient lives in [place] [facility] [place], She plans to go to SNF at [facility] [place], Ins auth will need to be obtained PT will evaluate [place] will provide transportation          Expected Discharge Plan and Services                                               Social Determinants of Health (SDOH) Interventions SDOH Screenings   Food Insecurity: No Food Insecurity (04/20/2020)  Housing: Low Risk  (04/20/2020)  Transportation Needs: No Transportation Needs (04/20/2020)  Alcohol Screen: Low Risk  (05/12/2022)  Depression (PHQ2-9): Low Risk  (05/12/2022)  Financial Resource Strain: Low Risk  (04/20/2020)  Physical Activity: Inactive (04/20/2020)  Social Connections: Moderately Isolated (04/20/2020)  Stress: Stress Concern Present (04/20/2020)  Tobacco Use: Medium Risk (07/17/2022)    Readmission Risk Interventions     No data to display

## 2022-07-17 NOTE — H&P (Signed)
ORTHOPAEDIC HISTORY & PHYSICAL Latanya Maudlin, PA - 07/12/2022 11:00 AM EDT Formatting of this note is different from the original. Images from the original note were not included. Chief Complaint Chief Complaint Patient presents with Left hip degenerative arthrosis H&P for left total hip arthroplasty 07/17/22  Reason for Visit Laura Love is a 82 y.o. who presents today for history and physical. She is to undergo a left total hip arthroplasty on 07/17/2022. Since her last visit here to clinic there is been no change in her condition. Patient expresses her desire to proceed with surgery.  She reports a several year history of left hip and groin pain. She does not recall any trauma or aggravating event. The pain is aggravated by walking and arising from a seated position . She has appreciated a "catching" sensation with certain movements. The hip pain limits the patient's ability to ambulate long distances. The patient has not appreciated any significant improvement despite Tylenol, NSAIDs, and activity modification. She is not using any ambulatory aids. The patient states that the hip pain has progressed to the point that it is significantly interfering with her activities of daily living.  Past Medical History Past Medical History: Diagnosis Date Hypothyroidism (acquired) Osteoporosis  Past Surgical History Past Surgical History: Procedure Laterality Date EXTRACRANIAL RECONSTRUCTION FACIAL BONES hysterectomy lower back surgery Right knee arthroscopy right shoulder surgery  Past Family History Family History Problem Relation Age of Onset High blood pressure (Hypertension) Mother Osteoporosis (Thinning of bones) Mother Breast cancer Mother Hyperthyroidism Mother High blood pressure (Hypertension) Father Stroke Father High blood pressure (Hypertension) Sister Breast cancer Sister Hyperthyroidism Sister Osteoporosis (Thinning of bones) Maternal Grandmother Osteoporosis  (Thinning of bones) Paternal Grandmother  Medications Current Outpatient Medications Medication Sig Dispense Refill albuterol 90 mcg/actuation inhaler Inhale into the lungs Inhale 2 puffs into the lungs every 6 (six) hours as needed for wheezing or shortness of breath. ascorbic Acid (VITAMIN C) 500 mg CpER SR capsule Take 500 mg by mouth once daily biotin 1 mg tablet Take 1,000 mcg by mouth once daily budesonide-formoteroL (SYMBICORT) 160-4.5 mcg/actuation inhaler 1-2 puffs twice daily buPROPion (WELLBUTRIN SR) 150 MG SR tablet Take 150 mg by mouth 2 (two) times daily calcium carbonate-vitamin D3 (OS-CAL 500+D) 500 mg(1,250mg ) -400 unit tablet Take 1 tablet by mouth once daily cholecalciferol (VITAMIN D3) 1,000 unit capsule Take 1,000 Units by mouth once daily colestipoL (COLESTID) 5 gram granules Take 5 g by mouth 2 (two) times daily dicyclomine (BENTYL) 10 mg capsule diphenhydrAMINE (BENADRYL) 25 mg capsule Take by mouth Take 25 mg by mouth as needed ezetimibe (ZETIA) 10 mg tablet Take 1 tablet by mouth once daily ibuprofen 200 mg Cap Take 1 capsule by mouth 3 (three) times daily as needed meclizine (ANTIVERT) 25 mg tablet Take by mouth Take 1 tablet (25 mg total) by mouth 3 (three) times daily as needed for dizziness. melatonin 3 mg tablet Take 3 mg by mouth nightly sennosides-docusate (SENOKOT-S) 8.6-50 mg tablet Take 1 tablet by mouth once daily SYNTHROID 100 mcg tablet Take 1 tablet (100 mcg total) by mouth once daily 90 tablet 1 trolamine salicylate (ASPERCREME) 10 % cream Apply topically as needed zinc gluconate 50 mg tablet Take 50 mg by mouth once daily as needed celecoxib (CELEBREX) 100 MG capsule Take 1 capsule (100 mg total) by mouth once daily for 30 days After food 30 capsule 0  No current facility-administered medications for this visit.  Allergies Allergies Allergen Reactions Duloxetine Nausea Weakness, sick in bed for 2 days  Fosamax [Alendronate] Nausea Meperidine  Nausea And Vomiting Pentazocine Unknown Talwin-Pt does not remember med and indication Rosuvastatin Vomiting   Review of Systems A comprehensive 14 point ROS was performed, reviewed, and the pertinent orthopaedic findings are documented in the HPI.  Exam Ht 157.5 cm (5\' 2" )  Wt 59.4 kg (131 lb)  BMI 23.96 kg/m  General: Well-developed well-nourished female seen in no acute distress.  HEENT: Atraumatic,normocephalic. Pupils are equal and reactive to light. Oropharynx is clear with moist mucosa  Lungs: Clear to auscultation bilaterally  Cardiovascular: Regular rate and rhythm. Normal S1, S2. No murmurs. No appreciable gallops or rubs. Peripheral pulses are palpable.  Abdomen: Soft, non-tender, nondistended. Bowel sounds present  Extremity: Left Hip: Pelvic tilt: Negative Limb lengths: Equal with the patient standing Soft tissue swelling:Negative Erythema: Negative Crepitance: Mild Tenderness: Greater trochanter is nontender to palpation. Moderate pain is elicited by axial compression or extremes of rotation. Atrophy: No atrophy. Fair to good hip flexor and abductor strength. Range of Motion:EXT/FLEX: 0/0/110 ADD/ABD: 20/0/20 IR/ER: 20/0/30  Neurological:  The patient is alert and oriented Sensation to light touch appears to be intact and within normal limits Gross motor strength appeared to be equal to 5/5  Vascular :  Peripheral pulses felt to be palpable. Capillary refill appears to be intact and within normal limits  X-ray  1. 3 views of the left hip ordered and interpreted on today's visit shows superior migration of the femoral head in the acetabulum. Is noted to have irregularity of the shape of the head. She has bone-on-bone with subchondral sclerosis noted. She is noted to have significant osteophytes to the head of the femur. The osteophyte to the inferior aspect of the femoral head possibly was loose body. Because of the superior 6 migration of the femur  she is slightly subluxated.  Impression  1. Degenerative arthrosis left hip  Plan  1. I have gone over the patient's medication on today's visit 2. Past medical history was reviewed 3. Return to clinic 6 weeks postop. Sooner if any problems 4. Postop rehab course was discussed  This note was generated in part with voice recognition software and I apologize for any typographical errors that were not detected and corrected   Tera Partridge PA Electronically signed by Latanya Maudlin, PA at 07/12/2022 12:21 PM EDT

## 2022-07-17 NOTE — Anesthesia Procedure Notes (Signed)
Procedure Name: MAC Date/Time: 07/17/2022 7:30 AM  Performed by: Hezzie Bump, CRNAPre-anesthesia Checklist: Patient identified, Emergency Drugs available, Suction available and Patient being monitored Patient Re-evaluated:Patient Re-evaluated prior to induction Oxygen Delivery Method: Nasal cannula Induction Type: IV induction Placement Confirmation: positive ETCO2

## 2022-07-17 NOTE — Transfer of Care (Signed)
Immediate Anesthesia Transfer of Care Note  Patient: Laura Love  Procedure(s) Performed: TOTAL HIP ARTHROPLASTY (Left: Hip)  Patient Location: PACU  Anesthesia Type:Spinal  Level of Consciousness: drowsy  Airway & Oxygen Therapy: Patient Spontanous Breathing  Post-op Assessment: Report given to RN and Post -op Vital signs reviewed and stable  Post vital signs: Reviewed and stable  Last Vitals:  Vitals Value Taken Time  BP 96/42 07/17/22 1030  Temp    Pulse 76 07/17/22 1041  Resp 15 07/17/22 1041  SpO2 99 % 07/17/22 1041  Vitals shown include unvalidated device data.  Last Pain:  Vitals:   07/17/22 0626  TempSrc: Temporal  PainSc: 0-No pain         Complications: No notable events documented.

## 2022-07-17 NOTE — Op Note (Signed)
OPERATIVE NOTE  DATE OF SURGERY:  07/17/2022  PATIENT NAME:  Laura Love   DOB: 1941-01-14  MRN: [id number]  PRE-OPERATIVE DIAGNOSIS: Degenerative arthrosis of the left hip, primary  POST-OPERATIVE DIAGNOSIS:  Same  PROCEDURE:  Left total hip arthroplasty  SURGEON:  Jena Gauss. M.D.  ASSISTANT:  Gean Birchwood, PA-C (present and scrubbed throughout the case, critical for assistance with exposure, retraction, instrumentation, and closure)  ANESTHESIA: spinal  ESTIMATED BLOOD LOSS: 75 mL  FLUIDS REPLACED: 1000 mL of crystalloid  DRAINS: 2 medium Hemovac drains  IMPLANTS UTILIZED: DePuy size 5 standard offset Actis femoral stem, 52 mm OD Pinnacle 100 acetabular component, +4 mm neutral Pinnacle Altrx polyethylene insert, and a 36 mm M-SPEC +5 mm hip ball  INDICATIONS FOR SURGERY: Laura Love is a 82 y.o. year old female with a long history of progressive hip and groin  pain. X-rays demonstrated severe degenerative changes. The patient had not seen any significant improvement despite conservative nonsurgical intervention. After discussion of the risks and benefits of surgical intervention, the patient expressed understanding of the risks benefits and agree with plans for total hip arthroplasty.   The risks, benefits, and alternatives were discussed at length including but not limited to the risks of infection, bleeding, nerve injury, stiffness, blood clots, the need for revision surgery, limb length inequality, dislocation, cardiopulmonary complications, among others, and they were willing to proceed.  PROCEDURE IN DETAIL: The patient was brought into the operating room and, after adequate spinal anesthesia was achieved, the patient was placed in a right lateral decubitus position. Axillary roll was placed and all bony prominences were well-padded. The patient's left hip was cleaned and prepped with alcohol and DuraPrep and draped in the usual sterile fashion. A "timeout" was  performed as per usual protocol. A lateral curvilinear incision was made gently curving towards the posterior superior iliac spine. The IT band was incised in line with the skin incision and the fibers of the gluteus maximus were split in line. The piriformis tendon was identified, skeletonized, and incised at its insertion to the proximal femur and reflected posteriorly. A T type posterior capsulotomy was performed. Prior to dislocation of the femoral head, a threaded Steinmann pin was inserted through a separate stab incision into the pelvis superior to the acetabulum and bent in the form of a stylus so as to assess limb length and hip offset throughout the procedure. The femoral head was then dislocated posteriorly. Inspection of the femoral head demonstrated severe degenerative changes with full-thickness loss of articular cartilage. The femoral neck cut was performed using an oscillating saw. The anterior capsule was elevated off of the femoral neck using a periosteal elevator. Attention was then directed to the acetabulum. The remnant of the labrum was excised using electrocautery. Inspection of the acetabulum also demonstrated significant degenerative changes. The acetabulum was reamed in sequential fashion up to a 51 mm diameter. Good punctate bleeding bone was encountered. A 52 mm Pinnacle 100 acetabular component was positioned and impacted into place. Good scratch fit was appreciated. A +4 mm neutral polyethylene trial was inserted.  Attention was then directed to the proximal femur.  Femoral broaches were inserted in a sequential fashion up to a size 5 broach. Calcar region was planed and a trial reduction was performed using a standard offset neck and a 36 mm hip ball with a +5 mm neck length. Good equalization of limb lengths and hip offset was appreciated and excellent stability was noted both anteriorly and  posteriorly. Trial components were removed. The acetabular shell was irrigated with copious  amounts of normal saline with antibiotic solution and suctioned dry. A +4 mm neutral Pinnacle Altrx polyethylene insert was positioned and impacted into place. Next, a size 5 standard offset Actis femoral stem was positioned and impacted into place. Excellent scratch fit was appreciated. A trial reduction was again performed with a 36 mm hip ball with a +5 mm neck length. Again, good equalization of limb lengths was appreciated and excellent stability appreciated both anteriorly and posteriorly. The hip was then dislocated and the trial hip ball was removed. The Morse taper was cleaned and dried. A 36 mm M-SPEC hip ball with a +5 mm neck length was placed on the trunnion and impacted into place. The hip was then reduced and placed through range of motion. Excellent stability was appreciated both anteriorly and posteriorly.  The wound was irrigated with copious amounts of normal saline followed by 450 ml of Surgiphor and suctioned dry. Good hemostasis was appreciated. The posterior capsulotomy was repaired using #5 Ethibond. Piriformis tendon was reapproximated to the undersurface of the gluteus medius tendon using #5 Ethibond. The IT band was reapproximated using interrupted sutures of #1 Vicryl. Subcutaneous tissue was approximated using first #0 Vicryl followed by #2-0 Vicryl. The skin was closed with skin staples.  The patient tolerated the procedure well and was transported to the recovery room in stable condition.   Jena Gauss., M.D.

## 2022-07-18 ENCOUNTER — Encounter: Payer: Self-pay | Admitting: Orthopedic Surgery

## 2022-07-18 DIAGNOSIS — Z79899 Other long term (current) drug therapy: Secondary | ICD-10-CM | POA: Diagnosis not present

## 2022-07-18 DIAGNOSIS — M1612 Unilateral primary osteoarthritis, left hip: Secondary | ICD-10-CM | POA: Diagnosis not present

## 2022-07-18 DIAGNOSIS — E039 Hypothyroidism, unspecified: Secondary | ICD-10-CM | POA: Diagnosis not present

## 2022-07-18 MED ORDER — METOCLOPRAMIDE HCL 10 MG PO TABS
ORAL_TABLET | ORAL | Status: AC
  Filled 2022-07-18: qty 1

## 2022-07-18 MED ORDER — OXYCODONE HCL 5 MG PO TABS
ORAL_TABLET | ORAL | Status: AC
  Filled 2022-07-18: qty 1

## 2022-07-18 MED ORDER — EZETIMIBE 10 MG PO TABS
ORAL_TABLET | ORAL | Status: AC
  Filled 2022-07-18: qty 1

## 2022-07-18 MED ORDER — ENOXAPARIN SODIUM 40 MG/0.4ML IJ SOSY
PREFILLED_SYRINGE | INTRAMUSCULAR | Status: AC
  Filled 2022-07-18: qty 0.4

## 2022-07-18 MED ORDER — CELECOXIB 200 MG PO CAPS
ORAL_CAPSULE | ORAL | Status: AC
  Filled 2022-07-18: qty 1

## 2022-07-18 MED ORDER — TRAMADOL HCL 50 MG PO TABS
ORAL_TABLET | ORAL | Status: AC
  Filled 2022-07-18: qty 1

## 2022-07-18 MED ORDER — SENNOSIDES-DOCUSATE SODIUM 8.6-50 MG PO TABS
ORAL_TABLET | ORAL | Status: AC
  Filled 2022-07-18: qty 1

## 2022-07-18 MED ORDER — PANTOPRAZOLE SODIUM 40 MG PO TBEC
DELAYED_RELEASE_TABLET | ORAL | Status: AC
  Filled 2022-07-18: qty 1

## 2022-07-18 MED ORDER — ACETAMINOPHEN 10 MG/ML IV SOLN
INTRAVENOUS | Status: AC
  Filled 2022-07-18: qty 100

## 2022-07-18 NOTE — Evaluation (Signed)
Occupational Therapy Evaluation Patient Details Name: Laura Love MRN: [id number] DOB: 08/22/1940 Today's Date: 07/18/2022   History of Present Illness Laura Love is an 81yoF who comes to [facility] on 07/17/22 for elective THA with Dr. Ernest Pine. Pt lives at the [address] of [place] ([place]).   Clinical Impression   Patient agreeable to OT evaluation. Pt presenting with decreased independence in self care, balance, functional mobility/transfers, and endurance. PTA pt lived at [facility], was Mod I for ADLs/IADLs, and Mod I for functional mobility using a rollator. Pt currently functioning at supervision for bed mobility, CGA for STS from EOB, and CGA for functional mobility at room level using a RW. Pt was educated on LLE WBAT, posterior hip precautions, and adaptive strategies for dressing including use of AE (reacher, sock aid). She required Min A overall for seated LB dressing while using AE and Min VC to maintain posterior hip precautions. Anticipate increased assistance required for standing ADL tasks (I.e. peri care, clothing management). Pt will benefit from skilled acute OT services to address deficits noted below. OT recommends ongoing therapy upon discharge to maximize safety and independence with ADLs, decrease fall risk, decrease caregiver burden, and promote return to PLOF.        Recommendations for follow up therapy are one component of a multi-disciplinary discharge planning process, led by the attending physician.  Recommendations may be updated based on patient status, additional functional criteria and insurance authorization.   Assistance Recommended at Discharge Intermittent Supervision/Assistance  Patient can return home with the following Assistance with cooking/housework;Assist for transportation;Help with stairs or ramp for entrance;A lot of help with bathing/dressing/bathroom;A little help with walking and/or transfers    Functional Status Assessment  Patient has  had a recent decline in their functional status and demonstrates the ability to make significant improvements in function in a reasonable and predictable amount of time.  Equipment Recommendations  Other (comment) (defer to next venue of care)    Recommendations for Other Services       Precautions / Restrictions Precautions Precautions: Posterior Hip;Fall Precaution Booklet Issued: Yes (comment) Restrictions Weight Bearing Restrictions: Yes LLE Weight Bearing: Weight bearing as tolerated      Mobility Bed Mobility Overal bed mobility: Needs Assistance Bed Mobility: Supine to Sit     Supine to sit: Supervision, HOB elevated          Transfers Overall transfer level: Needs assistance Equipment used: Rolling walker (2 wheels) Transfers: Sit to/from Stand Sit to Stand: Min guard           General transfer comment: VC for hand placement      Balance Overall balance assessment: Needs assistance Sitting-balance support: Feet supported Sitting balance-Leahy Scale: Good     Standing balance support: Bilateral upper extremity supported, During functional activity Standing balance-Leahy Scale: Fair       ADL either performed or assessed with clinical judgement   ADL Overall ADL's : Needs assistance/impaired   Lower Body Dressing: Cueing for compensatory techniques;Sitting/lateral leans;Set up;Cueing for safety;Minimal assistance Lower Body Dressing Details (indicate cue type and reason): pt utilized AE (reacher, sock aid) to complete LB dressing, OT placing sock onto sock aid initially for demonstration, able to don/doff socks while sitting EOB with Min A (socks getting stuck on compression stockings), Min VC to maintain posterior hip precautions  Toilet Transfer: Min guard;Rolling walker (2 wheels) Toilet Transfer Details (indicate cue type and reason): simulated with STS from EOB  Functional mobility during ADLs: Min guard;Rolling walker (2 wheels) (to  take several steps at EOB, pt reported feeling "unsteady" and deferred further mobility to the [facility]) General ADL Comments: Anticipate increased assistance required for standing ADL tasks (peri care, clothing management, hiking LB garments over hips).     Vision Baseline Vision/History: 1 Wears glasses (readers only) Patient Visual Report: No change from baseline       Perception     Praxis      Pertinent Vitals/Pain Pain Assessment Pain Assessment: 0-10 Pain Score: 3  Pain Location: Left hip, near incision Pain Descriptors / Indicators: Aching Pain Intervention(s): Limited activity within patient's tolerance, Monitored during session, Premedicated before session, Repositioned     Hand Dominance     Extremity/Trunk Assessment Upper Extremity Assessment Upper Extremity Assessment: Generalized weakness   Lower Extremity Assessment Lower Extremity Assessment: Generalized weakness;LLE deficits/detail LLE Deficits / Details: s/p THA       Communication Communication Communication: No difficulties   Cognition Arousal/Alertness: Awake/alert Behavior During Therapy: WFL for tasks assessed/performed Overall Cognitive Status: Within Functional Limits for tasks assessed       General Comments  Pt endorsed dizziness/vertigo once sitting EOB. Pt gave meds during session & pt reported improvement in symptoms.    Exercises Other Exercises Other Exercises: OT provided education re: role of OT, OT POC, post acute recs, sitting up for all meals, EOB/OOB mobility with assistance, home/fall safety, LLE WBAT, posterior hip precautions, LB dressing AE (reacher, sock aid)     Shoulder Instructions      Home Living Family/patient expects to be discharged to:: Skilled nursing facility Living Arrangements: Alone   Type of Home: Independent living facility [facility]) Home Access: Level entry     Home Layout: One level               Home Equipment: Rollator (4  wheels);Adaptive equipment (pt reports having RW and BSC from spouse in storage) Adaptive Equipment: Reacher        Prior Functioning/Environment Prior Level of Function : Independent/Modified Independent;Driving             Mobility Comments: Pt reports Mod I using rollator, denies history of falls, still drives ADLs Comments: Pt reports Mod I for ADLs/IADLs        OT Problem List: Decreased strength;Decreased range of motion;Decreased activity tolerance;Impaired balance (sitting and/or standing);Pain;Decreased knowledge of use of DME or AE;Decreased knowledge of precautions      OT Treatment/Interventions: Self-care/ADL training;Therapeutic exercise;Energy conservation;DME and/or AE instruction;Therapeutic activities;Patient/family education;Balance training    OT Goals(Current goals can be found in the care plan section) Acute Rehab OT Goals Patient Stated Goal: get stronger, return to PLOF OT Goal Formulation: With patient Time For Goal Achievement: 08/01/22 Potential to Achieve Goals: Good   OT Frequency: Min 1X/week    Co-evaluation              AM-PAC OT "6 Clicks" Daily Activity     Outcome Measure Help from another person eating meals?: None Help from another person taking care of personal grooming?: A Little Help from another person toileting, which includes using toliet, bedpan, or urinal?: A Lot Help from another person bathing (including washing, rinsing, drying)?: A Lot Help from another person to put on and taking off regular upper body clothing?: None Help from another person to put on and taking off regular lower body clothing?: A Lot 6 Click Score: 17   End of Session Equipment Utilized During Treatment: Gait  belt;Rolling walker (2 wheels) Nurse Communication: Mobility status;Precautions;Weight bearing status  Activity Tolerance: Patient tolerated treatment well Patient left: Other (comment) (pt left sitting EOB with PT present)  OT Visit  Diagnosis: Unsteadiness on feet (R26.81);Muscle weakness (generalized) (M62.81);Pain;Other abnormalities of gait and mobility (R26.89) Pain - Right/Left: Left Pain - part of body: Hip                Time: 9562-1308 OT Time Calculation (min): 24 min Charges:  OT General Charges $OT Visit: 1 Visit OT Evaluation $OT Eval Low Complexity: 1 Low  [facility] MS, OTR/L ascom [phone]  07/18/22, 11:10 AM

## 2022-07-18 NOTE — Progress Notes (Addendum)
Subjective: 1 Day Post-Op Procedure(s) (LRB): TOTAL HIP ARTHROPLASTY (Left) Patient reports pain as 0 on 0-10 scale. Patient states that she was able to work with physical therapy yesterday however was feeling a little nauseous and dizzy, states like she has had vertigo in the past.  Also states that she was doing better with just taking the 50 of tramadol versus taking any other pain medications. Patient seen in rounds with Dr. Ernest Pine. Patient is well, and has had no acute complaints or problems We will start therapy today.  Plan is to go  [facility] [place]   after hospital stay, pending insurance approval.  Objective: Vital signs in last 24 hours: Temp:  [97 F (36.1 C)-98.3 F (36.8 C)] 97.6 F (36.4 C) (05/21 0800) Pulse Rate:  [58-78] 58 (05/21 0800) Resp:  [10-20] 18 (05/21 0800) BP: (96-137)/(42-70) 103/48 (05/21 0800) SpO2:  [96 %-100 %] 96 % (05/21 0800)  Intake/Output from previous day:  Intake/Output Summary (Last 24 hours) at 07/18/2022 0821 Last data filed at 07/18/2022 0600 Gross per 24 hour  Intake 3079.8 ml  Output 995 ml  Net 2084.8 ml    Intake/Output this shift: No intake/output data recorded.  Labs: No results for input(s): "HGB" in the last 72 hours. No results for input(s): "WBC", "RBC", "HCT", "PLT" in the last 72 hours. No results for input(s): "NA", "K", "CL", "CO2", "BUN", "CREATININE", "GLUCOSE", "CALCIUM" in the last 72 hours. No results for input(s): "LABPT", "INR" in the last 72 hours.  EXAM General - Patient is Alert, Appropriate, and Oriented Extremity - Neurologically intact ABD soft Neurovascular intact Sensation intact distally Intact pulses distally Dorsiflexion/Plantar flexion intact No cellulitis present Compartment soft Dressing - dressing C/D/I and no drainage Motor Function - intact, moving foot and toes well on exam.  Patient able to dorsi and plantarflex with good range of motion and strength.  Patient is also able to  straight leg raise without any difficulty on her left side. JP drain pulled without difficulty. Intact  Past Medical History:  Diagnosis Date   Anxiety    Clavicular fracture    Colon polyps    Depression    History of chicken pox    History of measles    History of mumps    Hyperlipidemia    Hypertension    Hypoglycemia    Hypothyroidism    Osteoporosis    PONV (postoperative nausea and vomiting)     Assessment/Plan: 1 Day Post-Op Procedure(s) (LRB): TOTAL HIP ARTHROPLASTY (Left) Principal Problem:   Hx of total hip arthroplasty, left  Estimated body mass index is 23.02 kg/m as calculated from the following:   Height as of this encounter: 5' 3.5" (1.613 m).   Weight as of this encounter: 59.9 kg. Advance diet Up with therapy -continue to work with physical therapy today, will need to pass PT protocols before being discharged.  Plan is to discharge to the [facility] [place] for rehabilitation.  Pending insurance approval, patient will be discharged to the [facility] [place].  Patient will be sent to her facility with prescriptions for pain medications including oxycodone and tramadol to help with as needed pain.  Patient will also be sent with prescriptions for Lovenox as DVT prophylaxis.  Patient states that she has not tolerated taking Celebrex in the past, so Celebrex has not been ordered.  She states that her nausea and dizziness has greatly improved since yesterday, states that it was may be a side effect of the anesthesia.  But  is feeling much better today.  DVT Prophylaxis - Lovenox Weight Bearing As Tolerated left leg JP drain pulled, intact Continue with physical therapy today Hip Preacutions discussed  Rayburn Go, PA-C [facility] 07/18/2022, 8:21 AM

## 2022-07-18 NOTE — Progress Notes (Addendum)
Physical Therapy Treatment Patient Details Name: Laura Love MRN: [id number] DOB: 26-Sep-1940 Today's Date: 07/18/2022   History of Present Illness Laura Love is an 81yoF who comes [facility] on [facility]22 for elective THA with Dr. Ernest Pine. Pt lives at the [address] of [place] ([place]).    PT Comments    Pt in bed, post lunch, grandson visiting, agreeable to PT session, needs to AMB to BR for voiding to start. Pt continues to demonstrate nominal knowledge of post hip precautions, but is unable to demonstrate them in mobility tasks without verbal/tactile cues each time. Pt shows ability to perform STS transfers from EOB, from high toilet, from recliner. Pt able to advance sustained AMB in hall today and demonstrate 4 stairs up, 4 stairs down. Pt is able to perform static standing balance at sink while she performs the bimanual task of teeth brushing (including screw cap off/on, brush pasting). Pt is drowsy from antivert but reports it is very effective in restraining her symptoms. Asked her to study her precautions with handout and attempt any HEP she is able to do prior to dinner. Will continue to follow.    Recommendations for follow up therapy are one component of a multi-disciplinary discharge planning process, led by the attending physician.  Recommendations may be updated based on patient status, additional functional criteria and insurance authorization.  Follow Up Recommendations       Assistance Recommended at Discharge Set up Supervision/Assistance  Patient can return home with the following A lot of help with walking and/or transfers;Assistance with cooking/housework   Equipment Recommendations  Rolling walker (2 wheels);BSC/3in1    Recommendations for Other Services       Precautions / Restrictions Precautions Precautions: Posterior Hip;Fall Precaution Booklet Issued: Yes (comment) Restrictions Weight Bearing Restrictions: Yes LLE Weight Bearing: Weight bearing as tolerated      Mobility  Bed Mobility Overal bed mobility: Needs Assistance Bed Mobility: Supine to Sit, Sit to Supine     Supine to sit: Supervision, HOB elevated Sit to supine: Supervision, HOB elevated   General bed mobility comments: already at EOB on arrival    Transfers Overall transfer level: Needs assistance Equipment used: Rolling walker (2 wheels) Transfers: Sit to/from Stand Sit to Stand: Supervision           General transfer comment: VC for hand placement multiple times, not sticking    Ambulation/Gait Ambulation/Gait assistance: Min guard Gait Distance (Feet): 90 Feet (80ft to BR, then 67ft to double doors and back to recliner.) Assistive device: Rolling walker (2 wheels) Gait Pattern/deviations: Step-to pattern Gait velocity: 0.14m/s     General Gait Details: 3-point gait pattern, very slow, stable symptoms, no LOB, avoided turns to maximize tolerated time in upright. (still some dizziness while up but only minimal (on antivert at present))   Stairs Stairs: Yes Stairs assistance: Min guard Stair Management: Two rails, Forwards Number of Stairs: 4 General stair comments: cues for sequencing, precautions demonstration while turning atop stairs   Wheelchair Mobility    Modified Rankin (Stroke Patients Only)       Balance                                            Cognition Arousal/Alertness: Awake/alert, Suspect due to medications Behavior During Therapy: WFL for tasks assessed/performed Overall Cognitive Status: Within Functional Limits for tasks assessed  Exercises Total Joint Exercises Gluteal Sets: AROM, Both, 10 reps, Supine Towel Squeeze: AROM, Both, 10 reps, Supine Short Arc Quad: AROM, Supine, 5 reps, Left Heel Slides: AAROM, Left, 10 reps, Supine Hip ABduction/ADduction: AAROM, Left, 10 reps, Supine Long Arc Quad: AROM, Left, 10 reps, Seated    General Comments  General comments (skin integrity, edema, etc.): Pt endorsed dizziness/vertigo once sitting EOB. Pt gave meds during session & pt reported improvement in symptoms.      Pertinent Vitals/Pain Pain Assessment Pain Assessment:  (awaiting meds, but no pain unless moving her leg in swing phase) Pain Score: 3  Pain Location: Left hip, near incision Pain Intervention(s): Limited activity within patient's tolerance, Monitored during session, Premedicated before session    Home Living Family/patient expects to be discharged to:: Skilled nursing facility Living Arrangements: Alone   Type of Home: Independent living facility [facility]) Home Access: Level entry       Home Layout: One level Home Equipment: Rollator (4 wheels);Adaptive equipment (pt reports having RW and BSC from spouse in storage)      Prior Function            PT Goals (current goals can now be found in the care plan section) Acute Rehab PT Goals Patient Stated Goal: return to ILF for SNF level care PT Goal Formulation: With patient Time For Goal Achievement: 07/31/22 Potential to Achieve Goals: Good Progress towards PT goals: Progressing toward goals    Frequency    BID      PT Plan Current plan remains appropriate    Co-evaluation              AM-PAC PT "6 Clicks" Mobility   Outcome Measure  Help needed turning from your back to your side while in a flat bed without using bedrails?: None Help needed moving from lying on your back to sitting on the side of a flat bed without using bedrails?: None Help needed moving to and from a bed to a chair (including a wheelchair)?: A Little Help needed standing up from a chair using your arms (e.g., wheelchair or bedside chair)?: A Little Help needed to walk in hospital room?: A Little Help needed climbing 3-5 steps with a railing? : A Lot 6 Click Score: 19    End of Session Equipment Utilized During Treatment: Gait belt Activity Tolerance:  Patient tolerated treatment well;No increased pain Patient left: with call bell/phone within reach;in bed Nurse Communication: Mobility status PT Visit Diagnosis: Unsteadiness on feet (R26.81);Difficulty in walking, not elsewhere classified (R26.2);Other abnormalities of gait and mobility (R26.89);Repeated falls (R29.6);Muscle weakness (generalized) (M62.81);Other symptoms and signs involving the nervous system (R29.898)     Time: 1350-1426 PT Time Calculation (min) (ACUTE ONLY): 36 min  Charges:  $Gait Training: 23-37 mins                    2:43 PM, 07/18/22 Rosamaria Lints, PT, DPT Physical Therapist - [facility]  [phone] (ASCOM)    Linell Shawn C 07/18/2022, 2:39 PM

## 2022-07-18 NOTE — TOC Progression Note (Signed)
Transition of Care [facility]) - Progression Note    Patient Details  Name: Laura Love MRN: [id number] Date of Birth: 1940-12-22  Transition of Care [facility]) CM/SW Contact  Marlowe Sax, RN Phone Number: 07/18/2022, 12:15 PM  Clinical Narrative:    Ins auth pending to go to [employer] for [employer]        Expected Discharge Plan and Services                                               Social Determinants of Health (SDOH) Interventions SDOH Screenings   Food Insecurity: No Food Insecurity (07/17/2022)  Housing: Low Risk  (07/17/2022)  Transportation Needs: No Transportation Needs (07/17/2022)  Utilities: Not At Risk (07/17/2022)  Alcohol Screen: Low Risk  (05/12/2022)  Depression (PHQ2-9): Low Risk  (05/12/2022)  Financial Resource Strain: Low Risk  (04/20/2020)  Physical Activity: Inactive (04/20/2020)  Social Connections: Moderately Isolated (04/20/2020)  Stress: Stress Concern Present (04/20/2020)  Tobacco Use: Medium Risk (07/17/2022)    Readmission Risk Interventions     No data to display

## 2022-07-18 NOTE — Progress Notes (Signed)
Physical Therapy Treatment Patient Details Name: Laura Love MRN: [id number] DOB: [birth year] Today's Date: 07/18/2022   History of Present Illness Laura Love is an 81yoF who comes to [facility] on 07/17/22 for elective THA with Dr. Ernest Pine. Pt lives at the [place] of [place].    PT Comments    Pt at EOB finishing OT evaluation. Pain at goal. Pt remains hypervigilant regarding precautions, occasionally includes additional material unfamiliar to author, outside of typical hip precautions. Pt able to advance AMB to 38ft c RW, albeit gait speed would not allow for safe exit from a building in an emergency. Pt requires no physical assist for transfers, but does need extensive safety/technique cues. Pt able to tolerate first round of HEP education, but needs active assistance to complete long lever, full limb A/ROM in sagittal and frontal planes. Will continue to progress as able. Pt still needs stairs training and completion of HEP handout.;     Recommendations for follow up therapy are one component of a multi-disciplinary discharge planning process, led by the attending physician.  Recommendations may be updated based on patient status, additional functional criteria and insurance authorization.  Follow Up Recommendations       Assistance Recommended at Discharge Set up Supervision/Assistance  Patient can return home with the following A lot of help with walking and/or transfers;Assistance with cooking/housework   Equipment Recommendations  Rolling walker (2 wheels);BSC/3in1    Recommendations for Other Services       Precautions / Restrictions Precautions Precautions: Posterior Hip Precaution Booklet Issued: Yes (comment) Restrictions LLE Weight Bearing: Weight bearing as tolerated     Mobility  Bed Mobility               General bed mobility comments: already at EOB on arrival    Transfers Overall transfer level: Needs assistance Equipment used: Rolling walker (2  wheels) Transfers: Sit to/from Stand Sit to Stand: Supervision, From elevated surface           General transfer comment: cues for feet and hands, poor retention    Ambulation/Gait Ambulation/Gait assistance: Min guard Gait Distance (Feet): 60 Feet Assistive device: Rolling walker (2 wheels) Gait Pattern/deviations: Step-to pattern Gait velocity: 0.87m/s (4cm/s)     General Gait Details: 3-point gait pattern, very slow, stable symptoms, no LOB, avoided turns to maximize tolerated time in upright.   Stairs             Wheelchair Mobility    Modified Rankin (Stroke Patients Only)       Balance                                            Cognition Arousal/Alertness: Awake/alert Behavior During Therapy: WFL for tasks assessed/performed Overall Cognitive Status: Within Functional Limits for tasks assessed                                          Exercises Total Joint Exercises Gluteal Sets: AROM, Both, 10 reps, Supine Towel Squeeze: AROM, Both, 10 reps, Supine Short Arc Quad: AROM, Supine, 5 reps, Left Heel Slides: AAROM, Left, 10 reps, Supine Hip ABduction/ADduction: AAROM, Left, 10 reps, Supine Long Arc Quad: AROM, Left, 10 reps, Seated    General Comments        Pertinent Vitals/Pain Pain  Assessment Pain Assessment: 0-10 Pain Score: 3  Pain Location: Left hip, near incision Pain Intervention(s): Limited activity within patient's tolerance, Monitored during session, Premedicated before session, Repositioned    Home Living                          Prior Function            PT Goals (current goals can now be found in the care plan section) Acute Rehab PT Goals Patient Stated Goal: return to ILF for SNF level care PT Goal Formulation: With patient Time For Goal Achievement: 07/31/22 Potential to Achieve Goals: Good Progress towards PT goals: Progressing toward goals    Frequency    BID       PT Plan Current plan remains appropriate    Co-evaluation              AM-PAC PT "6 Clicks" Mobility   Outcome Measure  Help needed turning from your back to your side while in a flat bed without using bedrails?: None Help needed moving from lying on your back to sitting on the side of a flat bed without using bedrails?: None Help needed moving to and from a bed to a chair (including a wheelchair)?: A Little Help needed standing up from a chair using your arms (e.g., wheelchair or bedside chair)?: A Little Help needed to walk in hospital room?: A Little Help needed climbing 3-5 steps with a railing? : A Lot 6 Click Score: 19    End of Session Equipment Utilized During Treatment: Gait belt Activity Tolerance: Patient tolerated treatment well;No increased pain Patient left: in chair;with call bell/phone within reach Nurse Communication: Mobility status PT Visit Diagnosis: Unsteadiness on feet (R26.81);Difficulty in walking, not elsewhere classified (R26.2);Other abnormalities of gait and mobility (R26.89);Repeated falls (R29.6);Muscle weakness (generalized) (M62.81);Other symptoms and signs involving the nervous system (R29.898)     Time: 8295-6213 PT Time Calculation (min) (ACUTE ONLY): 34 min  Charges:  $Gait Training: 8-22 mins $Therapeutic Exercise: 8-22 mins           9:53 AM, 07/18/22 Rosamaria Lints, PT, DPT Physical Therapist - [facility]  [phone] (ASCOM)              Harout Scheurich C 07/18/2022, 9:50 AM

## 2022-07-18 NOTE — Progress Notes (Signed)
Patient is not able to walk the distance required to go the bathroom, or he/she is unable to safely negotiate stairs required to access the bathroom.  A 3in1 BSC will alleviate this problem   Lynzi Meulemans P. Journii Nierman, Jr. M.D.  

## 2022-07-19 DIAGNOSIS — M1612 Unilateral primary osteoarthritis, left hip: Secondary | ICD-10-CM | POA: Diagnosis not present

## 2022-07-19 DIAGNOSIS — Z79899 Other long term (current) drug therapy: Secondary | ICD-10-CM | POA: Diagnosis not present

## 2022-07-19 DIAGNOSIS — E039 Hypothyroidism, unspecified: Secondary | ICD-10-CM | POA: Diagnosis not present

## 2022-07-19 MED ORDER — METOCLOPRAMIDE HCL 10 MG PO TABS
ORAL_TABLET | ORAL | Status: AC
  Filled 2022-07-19: qty 1

## 2022-07-19 MED ORDER — EZETIMIBE 10 MG PO TABS
ORAL_TABLET | ORAL | Status: AC
  Filled 2022-07-19: qty 1

## 2022-07-19 MED ORDER — TRAMADOL HCL 50 MG PO TABS
ORAL_TABLET | ORAL | Status: AC
  Filled 2022-07-19: qty 1

## 2022-07-19 MED ORDER — ACETAMINOPHEN 325 MG PO TABS
ORAL_TABLET | ORAL | Status: AC
  Filled 2022-07-19: qty 2

## 2022-07-19 MED ORDER — CELECOXIB 200 MG PO CAPS
ORAL_CAPSULE | ORAL | Status: AC
  Filled 2022-07-19: qty 1

## 2022-07-19 MED ORDER — PANTOPRAZOLE SODIUM 40 MG PO TBEC
DELAYED_RELEASE_TABLET | ORAL | Status: AC
  Filled 2022-07-19: qty 1

## 2022-07-19 MED ORDER — SENNOSIDES-DOCUSATE SODIUM 8.6-50 MG PO TABS
ORAL_TABLET | ORAL | Status: AC
  Filled 2022-07-19: qty 1

## 2022-07-19 MED ORDER — OXYCODONE HCL 5 MG PO TABS: 5.0000 mg | ORAL_TABLET | ORAL | 0 refills | Status: DC | PRN

## 2022-07-19 MED ORDER — TRAMADOL HCL 50 MG PO TABS: 50.0000 mg | ORAL_TABLET | ORAL | 0 refills | Status: AC | PRN

## 2022-07-19 MED ORDER — ENOXAPARIN SODIUM 40 MG/0.4ML IJ SOSY: 40.0000 mg | PREFILLED_SYRINGE | INTRAMUSCULAR | 0 refills | Status: DC

## 2022-07-19 MED ORDER — ENOXAPARIN SODIUM 40 MG/0.4ML IJ SOSY
PREFILLED_SYRINGE | INTRAMUSCULAR | Status: AC
  Filled 2022-07-19: qty 0.4

## 2022-07-19 MED ORDER — LACTULOSE 10 GM/15ML PO SOLN
20.0000 g | Freq: Every day | ORAL | Status: AC | PRN
  Administered 2022-07-19: 20 g via ORAL
  Filled 2022-07-19: qty 30

## 2022-07-19 MED ORDER — MAGNESIUM HYDROXIDE 400 MG/5ML PO SUSP
ORAL | Status: AC
  Filled 2022-07-19: qty 30

## 2022-07-19 MED ORDER — BUPROPION HCL ER (SR) 150 MG PO TB12
150.0000 mg | ORAL_TABLET | Freq: Every day | ORAL | Status: DC
  Administered 2022-07-19 – 2022-07-20 (×2): 150 mg via ORAL
  Filled 2022-07-19 (×2): qty 1

## 2022-07-19 NOTE — Progress Notes (Signed)
Pt with low BP (see flowsheet), PA Josh notified. Negative orthostatics. Pt asymptomatic. Josh at bedside.  Discharge canceled for today.  Plan for discharge 5/23.

## 2022-07-19 NOTE — Progress Notes (Signed)
PT Cancellation Note  Patient Details Name: Laura Love MRN: [id number] DOB: 10/24/1940   Cancelled Treatment:     PT attempt, PT hold. Pt endorses feeling extremely poor upon arrival. She did not endorse dizziness however first BP reading 89/38 (54). Second reading ~ 2 minutes later 101/58. RN messaged MD. Thereasa Parkin will return shortly and PT will continue to follow per current POC.    Rushie Chestnut 07/19/2022, 9:03 AM

## 2022-07-19 NOTE — Progress Notes (Signed)
Subjective: 2 Days Post-Op Procedure(s) (LRB): TOTAL HIP ARTHROPLASTY (Left) Patient reports pain as 3/10 this morning - is a little more sore on her L hip from yesterday. Patient states that she was able to work with physical therapy yesterday and was doing very well, was able to ambulate and do 4 stairs. Also states that she was doing well with pain control with just taking the 50 of tramadol versus taking any other pain medications. Patient seen in rounds with Dr. Ernest Pine. Patient is just concerned about her bowel regiment, states she normally takes more Sennakot at home - one time lactulose ordered We continue with therapy today.  [place]  [facility] [place] stay, pending insurance approval.  Objective: Vital signs in last 24 hours: Temp:  [97.4 F (36.3 C)-98.1 F (36.7 C)] 97.4 F (36.3 C) (05/22 0502) Pulse Rate:  [58-68] 68 (05/22 0502) Resp:  [15-18] 15 (05/22 0502) BP: (103-120)/(46-56) 108/49 (05/22 0502) SpO2:  [95 %-97 %] 96 % (05/22 0502)  Intake/Output from previous day:  Intake/Output Summary (Last 24 hours) at 07/19/2022 0709 Last data filed at 07/19/2022 0300 Gross per 24 hour  Intake 0 ml  Output --  Net 0 ml    Intake/Output this shift: No intake/output data recorded.  Labs: No results for input(s): "HGB" in the last 72 hours. No results for input(s): "WBC", "RBC", "HCT", "PLT" in the last 72 hours. No results for input(s): "NA", "K", "CL", "CO2", "BUN", "CREATININE", "GLUCOSE", "CALCIUM" in the last 72 hours. No results for input(s): "LABPT", "INR" in the last 72 hours.  EXAM General - Patient is Alert, Appropriate, and Oriented Extremity - Neurologically intact ABD soft Neurovascular intact Sensation intact distally Intact pulses distally Dorsiflexion/Plantar flexion intact No cellulitis present Compartment soft Dressing - dressing C/D/I and no drainage Motor Function - intact, moving foot and toes well on exam.  Patient  able to dorsi and plantarflex with good range of motion and strength.  Patient is also able to straight leg raise without any difficulty on her left side. JP drain site is clean and dry without drainage appreciated  Neurovascularly intact to all lower leg dermatomes and posterior tibial pulses appreciated (2+)  Past Medical History:  Diagnosis Date   Anxiety    Clavicular fracture    Colon polyps    Depression    History of chicken pox    History of measles    History of mumps    Hyperlipidemia    Hypertension    Hypoglycemia    Hypothyroidism    Osteoporosis    PONV (postoperative nausea and vomiting)     Assessment/Plan: 2 Days Post-Op Procedure(s) (LRB): TOTAL HIP ARTHROPLASTY (Left) Principal Problem:   Hx of total hip arthroplasty, left  Estimated body mass index is 23.02 kg/m as calculated from the following:   Height as of this encounter: 5' 3.5" (1.613 m).   Weight as of this encounter: 59.9 kg. Advance diet Up with therapy -continue to work with physical therapy today, has already passed PT protocols for being discharged.  Plan is to discharge to the [facility] [place] for rehabilitation.  Pending insurance approval, patient will be discharged to the [facility] [place].  Patient will be sent to her facility with prescriptions for pain medications including oxycodone and tramadol to help with as needed pain.  Patient will also be sent with prescriptions for Lovenox as DVT prophylaxis.  Patient states that she has not tolerated taking Celebrex in  the past, so Celebrex has not been ordered.  She states that her nausea and dizziness has greatly improved.  Discussed with the patient that she will keep the Aquacel bandage on for 5 more days before having it switched out for a honeycomb bandage that she will be sent home with.  PT to work with her at her facility and help change his bandages out.  Staples will also be removed by physical therapy at the 2-week  postoperative mark.  Patient urged to continue to utilize ice to help with swelling and inflammation  Patient will follow-up with [facility] clinic orthopedics for a 6-week evaluation with Dr. Ernest Pine.  Patient can follow-up with [facility] clinic orthopedics if has any concerns before the 6-week mark.  DVT Prophylaxis - Lovenox Weight Bearing As Tolerated left leg JP drain site is clean, no drainage appreciated Continue with physical therapy today, and at rehab facility Hip Preacutions discussed  Rayburn Go, PA-C [facility] 07/19/2022, 7:09 AM

## 2022-07-19 NOTE — TOC Progression Note (Signed)
Transition of Care [facility]) - Progression Note    Patient Details  Name: Laura Love MRN: [id number] Date of Birth: 04-Jan-1941  Transition of Care [facility]) CM/SW Contact  Marlowe Sax, RN Phone Number: 07/19/2022, 2:30 PM  Clinical Narrative:   Ins auth apporved next review date 6/3, [facility] will transport tomorrow, nurse to call report to [phone] going to room 355         Expected Discharge Plan and Services         Expected Discharge Date: 07/19/22                                     Social Determinants of Health (SDOH) Interventions SDOH Screenings   Food Insecurity: No Food Insecurity (07/17/2022)  Housing: Low Risk  (07/17/2022)  Transportation Needs: No Transportation Needs (07/17/2022)  Utilities: Not At Risk (07/17/2022)  Alcohol Screen: Low Risk  (05/12/2022)  Depression (PHQ2-9): Low Risk  (05/12/2022)  Financial Resource Strain: Low Risk  (04/20/2020)  Physical Activity: Inactive (04/20/2020)  Social Connections: Moderately Isolated (04/20/2020)  Stress: Stress Concern Present (04/20/2020)  Tobacco Use: Medium Risk (07/18/2022)    Readmission Risk Interventions     No data to display

## 2022-07-19 NOTE — Progress Notes (Signed)
Physical Therapy Treatment Patient Details Name: Laura Love MRN: [id number] DOB: March 09, 1940 Today's Date: 07/19/2022   History of Present Illness Tyrene Bowes is an 81yoF who comes to [facility] on 07/17/22 for elective THA with Dr. Ernest Pine. Pt lives at the [address] of [place] ([place]).    PT Comments    Pt was supine in bed with HOB elevated ~ 20 degrees. She is Alert (much more awake than earlier session) and agreeable to PT. Was able to exit R side of bed with increased time. BP 101/57. No symptoms of dizziness. Session progressed to standing and ambulating ~ 200 ft with RW. Vcs for improved heel strike to toe off gait sequencing. Once pt returned to room, she was able to tolerate performing HEP handout with instructions for improved technique. Overall pt is progressing well. Will continue current POC progressing as able per pt tolerance. Pt planning to DC to [facility] tomorrow when transport available.    Recommendations for follow up therapy are one component of a multi-disciplinary discharge planning process, led by the attending physician.  Recommendations may be updated based on patient status, additional functional criteria and insurance authorization.     Assistance Recommended at Discharge Set up Supervision/Assistance  Patient can return home with the following A little help with walking and/or transfers;A little help with bathing/dressing/bathroom;Assistance with cooking/housework;Assistance with feeding;Direct supervision/assist for medications management;Direct supervision/assist for financial management;Assist for transportation;Help with stairs or ramp for entrance   Equipment Recommendations  Rolling walker (2 wheels);BSC/3in1       Precautions / Restrictions Precautions Precautions: Posterior Hip;Fall Precaution Booklet Issued: Yes (comment) Restrictions Weight Bearing Restrictions: Yes LLE Weight Bearing: Weight bearing as tolerated     Mobility  Bed  Mobility Overal bed mobility: Needs Assistance Bed Mobility: Supine to Sit, Sit to Supine Rolling: Supervision Sidelying to sit: Supervision Supine to sit: Supervision Sit to supine: Supervision General bed mobility comments: Increased time required however pt was able to perform without physical assistance    Transfers Overall transfer level: Needs assistance Equipment used: Rolling walker (2 wheels) Transfers: Sit to/from Stand Sit to Stand: Supervision  General transfer comment: Pt was able to stand form EOB, BSC, and recliner with supervision only    Ambulation/Gait Ambulation/Gait assistance: Min guard Gait Distance (Feet): 200 Feet Assistive device: Rolling walker (2 wheels) Gait Pattern/deviations: Step-through pattern, Antalgic Gait velocity: decreased  General Gait Details: Pt was able to ambulate ~ 200 ft with RW with CGA for safety. does continue to need vcs for heel strike and try to turn towards R versus L   Stairs    General stair comments: pt states confidence in stair performance from previous date   Balance Overall balance assessment: Needs assistance Sitting-balance support: Feet supported Sitting balance-Leahy Scale: Good     Standing balance support: Bilateral upper extremity supported, During functional activity, Reliant on assistive device for balance Standing balance-Leahy Scale: Fair    Cognition Arousal/Alertness: Awake/alert Behavior During Therapy: WFL for tasks assessed/performed Overall Cognitive Status: Within Functional Limits for tasks assessed    General Comments: Pt is A and O x4. Agreeable to session. Does require education for remembering hip precautions        Exercises Total Joint Exercises Ankle Circles/Pumps: AROM, 10 reps Quad Sets: AROM, 10 reps Gluteal Sets: 10 reps Short Arc Quad: AROM, 10 reps Heel Slides: AROM, 10 reps, Supine Hip ABduction/ADduction: AROM, 10 reps        Pertinent Vitals/Pain Pain  Assessment Pain Assessment: 0-10 Pain  Score: 2  Pain Location: Left hip, near incision Pain Descriptors / Indicators: Aching Pain Intervention(s): Limited activity within patient's tolerance, Monitored during session, Premedicated before session (pt only taking tylonol)     PT Goals (current goals can now be found in the care plan section) Acute Rehab PT Goals Patient Stated Goal: less pain and more energy Progress towards PT goals: Progressing toward goals    Frequency    BID      PT Plan Current plan remains appropriate    AM-PAC PT "6 Clicks" Mobility   Outcome Measure  Help needed turning from your back to your side while in a flat bed without using bedrails?: None Help needed moving from lying on your back to sitting on the side of a flat bed without using bedrails?: None Help needed moving to and from a bed to a chair (including a wheelchair)?: A Little Help needed standing up from a chair using your arms (e.g., wheelchair or bedside chair)?: A Little Help needed to walk in hospital room?: A Little Help needed climbing 3-5 steps with a railing? : A Little 6 Click Score: 20    End of Session Equipment Utilized During Treatment: Gait belt Activity Tolerance: Patient tolerated treatment well;Patient limited by fatigue;Patient limited by lethargy Patient left: in bed;with call bell/phone within reach;with bed alarm set;with nursing/sitter in room Nurse Communication: Mobility status PT Visit Diagnosis: Unsteadiness on feet (R26.81);Difficulty in walking, not elsewhere classified (R26.2);Other abnormalities of gait and mobility (R26.89);Repeated falls (R29.6);Muscle weakness (generalized) (M62.81);Other symptoms and signs involving the nervous system (R29.898)     Time: 1610-9604 PT Time Calculation (min) (ACUTE ONLY): 27 min  Charges:  $Gait Training: 8-22 mins $Therapeutic Exercise: 8-22 mins                    Jetta Lout PTA 07/19/22, 4:01 PM

## 2022-07-19 NOTE — Progress Notes (Signed)
Patient was seen at bedside with nursing staff earlier this afternoon.  Her blood pressure was low, her amlodipine was held this morning.  Patient did have narcotic pain medications last night and before nursing turnover this morning. Evaluation with orthostatic vitals showed that her blood pressure had stabilized, it was asymptomatic with sitting and standing.  Her insurance had been cleared at around 14:15 to be sent to her rehab facility, however, was informed that they stop transportation to this facility at 14:30 and do not except transport via EMS.  Unable to get patient discharged today due to issues with transportation, plan for discharge on 07/20/2022.

## 2022-07-19 NOTE — NC FL2 (Signed)
[place] MEDICAID FL2 LEVEL OF CARE FORM     IDENTIFICATION  Patient Name: Laura Love Birthdate: [birth year] Sex: female Admission Date (Current Location): 07/17/2022  [facility] and [place] Number:  Chiropodist and Address:  [facility], [address], [place], [place] [address]      Provider Number: [id number]  Attending Physician Name and Address:  Donato Heinz, MD  Relative Name and Phone Number:       Current Level of Care: Hospital Recommended Level of Care: Skilled Nursing Facility Prior Approval Number:    Date Approved/Denied:   PASRR Number:    Discharge Plan: SNF    Current Diagnoses: Patient Active Problem List   Diagnosis Date Noted   Hx of total hip arthroplasty, left 07/17/2022   Primary hypertension 06/20/2022   Labile blood pressure 06/20/2022   Chronic bronchitis (HCC) 08/11/2021   Oral phase dysphagia 08/11/2021   Joint pain 09/19/2019   Tick bite 08/14/2019   Pain in right knee 11/08/2017   History of measles, mumps, or rubella 04/10/2017   Allergic rhinitis 04/10/2017   Osteoporosis 04/10/2017   History of adenomatous polyp of colon 12/21/2014   Hypothyroidism 11/08/2010   Hyperlipemia 10/19/2010   Bladder incontinence 10/19/2010   Vaginal prolapse 10/19/2010   Cervical pain 09/22/2009   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   CN (constipation) 07/02/2008   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   Cannot sleep 03/10/2007   Dysthymia 03/10/2007   Family history of breast cancer 03/10/2007    Orientation RESPIRATION BLADDER Height & Weight     Self, Time, Situation, Place  Normal Continent Weight: 59.9 kg Height:  5' 3.5" (161.3 cm)  BEHAVIORAL SYMPTOMS/MOOD NEUROLOGICAL BOWEL NUTRITION STATUS      Continent Diet (see dc summary)  AMBULATORY STATUS COMMUNICATION OF NEEDS Skin   Limited Assist Verbally Normal, Surgical wounds                        Personal Care Assistance Level of Assistance  Bathing, Feeding, Dressing Bathing Assistance: Limited assistance Feeding assistance: Independent Dressing Assistance: Limited assistance     Functional Limitations Info  Sight, Hearing, Speech Sight Info: Adequate Hearing Info: Adequate Speech Info: Adequate    SPECIAL CARE FACTORS FREQUENCY  PT (By licensed PT), OT (By licensed OT)     PT Frequency: 5 imes per week OT Frequency: 5 times per week            Contractures Contractures Info: Not present    Additional Factors Info  Code Status, Allergies Code Status Info: Full code Allergies Info: Alendronate, Crestor (Rosuvastatin Calcium), Cymbalta (Duloxetine Hcl), Duloxetine, Metoprolol, Pentazocine           Current Medications (07/19/2022):  This is the current hospital active medication list Current Facility-Administered Medications  Medication Dose Route Frequency Provider Last Rate Last Admin   0.9 %  sodium chloride infusion   Intravenous Continuous Hooten, Illene Labrador, MD   Stopped at 07/17/22 2205   acetaminophen (TYLENOL) tablet 325-650 mg  325-650 mg Oral Q6H PRN Donato Heinz, MD   650 mg at 07/19/22 1136   albuterol (PROVENTIL) (2.5 MG/3ML) 0.083% nebulizer solution 3 mL  3 mL Inhalation Q6H PRN Hooten, Illene Labrador, MD       alum & mag hydroxide-simeth (MAALOX/MYLANTA) 200-200-20 MG/5ML suspension 30 mL  30 mL Oral Q4H PRN Hooten, Illene Labrador, MD  amLODipine (NORVASC) tablet 2.5 mg  2.5 mg Oral Daily Hooten, Illene Labrador, MD       bisacodyl (DULCOLAX) suppository 10 mg  10 mg Rectal Daily PRN Hooten, Illene Labrador, MD       buPROPion [facility] SR) 12 hr tablet 150 mg  150 mg Oral Daily Hooten, Illene Labrador, MD   150 mg at 07/19/22 8295   celecoxib (CELEBREX) capsule 200 mg  200 mg Oral BID Donato Heinz, MD   200 mg at 07/19/22 6213   diphenhydrAMINE (BENADRYL) 12.5 MG/5ML elixir 12.5-25 mg  12.5-25 mg Oral Q4H PRN Hooten, Illene Labrador, MD       enoxaparin (LOVENOX) injection  40 mg  40 mg Subcutaneous Q24H Hooten, Illene Labrador, MD   40 mg at 07/19/22 0825   ezetimibe (ZETIA) tablet 10 mg  10 mg Oral Daily Donato Heinz, MD   10 mg at 07/18/22 2112   HYDROmorphone (DILAUDID) injection 0.5-1 mg  0.5-1 mg Intravenous Q4H PRN Hooten, Illene Labrador, MD       hyoscyamine (ANASPAZ) disintergrating tablet 0.125 mg  0.125 mg Sublingual Q4H PRN Hooten, Illene Labrador, MD       lactulose (CHRONULAC) 10 GM/15ML solution 20 g  20 g Oral Daily PRN Rayburn Go, PA-C       levothyroxine (SYNTHROID) tablet 100 mcg  100 mcg Oral Q0600 Donato Heinz, MD   100 mcg at 07/19/22 0865   magnesium hydroxide (MILK OF MAGNESIA) suspension 30 mL  30 mL Oral Daily Hooten, Illene Labrador, MD   30 mL at 07/19/22 1138   meclizine (ANTIVERT) tablet 12.5 mg  12.5 mg Oral TID PRN Donato Heinz, MD   12.5 mg at 07/18/22 0901   menthol-cetylpyridinium (CEPACOL) lozenge 3 mg  1 lozenge Oral PRN Donato Heinz, MD   3 mg at 07/17/22 2021   Or   phenol (CHLORASEPTIC) mouth spray 1 spray  1 spray Mouth/Throat PRN Hooten, Illene Labrador, MD       metoCLOPramide (REGLAN) tablet 10 mg  10 mg Oral TID AC & HS Hooten, Illene Labrador, MD   10 mg at 07/19/22 1252   ondansetron (ZOFRAN) tablet 4 mg  4 mg Oral Q6H PRN Hooten, Illene Labrador, MD       Or   ondansetron (ZOFRAN) injection 4 mg  4 mg Intravenous Q6H PRN Hooten, Illene Labrador, MD   4 mg at 07/17/22 1457   Oral care mouth rinse  15 mL Mouth Rinse PRN Reinaldo Berber, MD       oxyCODONE (Oxy IR/ROXICODONE) immediate release tablet 10 mg  10 mg Oral Q4H PRN Hooten, Illene Labrador, MD       oxyCODONE (Oxy IR/ROXICODONE) immediate release tablet 5 mg  5 mg Oral Q4H PRN Hooten, Illene Labrador, MD   5 mg at 07/18/22 2300   pantoprazole (PROTONIX) EC tablet 40 mg  40 mg Oral BID Donato Heinz, MD   40 mg at 07/19/22 7846   polyvinyl alcohol (LIQUIFILM TEARS) 1.4 % ophthalmic solution 1-2 drop  1-2 drop Both Eyes QID PRN Hooten, Illene Labrador, MD       senna-docusate (Senokot-S) tablet 1 tablet  1 tablet Oral BID  Donato Heinz, MD   1 tablet at 07/19/22 0918   sodium chloride (OCEAN) 0.65 % nasal spray 1 spray  1 spray Each Nare PRN Hooten, Illene Labrador, MD       sodium phosphate (FLEET) 7-19 GM/118ML enema 1 enema  1 enema  Rectal Once PRN Hooten, Illene Labrador, MD       traMADol Janean Sark) tablet 50-100 mg  50-100 mg Oral Q4H PRN Donato Heinz, MD   50 mg at 07/19/22 0510     Discharge Medications: Please see discharge summary for a list of discharge medications.  Relevant Imaging Results:  Relevant Lab Results:   Additional Information    Marlowe Sax, RN

## 2022-07-19 NOTE — Progress Notes (Signed)
Pts BP 102/43 at rest, prior to therapy 89/34, and recheck 100/55. Pt asymptomatic.   PA Josh notified.

## 2022-07-19 NOTE — Progress Notes (Signed)
Physical Therapy Treatment Patient Details Name: Laura Love MRN: [id number] DOB: 06-19-1940 Today's Date: 07/19/2022   History of Present Illness Laura Love is an 81yoF who comes to [facility] on 07/17/22 for elective THA with Dr. Ernest Pine. Pt lives at the [address] of [place] ([place]).    PT Comments    Pt was asleep upon entry but easily awakes. She is A and O x 4. BP in supine 116/47 (67). Upon sitting up EOB, BP 109/55(70). Overall pt endorses feeling better than she did earlier this date. Author elected to progress session forward. She continues to require assistance and Vcs for safe techniques for mobility, transfers, and gait while adhering to posterior hip precautions. Laura Love tolerated getting OOB to [facility] to urinate, prior to standing and progressing to ambulation in hallway. Overall pt is limited by pain/fatigue. Was able to ambulate ~ 70 ft total. Pt will continue to benefit from skilled PT at DC to maximize independence and safety with all ADLs. Plan to DC to rehab later this date. Author will return for PM session if pt is not already Dc'd.     Recommendations for follow up therapy are one component of a multi-disciplinary discharge planning process, led by the attending physician.  Recommendations may be updated based on patient status, additional functional criteria and insurance authorization.     Assistance Recommended at Discharge Set up Supervision/Assistance  Patient can return home with the following A little help with walking and/or transfers;A little help with bathing/dressing/bathroom;Assistance with cooking/housework;Assistance with feeding;Direct supervision/assist for medications management;Direct supervision/assist for financial management;Assist for transportation;Help with stairs or ramp for entrance   Equipment Recommendations  Rolling walker (2 wheels);BSC/3in1       Precautions / Restrictions Precautions Precautions: Posterior Hip;Fall Precaution Booklet Issued:  Yes (comment) Restrictions Weight Bearing Restrictions: Yes LLE Weight Bearing: Weight bearing as tolerated     Mobility  Bed Mobility Overal bed mobility: Needs Assistance Bed Mobility: Supine to Sit, Sidelying to Sit, Rolling Rolling: Supervision Sidelying to sit: Supervision Supine to sit: Supervision (HOB flat)  General bed mobility comments: Pt was asleep in supine upon arrival. She was able to roll R to short sit with increased time and vcs for maintaining all precautions.    Transfers Overall transfer level: Needs assistance Equipment used: Rolling walker (2 wheels) Transfers: Sit to/from Stand Sit to Stand: Supervision  General transfer comment: Pt was able to stand form EOB, BSC, and recliner with supervision only    Ambulation/Gait Ambulation/Gait assistance: Min guard Gait Distance (Feet): 65 Feet Assistive device: Rolling walker (2 wheels) Gait Pattern/deviations: Step-to pattern, Antalgic Gait velocity: decreased  General Gait Details: Pt was able to ambulate ~ 65 ft with RW with antalgic gait.   Stairs  General stair comments: pt unwilling to attempt stairs this session. Endorses pain/fatigue limiting.    Balance Overall balance assessment: Needs assistance Sitting-balance support: Feet supported Sitting balance-Leahy Scale: Good     Standing balance support: Bilateral upper extremity supported, During functional activity, Reliant on assistive device for balance Standing balance-Leahy Scale: Fair       Cognition Arousal/Alertness: Lethargic, Awake/alert Behavior During Therapy: WFL for tasks assessed/performed Overall Cognitive Status: Within Functional Limits for tasks assessed      General Comments: Ptis A and O but presents with lethargy. no family present to confirm baseline cognition/alertness. PT was able to consistently follow commands throughout.           General Comments General comments (skin integrity, edema, etc.): Reviewed  importance  of intake food + drinking and importance of OOB activity/performance of HEP. pt successfully urinated during session.      Pertinent Vitals/Pain Pain Assessment Pain Assessment: 0-10 Pain Score: 4  Pain Location: Left hip, near incision Pain Descriptors / Indicators: Aching Pain Intervention(s): Limited activity within patient's tolerance, Monitored during session, Repositioned     PT Goals (current goals can now be found in the care plan section) Acute Rehab PT Goals Patient Stated Goal: less pain and more energy Progress towards PT goals: Progressing toward goals    Frequency    BID      PT Plan Current plan remains appropriate       AM-PAC PT "6 Clicks" Mobility   Outcome Measure  Help needed turning from your back to your side while in a flat bed without using bedrails?: None Help needed moving from lying on your back to sitting on the side of a flat bed without using bedrails?: None Help needed moving to and from a bed to a chair (including a wheelchair)?: A Little Help needed standing up from a chair using your arms (e.g., wheelchair or bedside chair)?: A Little Help needed to walk in hospital room?: A Little Help needed climbing 3-5 steps with a railing? : A Lot 6 Click Score: 19    End of Session Equipment Utilized During Treatment: Gait belt Activity Tolerance: Patient tolerated treatment well;Patient limited by fatigue;Patient limited by lethargy Patient left: with call bell/phone within reach;in bed Nurse Communication: Mobility status PT Visit Diagnosis: Unsteadiness on feet (R26.81);Difficulty in walking, not elsewhere classified (R26.2);Other abnormalities of gait and mobility (R26.89);Repeated falls (R29.6);Muscle weakness (generalized) (M62.81);Other symptoms and signs involving the nervous system (R29.898)     Time: 1610-9604 PT Time Calculation (min) (ACUTE ONLY): 11 min  Charges:  $Therapeutic Activity: 8-22 mins                     Jetta Lout PTA 07/19/22, 11:24 AM

## 2022-07-19 NOTE — TOC Progression Note (Signed)
Transition of Care [facility]) - Progression Note    Patient Details  Name: Laura Love MRN: [id number] Date of Birth: 11-16-1940  [facility] CM/SW Contact  Marlowe Sax, RN Phone Number: 07/19/2022, 8:30 AM  Clinical Narrative:   Ins auth still pending, unable to go to STR until approved         Expected Discharge Plan and Services         Expected Discharge Date: 07/19/22                                     Social Determinants of Health (SDOH) Interventions SDOH Screenings   Food Insecurity: No Food Insecurity (07/17/2022)  Housing: Low Risk  (07/17/2022)  Transportation Needs: No Transportation Needs (07/17/2022)  Utilities: Not At Risk (07/17/2022)  Alcohol Screen: Low Risk  (05/12/2022)  Depression (PHQ2-9): Low Risk  (05/12/2022)  Financial Resource Strain: Low Risk  (04/20/2020)  Physical Activity: Inactive (04/20/2020)  Social Connections: Moderately Isolated (04/20/2020)  Stress: Stress Concern Present (04/20/2020)  Tobacco Use: Medium Risk (07/18/2022)    Readmission Risk Interventions     No data to display

## 2022-07-19 NOTE — Discharge Summary (Addendum)
Physician Discharge Summary  Subjective: 3 Days Post-Op Procedure(s) (LRB): TOTAL HIP ARTHROPLASTY (Left) Laura Love reports pain as mild. Was having pain last night that has since improved this AM   Laura Love seen in rounds with Dr. Ernest Pine. Laura Love is well, and has had no acute complaints or problems. Seen in halls ambulating with PT, and tolerating movement with minimal pain Laura Love is ready to go to [facility] [place]  Physician Discharge Summary  Laura Love ID: Laura Love MRN: [id number] DOB/AGE: Oct 21, 1940 82 y.o.  Admit date: 07/17/2022 Discharge date: 07/20/2022  Admission Diagnoses:  Discharge Diagnoses:  Principal Problem:   Hx of total hip arthroplasty, left   Discharged Condition: good  Hospital Course: Laura Love presented on 07/17/2022 for an elective left total hip arthroplasty performed by Dr. Ernest Pine.  Laura Love was given 2 g of Ancef and 1 of TXA prophylactically for perioperative antibiotics and blood thinning medication.  Laura Love tolerated surgery well without any complications.  See the operative note and details below.  Postoperatively Laura Love did well.  She was having some difficulty with a little bit of dizziness and nausea Laura Love first day working with physical therapy but reports improvement on day 2.  Was able to pass all of Laura Love PT protocols without any issues, able to do 4 stairs by day 2 and do ADLs with ease.  Laura Love states that she does not want to take celecoxib as it makes Laura Love feel not good, not prescribed celecoxib in hospital or as a take-home medication.  Laura Love was nervous that she was not having a bowel movement and stated that she was not on Laura Love normal bowel regimen. BP was running soft, has improved. Laura Love had trouble getting transportation for DC on 5/22 due to issues with timing of getting a ride to [facility] re[facility] facility.  Laura Love stable for discharge today.  PROCEDURE:  Left total hip arthroplasty   SURGEON:  Jena Gauss. M.D.   ASSISTANT:  Gean Birchwood, PA-C (present and scrubbed throughout the case, critical for assistance with exposure, retraction, instrumentation, and closure)   ANESTHESIA: spinal   ESTIMATED BLOOD LOSS: 75 mL   FLUIDS REPLACED: 1000 mL of crystalloid   DRAINS: 2 medium Hemovac drains   IMPLANTS UTILIZED: DePuy size 5 standard offset Actis femoral stem, 52 mm OD Pinnacle 100 acetabular component, +4 mm neutral Pinnacle Altrx polyethylene insert, and a 36 mm M-SPEC +5 mm hip ball Treatments: Given lactulose  Discharge Exam: Blood pressure (!) 133/51, pulse 61, temperature (!) 97.4 F (36.3 C), temperature source Temporal, resp. rate 15, height 5' 3.5" (1.613 m), weight 59.9 kg, SpO2 96 %.   Disposition: Discharge disposition: 03-Skilled Nursing Facility        Allergies as of 07/20/2022       Reactions   Alendronate Nausea Only   Crestor [rosuvastatin Calcium]    Cymbalta [duloxetine Hcl] Other (See Comments)   PUTS Laura Love IN BED FOR TWO DAYS    Duloxetine Nausea Only   Metoprolol    Confusion   Pentazocine Other (See Comments)        Medication List     STOP taking these medications    aspirin EC 81 MG tablet   ibuprofen 200 MG tablet Commonly known as: ADVIL       TAKE these medications    acetaminophen 325 MG tablet Commonly known as: TYLENOL Take 650 mg by mouth every 6 (six) hours as needed for moderate pain.   albuterol  108 (90 Base) MCG/ACT inhaler Commonly known as: Ventolin HFA Inhale 2 puffs into the lungs every 6 (six) hours as needed for wheezing or shortness of breath.   amLODipine 2.5 MG tablet Commonly known as: NORVASC TAKE 1 TABLET BY MOUTH DAILY What changed:  when to take this reasons to take this additional instructions   ascorbic acid 500 MG tablet Commonly known as: VITAMIN C Take 500 mg by mouth daily as needed.   Biotin 1000 MCG tablet Take 5,000 mcg by mouth daily.   buPROPion 150 MG 12 hr  tablet Commonly known as: WELLBUTRIN SR TAKE ONE TABLET TWICE DAILY What changed:  how to take this when to take this   CALCIUM PO Take 600 mg by mouth 2 (two) times daily. + D3 800 units   CENTRUM SILVER 50+WOMEN PO Take 1 tablet by mouth daily.   CLEAR EYES TRIPLE ACTION OP Apply 1-2 drops to eye 4 (four) times daily as needed.   dicyclomine 10 MG capsule Commonly known as: BENTYL Take 1 capsule (10 mg total) by mouth 4 (four) times daily -  before meals and at bedtime. As needed for abdominal pain   diphenhydrAMINE 25 MG tablet Commonly known as: BENADRYL Take 25 mg by mouth every 6 (six) hours as needed.   diphenhydramine-acetaminophen 25-500 MG Tabs tablet Commonly known as: TYLENOL PM Take 1 tablet by mouth at bedtime as needed.   enoxaparin 40 MG/0.4ML injection Commonly known as: LOVENOX Inject 0.4 mLs (40 mg total) into the skin daily for 13 days.   ezetimibe 10 MG tablet Commonly known as: ZETIA TAKE ONE TABLET EVERY DAY   hyoscyamine 0.125 MG Tbdp disintergrating tablet Commonly known as: ANASPAZ Place 1 tablet (0.125 mg total) under the tongue every 4 (four) hours as needed.   meclizine 12.5 MG tablet Commonly known as: ANTIVERT Take 12.5 mg by mouth 3 (three) times daily as needed for dizziness.   Melatonin 5 MG Caps Take 1 capsule by mouth every evening.   oxyCODONE 5 MG immediate release tablet Commonly known as: Oxy IR/ROXICODONE Take 1 tablet (5 mg total) by mouth every 4 (four) hours as needed for moderate pain (pain score 4-6).   Sennosides 25 MG Tabs Take 1 tablet by mouth daily.   sennosides-docusate sodium 8.6-50 MG tablet Commonly known as: SENOKOT-S Take 1 tablet by mouth daily as needed.   sodium chloride 0.65 % Soln nasal spray Commonly known as: OCEAN Place 1 spray into both nostrils as needed for congestion.   Synthroid 100 MCG tablet Generic drug: levothyroxine TAKE 1 TABLET BY MOUTH DAILY   traMADol 50 MG tablet Commonly  known as: ULTRAM Take 1-2 tablets (50-100 mg total) by mouth every 4 (four) hours as needed for moderate pain.   Vitamin D (Cholecalciferol) 25 MCG (1000 UT) Tabs Take 2 tablets by mouth daily.   Zinc 30 MG Caps Take 1 capsule by mouth daily as needed.   zoledronic acid 5 MG/100ML Soln injection Commonly known as: RECLAST Inject 5 mg into the vein. Once yearly starting June 2019 per Dr. Elie Goody [facility]  (From admission, onward)           Start     Ordered   07/17/22 1213  DME Walker rolling  Once       Question:  Laura Love needs a walker to treat with the following condition  Answer:  S/P total hip  arthroplasty   07/17/22 1212   07/17/22 1213  DME Bedside commode  Once       Comments: Laura Love is not able to walk the distance required to go the bathroom, or he/she is unable to safely negotiate stairs required to access the bathroom.  A 3in1 BSC will alleviate this problem  Question:  Laura Love needs a bedside commode to treat with the following condition  Answer:  S/P total hip arthroplasty   07/17/22 1212            Contact information for follow-up providers     Hooten, Illene Labrador, MD Follow up on 08/29/2022.   Specialty: Orthopedic Surgery Why: at 10:30am Contact information: [address] [facility] [place] [address] [phone]              Contact information for after-discharge care     Destination     [facility] .   Service: Skilled Nursing Contact information: [address] [place] [address] [phone]                     Signed: Gean Birchwood 07/20/2022, 7:51 AM   Objective: Vital signs in last 24 hours: Temp:  [97.4 F (36.3 C)-99.2 F (37.3 C)] 97.4 F (36.3 C) (05/23 0507) Pulse Rate:  [61-69] 61 (05/23 0507) Resp:  [14-16] 15 (05/23 0507) BP: (89-144)/(34-57) 133/51 (05/23 0507) SpO2:  [95 %-97 %] 96 % (05/23  0507)  Intake/Output from previous day: No intake or output data in the 24 hours ending 07/20/22 0751   Intake/Output this shift: No intake/output data recorded.  Labs: No results for input(s): "HGB" in the last 72 hours. No results for input(s): "WBC", "RBC", "HCT", "PLT" in the last 72 hours. No results for input(s): "NA", "K", "CL", "CO2", "BUN", "CREATININE", "GLUCOSE", "CALCIUM" in the last 72 hours. No results for input(s): "LABPT", "INR" in the last 72 hours.  EXAM: General - Laura Love is Alert, Appropriate, and Oriented Extremity - Neurologically intact ABD soft Neurovascular intact Sensation intact distally Intact pulses distally Dorsiflexion/Plantar flexion intact No cellulitis present Compartment soft Dressing - dressing C/D/I and no drainage Motor Function - intact, moving foot and toes well on exam.  Laura Love able to dorsi and plantarflex with good range of motion and strength.  Laura Love is also able to straight leg raise without any difficulty on Laura Love left side. JP drain site is clean and dry without drainage appreciated   Neurovascularly intact to all lower leg dermatomes and posterior tibial pulses appreciated (2+)  Seen ambulating with PT in the hallway before DC, tolerating movement well with minimal pain  Assessment/Plan: 3 Days Post-Op Procedure(s) (LRB): TOTAL HIP ARTHROPLASTY (Left) Procedure(s) (LRB): TOTAL HIP ARTHROPLASTY (Left) Past Medical History:  Diagnosis Date   Anxiety    Clavicular fracture    Colon polyps    Depression    History of chicken pox    History of measles    History of mumps    Hyperlipidemia    Hypertension    Hypoglycemia    Hypothyroidism    Osteoporosis    PONV (postoperative nausea and vomiting)    Principal Problem:   Hx of total hip arthroplasty, left  Estimated body mass index is 23.02 kg/m as calculated from the following:   Height as of this encounter: 5' 3.5" (1.613 m).   Weight as of this encounter: 59.9  kg. Up with therapy -continue to work with physical therapy today,  has already passed PT protocols for being discharged. Continue to work with PT at [facility]   Plan is to discharge to the [facility] [place] for rehabilitation.  Insurance approved, Laura Love will be discharged to the [facility] [place].   Laura Love will be sent to [facility] facility with prescriptions for pain medications including oxycodone and tramadol to help with as needed pain.  Laura Love will also be sent with prescriptions for Lovenox as DVT prophylaxis.  Laura Love states that she has not tolerated taking Celebrex in the past, so Celebrex has not been ordered.   She states that Laura Love nausea and dizziness has greatly improved.   Discussed with the Laura Love that she will keep the Aquacel bandage on for 5 more days before having it switched out for a honeycomb bandage that she will be sent home with.  PT to work with Laura [facility] at [facility] facility and help change his bandages out.   Staples will also be removed by physical therapy at the 2-week postoperative mark.   Laura Love urged to continue to utilize ice to help with swelling and inflammation   Laura Love will follow-up with [facility] clinic orthopedics for a 6-week evaluation with Dr. Ernest Pine.   Laura Love can follow-up with [facility] clinic orthopedics if has any concerns before the 6-week mark.   DVT Prophylaxis - Lovenox Weight Bearing As Tolerated left leg JP drain site is clean, no drainage appreciated Continue with physical therapy today, and at rehab facility Hip Preacutions discussed Diet - Regular diet Activity - WBAT Disposition - Rehab Condition Upon Discharge - Good  Danise Edge, PA-C Orthopaedic Surgery 07/20/2022, 7:51 AM

## 2022-07-19 NOTE — Progress Notes (Signed)
Pt c/o of "aggravating" pain, 8-9/10 on pain scale with grimacing and guarding on the site noted. Per previous shift RN, pt had an episode of hypotension this morning hence to not administer narcotic pain medicines if possible. Ice pack applied on the operative site, BP taken=144/57. PA Josh contacted via secure chat. He said OK to give Tramadol as long as pt is not overly sedated and if BP is stable. Tramadol 50mg  administered, will continue to monitor BP closely and attend to pt's needs.

## 2022-07-19 NOTE — Plan of Care (Signed)
  Problem: Pain Management: Goal: Pain level will decrease with appropriate interventions Outcome: Progressing   Problem: Skin Integrity: Goal: Will show signs of wound healing Outcome: Progressing   

## 2022-07-20 DIAGNOSIS — F32A Depression, unspecified: Secondary | ICD-10-CM | POA: Diagnosis not present

## 2022-07-20 DIAGNOSIS — E039 Hypothyroidism, unspecified: Secondary | ICD-10-CM | POA: Diagnosis not present

## 2022-07-20 DIAGNOSIS — R2689 Other abnormalities of gait and mobility: Secondary | ICD-10-CM | POA: Diagnosis not present

## 2022-07-20 DIAGNOSIS — M1612 Unilateral primary osteoarthritis, left hip: Secondary | ICD-10-CM | POA: Diagnosis not present

## 2022-07-20 DIAGNOSIS — G4700 Insomnia, unspecified: Secondary | ICD-10-CM | POA: Diagnosis not present

## 2022-07-20 DIAGNOSIS — Z471 Aftercare following joint replacement surgery: Secondary | ICD-10-CM | POA: Diagnosis not present

## 2022-07-20 DIAGNOSIS — M6281 Muscle weakness (generalized): Secondary | ICD-10-CM | POA: Diagnosis not present

## 2022-07-20 DIAGNOSIS — E785 Hyperlipidemia, unspecified: Secondary | ICD-10-CM | POA: Diagnosis not present

## 2022-07-20 DIAGNOSIS — Z96642 Presence of left artificial hip joint: Secondary | ICD-10-CM | POA: Diagnosis not present

## 2022-07-20 DIAGNOSIS — I10 Essential (primary) hypertension: Secondary | ICD-10-CM | POA: Diagnosis not present

## 2022-07-20 DIAGNOSIS — F419 Anxiety disorder, unspecified: Secondary | ICD-10-CM | POA: Diagnosis not present

## 2022-07-20 DIAGNOSIS — Z79899 Other long term (current) drug therapy: Secondary | ICD-10-CM | POA: Diagnosis not present

## 2022-07-20 DIAGNOSIS — R262 Difficulty in walking, not elsewhere classified: Secondary | ICD-10-CM | POA: Diagnosis not present

## 2022-07-20 DIAGNOSIS — M542 Cervicalgia: Secondary | ICD-10-CM | POA: Diagnosis not present

## 2022-07-20 MED ORDER — CELECOXIB 200 MG PO CAPS
ORAL_CAPSULE | ORAL | Status: AC
  Filled 2022-07-20: qty 1

## 2022-07-20 MED ORDER — MAGNESIUM HYDROXIDE 400 MG/5ML PO SUSP
ORAL | Status: AC
  Filled 2022-07-20: qty 30

## 2022-07-20 MED ORDER — SENNOSIDES-DOCUSATE SODIUM 8.6-50 MG PO TABS
ORAL_TABLET | ORAL | Status: AC
  Filled 2022-07-20: qty 1

## 2022-07-20 MED ORDER — ENOXAPARIN SODIUM 40 MG/0.4ML IJ SOSY
PREFILLED_SYRINGE | INTRAMUSCULAR | Status: AC
  Filled 2022-07-20: qty 0.4

## 2022-07-20 MED ORDER — PANTOPRAZOLE SODIUM 40 MG PO TBEC
DELAYED_RELEASE_TABLET | ORAL | Status: AC
  Filled 2022-07-20: qty 1

## 2022-07-20 MED ORDER — TRAMADOL HCL 50 MG PO TABS
ORAL_TABLET | ORAL | Status: AC
  Filled 2022-07-20: qty 1

## 2022-07-20 MED ORDER — ACETAMINOPHEN 325 MG PO TABS
ORAL_TABLET | ORAL | Status: AC
  Filled 2022-07-20: qty 2

## 2022-07-20 NOTE — Progress Notes (Signed)
Physical Therapy Treatment Patient Details Name: Laura Love MRN: [id number] DOB: 08-Jan-1941 Today's Date: 07/20/2022   History of Present Illness Laura Love is an 81yoF who comes to [facility] on 07/17/22 for elective THA with Dr. Ernest Pine. Pt lives at the [address] of [place] ([place]).    PT Comments    Pt was seated EOB with MD present, upon arrival. She is much more alert this morning then previous. BP 133/57 (77). No signs/symptoms of distress. She was re-educated several times throughout session on posterior hip precautions. Pt does however continue to struggle with recall. Overall, pt is progressing well. She was safely able to stand, ambulate, and perform stairs with vcs for improved sequencing and posture. Pt will continue to benefit from post acute PT to maximize her independence and safety with all ADLs.    Recommendations for follow up therapy are one component of a multi-disciplinary discharge planning process, led by the attending physician.  Recommendations may be updated based on patient status, additional functional criteria and insurance authorization.     Assistance Recommended at Discharge Set up Supervision/Assistance  Patient can return home with the following A little help with walking and/or transfers;A little help with bathing/dressing/bathroom;Assistance with cooking/housework;Assistance with feeding;Direct supervision/assist for medications management;Direct supervision/assist for financial management;Assist for transportation;Help with stairs or ramp for entrance   Equipment Recommendations  Other (comment) (pt endorses having equipment needs met already)       Precautions / Restrictions Precautions Precautions: Posterior Hip;Fall Precaution Booklet Issued: Yes (comment) Restrictions Weight Bearing Restrictions: Yes LLE Weight Bearing: Weight bearing as tolerated     Mobility  Bed Mobility  General bed mobility comments: Pt was seated EOB upon arrival.     Transfers Overall transfer level: Needs assistance Equipment used: Rolling walker (2 wheels) Transfers: Sit to/from Stand Sit to Stand: Supervision   Ambulation/Gait Ambulation/Gait assistance: Supervision Gait Distance (Feet): 200 Feet Assistive device: Rolling walker (2 wheels) Gait Pattern/deviations: Step-through pattern, Antalgic Gait velocity: decreased  General Gait Details: Pt demonstrated safe steady gait kinematics without LOB. VBCs for heel strike and to not twist/pivot when turning.   Stairs Stairs: Yes Stairs assistance: Supervision Stair Management: Two rails, Forwards Number of Stairs: 8 General stair comments: pt performed ascending/descending 4 stair 2 x with vcs for sequencing     Balance Overall balance assessment: Needs assistance Sitting-balance support: Feet supported Sitting balance-Leahy Scale: Good     Standing balance support: Bilateral upper extremity supported, During functional activity, Reliant on assistive device for balance Standing balance-Leahy Scale: Good Standing balance comment: Pt has no LOB with BUE support on RW     Cognition Arousal/Alertness: Awake/alert Behavior During Therapy: WFL for tasks assessed/performed Overall Cognitive Status: Within Functional Limits for tasks assessed    General Comments: Pt is A and O x4. Agreeable to session. Does require education for remembering hip precautions. reviewed hip precautions 4 x during session with pt still struggling to recall           General Comments General comments (skin integrity, edema, etc.): Reviewed HEP and importance of hip precautions. pt continues to struggle with recall of precautions however was able to adhere throughout session.      Pertinent Vitals/Pain Pain Assessment Pain Assessment: 0-10 Pain Score: 2  Pain Location: Left hip, near incision Pain Descriptors / Indicators: Aching Pain Intervention(s): Limited activity within patient's tolerance, Monitored  during session, Premedicated before session, Repositioned, Ice applied     PT Goals (current goals can now be found in the  care plan section) Acute Rehab PT Goals Patient Stated Goal: Get well again Progress towards PT goals: Progressing toward goals    Frequency    BID      PT Plan Current plan remains appropriate       AM-PAC PT "6 Clicks" Mobility   Outcome Measure  Help needed turning from your back to your side while in a flat bed without using bedrails?: None Help needed moving from lying on your back to sitting on the side of a flat bed without using bedrails?: None Help needed moving to and from a bed to a chair (including a wheelchair)?: A Little Help needed standing up from a chair using your arms (e.g., wheelchair or bedside chair)?: A Little Help needed to walk in hospital room?: A Little Help needed climbing 3-5 steps with a railing? : A Little 6 Click Score: 20    End of Session Equipment Utilized During Treatment: Gait belt Activity Tolerance: Patient tolerated treatment well Patient left: in chair;with call bell/phone within reach;with chair alarm set Nurse Communication: Mobility status PT Visit Diagnosis: Unsteadiness on feet (R26.81);Difficulty in walking, not elsewhere classified (R26.2);Other abnormalities of gait and mobility (R26.89);Repeated falls (R29.6);Muscle weakness (generalized) (M62.81);Other symptoms and signs involving the nervous system (R29.898)     Time: 4098-1191 PT Time Calculation (min) (ACUTE ONLY): 23 min  Charges:  $Gait Training: 23-37 mins                     Jetta Lout PTA 07/20/22, 8:32 AM

## 2022-07-20 NOTE — Progress Notes (Signed)
Report called to [facility].

## 2022-07-20 NOTE — Progress Notes (Signed)
Patient was picked up by facility transport.No signs of acute distress.

## 2022-07-20 NOTE — Plan of Care (Signed)
  Problem: Activity: Goal: Ability to avoid complications of mobility impairment will improve Outcome: Progressing   Problem: Pain Management: Goal: Pain level will decrease with appropriate interventions Outcome: Progressing   Problem: Skin Integrity: Goal: Will show signs of wound healing Outcome: Progressing   

## 2022-07-21 DIAGNOSIS — M6281 Muscle weakness (generalized): Secondary | ICD-10-CM | POA: Diagnosis not present

## 2022-07-21 DIAGNOSIS — R2689 Other abnormalities of gait and mobility: Secondary | ICD-10-CM | POA: Diagnosis not present

## 2022-07-21 DIAGNOSIS — Z471 Aftercare following joint replacement surgery: Secondary | ICD-10-CM | POA: Diagnosis not present

## 2022-07-21 DIAGNOSIS — R262 Difficulty in walking, not elsewhere classified: Secondary | ICD-10-CM | POA: Diagnosis not present

## 2022-07-21 LAB — SURGICAL PATHOLOGY

## 2022-07-24 DIAGNOSIS — M6281 Muscle weakness (generalized): Secondary | ICD-10-CM | POA: Diagnosis not present

## 2022-07-24 DIAGNOSIS — R262 Difficulty in walking, not elsewhere classified: Secondary | ICD-10-CM | POA: Diagnosis not present

## 2022-07-24 DIAGNOSIS — R2689 Other abnormalities of gait and mobility: Secondary | ICD-10-CM | POA: Diagnosis not present

## 2022-07-24 DIAGNOSIS — Z471 Aftercare following joint replacement surgery: Secondary | ICD-10-CM | POA: Diagnosis not present

## 2022-07-25 ENCOUNTER — Ambulatory Visit: Payer: Self-pay

## 2022-07-25 DIAGNOSIS — Z471 Aftercare following joint replacement surgery: Secondary | ICD-10-CM | POA: Diagnosis not present

## 2022-07-25 DIAGNOSIS — R262 Difficulty in walking, not elsewhere classified: Secondary | ICD-10-CM | POA: Diagnosis not present

## 2022-07-25 DIAGNOSIS — R2689 Other abnormalities of gait and mobility: Secondary | ICD-10-CM | POA: Diagnosis not present

## 2022-07-25 DIAGNOSIS — M6281 Muscle weakness (generalized): Secondary | ICD-10-CM | POA: Diagnosis not present

## 2022-07-25 DIAGNOSIS — R609 Edema, unspecified: Secondary | ICD-10-CM | POA: Diagnosis not present

## 2022-07-25 DIAGNOSIS — I10 Essential (primary) hypertension: Secondary | ICD-10-CM | POA: Diagnosis not present

## 2022-07-25 NOTE — Telephone Encounter (Signed)
Chief Complaint: Full body swelling - weight gain of 7 lbs in a few days. BP of 189/94 Symptoms:  Frequency: past few days Pertinent Negatives: Patient denies difficulty breathing, fever Disposition: [x] ED /[] Urgent Care (no appt availability in office) / [] Appointment(In office/virtual)/ []  [facility]/ [] Home Care/ [] Refused Recommended Disposition /[]  Mobile Bus/ []  Follow-up with PCP Additional Notes: PT call with complaint of full body swelling. She states her loose pants are tight and she was unable to get her foot out of her shoe last night. Pt has gained 7 lbs over the past few days. Pt is recovering from hip sx. No appt available in office this afternoon. Placed pt on hold and called[facility] [place], who transferred me to [facility]. Suli stated that Dr. Sherrie Mustache is not in the office and there was one one to take a VV.  Returned to pt. While waiting pt had taken her BP and reports BP of 189/94, HR of 91. Pt states she never has BP this high. PT has a fob to call EMS. PT cannot drive. Pt will summon EMS to take her to ED for evaluation. Made follow up appt for tomorrow.  PT is nervous about going to hospital.    Summary: Swelling all over   Rapid weight gain, unusual, advised to contact PCP. Pt is concerned and has questions prior to scheduling appt. Since she has been back in her apt  Best contact: [phone]  Swelling everywhere, could not get her shoes off. Has all happened in the last 3 days.     Reason for Disposition  SEVERE leg swelling (e.g., swelling extends above knee, entire leg is swollen, weeping fluid)  Sounds like a life-threatening emergency to the triager  Answer Assessment - Initial Assessment Questions 1. ONSET: "When did the swelling start?" (e.g., minutes, hours, days)     Yesterday  - a few days ago 2. LOCATION: "What part of the leg is swollen?"  "Are both legs swollen or just one leg?"     Whole 3. SEVERITY: "How bad is the swelling?"  (e.g., localized; mild, moderate, severe)   - Localized: Small area of swelling localized to one leg.   - MILD pedal edema: Swelling limited to foot and ankle, pitting edema < 1/4 inch (6 mm) deep, rest and elevation eliminate most or all swelling.   - MODERATE edema: Swelling of lower leg to knee, pitting edema > 1/4 inch (6 mm) deep, rest and elevation only partially reduce swelling.   - SEVERE edema: Swelling extends above knee, facial or hand swelling present.      Severe 4. REDNESS: "Does the swelling look red or infected?"     no 5. PAIN: "Is the swelling painful to touch?" If Yes, ask: "How painful is it?"   (Scale 1-10; mild, moderate or severe)     no 6. FEVER: "Do you have a fever?" If Yes, ask: "What is it, how was it measured, and when did it start?"      no 7. CAUSE: "What do you think is causing the leg swelling?"     Unsure 8. MEDICAL HISTORY: "Do you have a history of blood clots (e.g., DVT), cancer, heart failure, kidney disease, or liver failure?"     Recent hip surgery 9. RECURRENT SYMPTOM: "Have you had leg swelling before?" If Yes, ask: "When was the last time?" "What happened that time?"     *No Answer* 10. OTHER SYMPTOMS: "Do you have any other symptoms?" (e.g., chest pain,  difficulty breathing)       *No Answer*  Answer Assessment - Initial Assessment Questions 1. BLOOD PRESSURE: "What is the blood pressure?" "Did you take at least two measurements 5 minutes apart?"     189/94 Hr 91 2. ONSET: "When did you take your blood pressure?"     now 3. HOW: "How did you take your blood pressure?" (e.g., automatic home BP monitor, visiting nurse)     Automatic 4. HISTORY: "Do you have a history of high blood pressure?"     no 5. MEDICINES: "Are you taking any medicines for blood pressure?" "Have you missed any doses recently?"     ni 6. OTHER SYMPTOMS: "Do you have any symptoms?" (e.g., blurred vision, chest pain, difficulty breathing, headache, weakness)      no  Protocols used: Leg Swelling and Edema-A-AH, Blood Pressure - High-A-AH

## 2022-07-26 ENCOUNTER — Ambulatory Visit: Payer: Medicare HMO | Admitting: Physician Assistant

## 2022-07-26 NOTE — Telephone Encounter (Signed)
If swelling has not resolved then she needs to go to ER to have evaluated if she hasn't already

## 2022-07-26 NOTE — Telephone Encounter (Signed)
Patient reports she is not having any pain or shortness of breath. She was evaluated by EMS last night. Patient does not feel she needs to be seen in office or ED.

## 2022-07-26 NOTE — Progress Notes (Deleted)
Established patient visit  Patient: Laura Love   DOB: [birth year]   82 y.o. Female  MRN: [id number] Visit Date: 07/26/2022  Today's healthcare provider: Debera Lat, PA-C   No chief complaint on file.  Subjective    HPI  ***     05/12/2022    1:14 PM 01/04/2022   10:49 AM 08/11/2021    2:24 PM  Depression screen PHQ 2/9  Decreased Interest 0 0 0  Down, Depressed, Hopeless 0 0 0  PHQ - 2 Score 0 0 0  Altered sleeping 0 0 0  Tired, decreased energy 0 0 0  Change in appetite 0 0 0  Feeling bad or failure about yourself  0 0 0  Trouble concentrating 0 0 0  Moving slowly or fidgety/restless 0 0 0  Suicidal thoughts 0 0 0  PHQ-9 Score 0 0 0  Difficult doing work/chores Not difficult at all Not difficult at all        No data to display          Medications: Outpatient Medications Prior to Visit  Medication Sig   acetaminophen (TYLENOL) 325 MG tablet Take 650 mg by mouth every 6 (six) hours as needed for moderate pain.   albuterol (VENTOLIN HFA) 108 (90 Base) MCG/ACT inhaler Inhale 2 puffs into the lungs every 6 (six) hours as needed for wheezing or shortness of breath.   amLODipine (NORVASC) 2.5 MG tablet TAKE 1 TABLET BY MOUTH DAILY (Patient taking differently: Take 2.5 mg by mouth daily as needed. For Systolic BP > 160)   Biotin 1000 MCG tablet Take 5,000 mcg by mouth daily.   buPROPion (WELLBUTRIN SR) 150 MG 12 hr tablet TAKE ONE TABLET TWICE DAILY (Patient taking differently: 150 mg daily.)   CALCIUM PO Take 600 mg by mouth 2 (two) times daily. + D3 800 units   dicyclomine (BENTYL) 10 MG capsule Take 1 capsule (10 mg total) by mouth 4 (four) times daily -  before meals and at bedtime. As needed for abdominal pain   diphenhydrAMINE (BENADRYL) 25 MG tablet Take 25 mg by mouth every 6 (six) hours as needed.   diphenhydramine-acetaminophen (TYLENOL PM) 25-500 MG TABS tablet Take 1 tablet by mouth at bedtime as needed.   enoxaparin (LOVENOX) 40 MG/0.4ML injection Inject  0.4 mLs (40 mg total) into the skin daily for 13 days.   ezetimibe (ZETIA) 10 MG tablet TAKE ONE TABLET EVERY DAY (Patient taking differently: Take 10 mg by mouth daily.)   hyoscyamine (ANASPAZ) 0.125 MG TBDP disintergrating tablet Place 1 tablet (0.125 mg total) under the tongue every 4 (four) hours as needed.   meclizine (ANTIVERT) 12.5 MG tablet Take 12.5 mg by mouth 3 (three) times daily as needed for dizziness.   Melatonin 5 MG CAPS Take 1 capsule by mouth every evening.   Multiple Vitamins-Minerals (CENTRUM SILVER 50+WOMEN PO) Take 1 tablet by mouth daily.   oxyCODONE (OXY IR/ROXICODONE) 5 MG immediate release tablet Take 1 tablet (5 mg total) by mouth every 4 (four) hours as needed for moderate pain (pain score 4-6).   Sennosides 25 MG TABS Take 1 tablet by mouth daily.   sennosides-docusate sodium (SENOKOT-S) 8.6-50 MG tablet Take 1 tablet by mouth daily as needed.   sodium chloride (OCEAN) 0.65 % SOLN nasal spray Place 1 spray into both nostrils as needed for congestion.   SYNTHROID 100 MCG tablet TAKE 1 TABLET BY MOUTH DAILY   Tetrahydroz-Polyvinyl Al-Povid (CLEAR EYES TRIPLE ACTION OP) Apply 1-2 drops  to eye 4 (four) times daily as needed.   traMADol (ULTRAM) 50 MG tablet Take 1-2 tablets (50-100 mg total) by mouth every 4 (four) hours as needed for moderate pain.   vitamin C (ASCORBIC ACID) 500 MG tablet Take 500 mg by mouth daily as needed.   Vitamin D, Cholecalciferol, 1000 UNITS TABS Take 2 tablets by mouth daily.   Zinc 30 MG CAPS Take 1 capsule by mouth daily as needed.   zoledronic acid (RECLAST) 5 MG/100ML SOLN injection Inject 5 mg into the vein. Once yearly starting June 2019 per Dr. Tedd Sias   No facility-administered medications prior to visit.    Review of Systems Except see HPI   {Labs  Heme  Chem  Endocrine  Serology  Results Review (optional):23779}   Objective    There were no vitals taken for this visit. {Show previous vital signs  (optional):23777}  Physical Exam   No results found for any visits on 07/26/22.  Assessment & Plan    ***  No follow-ups on file.     The patient was advised to call back or seek an in-person evaluation if the symptoms worsen or if the condition fails to improve as anticipated.  I discussed the assessment and treatment plan with the patient. The patient was provided an opportunity to ask questions and all were answered. The patient agreed with the plan and demonstrated an understanding of the instructions.  I, Debera Lat, PA-C have reviewed all documentation for this visit. The documentation on 5/04/07/22  for the exam, diagnosis, procedures, and orders are all accurate and complete.  Debera Lat, [facility], MMS [facility] [phone] (phone) [phone] (fax)  [facility]

## 2022-07-27 DIAGNOSIS — M6281 Muscle weakness (generalized): Secondary | ICD-10-CM | POA: Diagnosis not present

## 2022-07-27 DIAGNOSIS — Z471 Aftercare following joint replacement surgery: Secondary | ICD-10-CM | POA: Diagnosis not present

## 2022-07-27 DIAGNOSIS — R262 Difficulty in walking, not elsewhere classified: Secondary | ICD-10-CM | POA: Diagnosis not present

## 2022-07-27 DIAGNOSIS — R2689 Other abnormalities of gait and mobility: Secondary | ICD-10-CM | POA: Diagnosis not present

## 2022-07-28 DIAGNOSIS — R262 Difficulty in walking, not elsewhere classified: Secondary | ICD-10-CM | POA: Diagnosis not present

## 2022-07-28 DIAGNOSIS — M6281 Muscle weakness (generalized): Secondary | ICD-10-CM | POA: Diagnosis not present

## 2022-07-28 DIAGNOSIS — Z471 Aftercare following joint replacement surgery: Secondary | ICD-10-CM | POA: Diagnosis not present

## 2022-07-28 DIAGNOSIS — R2689 Other abnormalities of gait and mobility: Secondary | ICD-10-CM | POA: Diagnosis not present

## 2022-07-31 DIAGNOSIS — R2689 Other abnormalities of gait and mobility: Secondary | ICD-10-CM | POA: Diagnosis not present

## 2022-07-31 DIAGNOSIS — R262 Difficulty in walking, not elsewhere classified: Secondary | ICD-10-CM | POA: Diagnosis not present

## 2022-07-31 DIAGNOSIS — Z471 Aftercare following joint replacement surgery: Secondary | ICD-10-CM | POA: Diagnosis not present

## 2022-07-31 DIAGNOSIS — M6281 Muscle weakness (generalized): Secondary | ICD-10-CM | POA: Diagnosis not present

## 2022-08-01 DIAGNOSIS — M6281 Muscle weakness (generalized): Secondary | ICD-10-CM | POA: Diagnosis not present

## 2022-08-01 DIAGNOSIS — R262 Difficulty in walking, not elsewhere classified: Secondary | ICD-10-CM | POA: Diagnosis not present

## 2022-08-01 DIAGNOSIS — R2689 Other abnormalities of gait and mobility: Secondary | ICD-10-CM | POA: Diagnosis not present

## 2022-08-01 DIAGNOSIS — Z471 Aftercare following joint replacement surgery: Secondary | ICD-10-CM | POA: Diagnosis not present

## 2022-08-03 DIAGNOSIS — R262 Difficulty in walking, not elsewhere classified: Secondary | ICD-10-CM | POA: Diagnosis not present

## 2022-08-03 DIAGNOSIS — Z471 Aftercare following joint replacement surgery: Secondary | ICD-10-CM | POA: Diagnosis not present

## 2022-08-03 DIAGNOSIS — M6281 Muscle weakness (generalized): Secondary | ICD-10-CM | POA: Diagnosis not present

## 2022-08-03 DIAGNOSIS — R2689 Other abnormalities of gait and mobility: Secondary | ICD-10-CM | POA: Diagnosis not present

## 2022-08-04 DIAGNOSIS — R262 Difficulty in walking, not elsewhere classified: Secondary | ICD-10-CM | POA: Diagnosis not present

## 2022-08-04 DIAGNOSIS — M6281 Muscle weakness (generalized): Secondary | ICD-10-CM | POA: Diagnosis not present

## 2022-08-04 DIAGNOSIS — R2689 Other abnormalities of gait and mobility: Secondary | ICD-10-CM | POA: Diagnosis not present

## 2022-08-04 DIAGNOSIS — Z471 Aftercare following joint replacement surgery: Secondary | ICD-10-CM | POA: Diagnosis not present

## 2022-08-07 DIAGNOSIS — Z471 Aftercare following joint replacement surgery: Secondary | ICD-10-CM | POA: Diagnosis not present

## 2022-08-07 DIAGNOSIS — R2689 Other abnormalities of gait and mobility: Secondary | ICD-10-CM | POA: Diagnosis not present

## 2022-08-07 DIAGNOSIS — R262 Difficulty in walking, not elsewhere classified: Secondary | ICD-10-CM | POA: Diagnosis not present

## 2022-08-07 DIAGNOSIS — M6281 Muscle weakness (generalized): Secondary | ICD-10-CM | POA: Diagnosis not present

## 2022-08-08 DIAGNOSIS — M6281 Muscle weakness (generalized): Secondary | ICD-10-CM | POA: Diagnosis not present

## 2022-08-08 DIAGNOSIS — R2689 Other abnormalities of gait and mobility: Secondary | ICD-10-CM | POA: Diagnosis not present

## 2022-08-08 DIAGNOSIS — Z471 Aftercare following joint replacement surgery: Secondary | ICD-10-CM | POA: Diagnosis not present

## 2022-08-08 DIAGNOSIS — R262 Difficulty in walking, not elsewhere classified: Secondary | ICD-10-CM | POA: Diagnosis not present

## 2022-08-10 DIAGNOSIS — R2689 Other abnormalities of gait and mobility: Secondary | ICD-10-CM | POA: Diagnosis not present

## 2022-08-10 DIAGNOSIS — M6281 Muscle weakness (generalized): Secondary | ICD-10-CM | POA: Diagnosis not present

## 2022-08-10 DIAGNOSIS — Z471 Aftercare following joint replacement surgery: Secondary | ICD-10-CM | POA: Diagnosis not present

## 2022-08-10 DIAGNOSIS — R262 Difficulty in walking, not elsewhere classified: Secondary | ICD-10-CM | POA: Diagnosis not present

## 2022-08-11 ENCOUNTER — Other Ambulatory Visit: Payer: Self-pay | Admitting: Family Medicine

## 2022-08-11 DIAGNOSIS — Z471 Aftercare following joint replacement surgery: Secondary | ICD-10-CM | POA: Diagnosis not present

## 2022-08-11 DIAGNOSIS — R2689 Other abnormalities of gait and mobility: Secondary | ICD-10-CM | POA: Diagnosis not present

## 2022-08-11 DIAGNOSIS — M6281 Muscle weakness (generalized): Secondary | ICD-10-CM | POA: Diagnosis not present

## 2022-08-11 DIAGNOSIS — E785 Hyperlipidemia, unspecified: Secondary | ICD-10-CM

## 2022-08-11 DIAGNOSIS — R262 Difficulty in walking, not elsewhere classified: Secondary | ICD-10-CM | POA: Diagnosis not present

## 2022-08-14 DIAGNOSIS — R262 Difficulty in walking, not elsewhere classified: Secondary | ICD-10-CM | POA: Diagnosis not present

## 2022-08-14 DIAGNOSIS — Z471 Aftercare following joint replacement surgery: Secondary | ICD-10-CM | POA: Diagnosis not present

## 2022-08-14 DIAGNOSIS — M6281 Muscle weakness (generalized): Secondary | ICD-10-CM | POA: Diagnosis not present

## 2022-08-14 DIAGNOSIS — R2689 Other abnormalities of gait and mobility: Secondary | ICD-10-CM | POA: Diagnosis not present

## 2022-08-15 DIAGNOSIS — Z471 Aftercare following joint replacement surgery: Secondary | ICD-10-CM | POA: Diagnosis not present

## 2022-08-15 DIAGNOSIS — R2689 Other abnormalities of gait and mobility: Secondary | ICD-10-CM | POA: Diagnosis not present

## 2022-08-15 DIAGNOSIS — R262 Difficulty in walking, not elsewhere classified: Secondary | ICD-10-CM | POA: Diagnosis not present

## 2022-08-15 DIAGNOSIS — M6281 Muscle weakness (generalized): Secondary | ICD-10-CM | POA: Diagnosis not present

## 2022-08-17 DIAGNOSIS — M6281 Muscle weakness (generalized): Secondary | ICD-10-CM | POA: Diagnosis not present

## 2022-08-17 DIAGNOSIS — R2689 Other abnormalities of gait and mobility: Secondary | ICD-10-CM | POA: Diagnosis not present

## 2022-08-17 DIAGNOSIS — R262 Difficulty in walking, not elsewhere classified: Secondary | ICD-10-CM | POA: Diagnosis not present

## 2022-08-17 DIAGNOSIS — Z471 Aftercare following joint replacement surgery: Secondary | ICD-10-CM | POA: Diagnosis not present

## 2022-08-18 DIAGNOSIS — M6281 Muscle weakness (generalized): Secondary | ICD-10-CM | POA: Diagnosis not present

## 2022-08-18 DIAGNOSIS — R2689 Other abnormalities of gait and mobility: Secondary | ICD-10-CM | POA: Diagnosis not present

## 2022-08-18 DIAGNOSIS — R262 Difficulty in walking, not elsewhere classified: Secondary | ICD-10-CM | POA: Diagnosis not present

## 2022-08-18 DIAGNOSIS — Z471 Aftercare following joint replacement surgery: Secondary | ICD-10-CM | POA: Diagnosis not present

## 2022-08-21 DIAGNOSIS — R2689 Other abnormalities of gait and mobility: Secondary | ICD-10-CM | POA: Diagnosis not present

## 2022-08-21 DIAGNOSIS — Z471 Aftercare following joint replacement surgery: Secondary | ICD-10-CM | POA: Diagnosis not present

## 2022-08-21 DIAGNOSIS — M170 Bilateral primary osteoarthritis of knee: Secondary | ICD-10-CM | POA: Diagnosis not present

## 2022-08-21 DIAGNOSIS — M6281 Muscle weakness (generalized): Secondary | ICD-10-CM | POA: Diagnosis not present

## 2022-08-23 DIAGNOSIS — M810 Age-related osteoporosis without current pathological fracture: Secondary | ICD-10-CM | POA: Diagnosis not present

## 2022-08-23 DIAGNOSIS — E039 Hypothyroidism, unspecified: Secondary | ICD-10-CM | POA: Diagnosis not present

## 2022-08-28 ENCOUNTER — Ambulatory Visit: Payer: Self-pay

## 2022-08-28 ENCOUNTER — Telehealth: Payer: Self-pay | Admitting: Family Medicine

## 2022-08-28 DIAGNOSIS — M170 Bilateral primary osteoarthritis of knee: Secondary | ICD-10-CM | POA: Diagnosis not present

## 2022-08-28 NOTE — Telephone Encounter (Addendum)
Chief Complaint: High BP readings due to stress Symptoms: None Frequency: Off and on Pertinent Negatives: Patient denies symptoms Disposition: [] ED /[] Urgent Care (no appt availability in office) / [x] Appointment(In office/virtual)/ []  [place] [facility]/ [] Home Care/ [] Refused Recommended Disposition /[] [place] [facility]/ [x]  Follow-up with PCP Additional Notes: Patient called and says that her BP has been high due to the stress she's experiencing. She says this morning her BP was 196/100 when she took it and she took the amlodipine 2.5 mg. She says she was at a doctor's visit today and her BP was high as well. Once she got home and took some more amlodipine, her BP came down to 128/63 right before calling. She says she thinks she took 10 pills, but is unsure. She says Dr. Sherrie Mustache told her to take it when she gets stressed and her BP goes up, so she's been taking right many pills and knows this is not the right thing to do. She says she spoke with a friend and was told lisinopril she can take as needed not amlodipine. She says she wants to talk with Dr. Sherrie Mustache to determine the best thing for her to do. I advised his first available is not until 09/25/22. She says she only wants to see him. I advised I will send this to Dr. Sherrie Mustache and if he wants to see her in the office, someone will call with that appointment. She says if she can come by tomorrow that would be great, since she's going to be at the [facility] for an appointment tomorrow.     Summary: dosage increase   Pt returned nurse call. Cb# [phone]  ----- Message from Elon Jester sent at 08/28/2022  3:10 PM EDT ----- Patient called said she is under a lot of stress and she wants to see if provider will up her dosage for amLODipine (NORVASC) 2.5 MG tablet to 10mg  or more as she took 16mg  other day and it worked for her. Please f/u with patient  ----- Message from Elon Jester sent at 08/28/2022  3:09 PM EDT ----- Patient  called said she is under a lot of stress and she wants to see if provider will up her dosage for amLODipine (NORVASC) 2.5 MG tablet to 10mg  or more as she took 16mg  other day and it worked for her. Please f/u with patient         Reason for Disposition  [1] Systolic BP  >= 130 OR Diastolic >= 80 AND [2] taking BP medications  Answer Assessment - Initial Assessment Questions 1. BLOOD PRESSURE: "What is the blood pressure?" "Did you take at least two measurements 5 minutes apart?"     196/100, now 128/63 2. ONSET: "When did you take your blood pressure?"     This morning (it was up) and just now 3. HOW: "How did you take your blood pressure?" (e.g., automatic home BP monitor, visiting nurse)     Automatic home BP monitor 4. HISTORY: "Do you have a history of high blood pressure?"     BP 5. MEDICINES: "Are you taking any medicines for blood pressure?" "Have you missed any doses recently?"     Amlodipine  6. OTHER SYMPTOMS: "Do you have any symptoms?" (e.g., blurred vision, chest pain, difficulty breathing, headache, weakness)     No  Protocols used: Blood Pressure - High-A-AH

## 2022-08-28 NOTE — Telephone Encounter (Signed)
Summary: dosage increase   Patient called said she is under a lot of stress and she wants to see if provider will up her dosage for amLODipine (NORVASC) 2.5 MG tablet to 10mg  or more as she took 16mg  other day and it worked for her. Please f/u with patient  ----- Message from Elon Jester sent at 08/28/2022  3:09 PM EDT ----- Patient called said she is under a lot of stress and she wants to see if provider will up her dosage for amLODipine (NORVASC) 2.5 MG tablet to 10mg  or more as she took 16mg  other day and it worked for her. Please f/u with patient      Called pt -  Left message on machine to return our call.

## 2022-08-28 NOTE — Telephone Encounter (Signed)
Summary: dosage increase   Patient called said she is under a lot of stress and she wants to see if provider will up her dosage for amLODipine (NORVASC) 2.5 MG tablet to 10mg  or more as she took 16mg  other day and it worked for her. Please f/u with patient  ----- Message from Elon Jester sent at 08/28/2022  3:09 PM EDT ----- Patient called said she is under a lot of stress and she wants to see if provider will up her dosage for amLODipine (NORVASC) 2.5 MG tablet to 10mg  or more as she took 16mg  other day and it worked for her. Please f/u with patient         Called pt - left message on machine. Unable to contact pt after 3 attempts. Will forward chart to clinic for follow up.

## 2022-08-28 NOTE — Telephone Encounter (Signed)
error 

## 2022-08-28 NOTE — Telephone Encounter (Signed)
Summary: dosage increase   Patient called said she is under a lot of stress and she wants to see if provider will up her dosage for amLODipine (NORVASC) 2.5 MG tablet to 10mg  or more as she took 16mg  other day and it worked for her. Please f/u with patient  ----- Message from Elon Jester sent at 08/28/2022  3:09 PM EDT ----- Patient called said she is under a lot of stress and she wants to see if provider will up her dosage for amLODipine (NORVASC) 2.5 MG tablet to 10mg  or more as she took 16mg  other day and it worked for her. Please f/u with patient      Called pt - left message on machine to return our call.

## 2022-08-29 DIAGNOSIS — Z96642 Presence of left artificial hip joint: Secondary | ICD-10-CM | POA: Diagnosis not present

## 2022-08-29 MED ORDER — AMLODIPINE BESYLATE 2.5 MG PO TABS
2.5000 mg | ORAL_TABLET | Freq: Every day | ORAL | 0 refills | Status: DC
  Filled 2023-05-24: qty 30, 30d supply, fill #0

## 2022-08-29 NOTE — Telephone Encounter (Signed)
Have sent prescription for 10mg  amlodipine to take prn. 10mg  is the maximum daily dosage, do no take more the 1 tablet in a day.  Lisinopril does not work as a prn medication

## 2022-08-29 NOTE — Telephone Encounter (Signed)
Pt. Calling back about triage from yesterday. Instructed that this has been sent to her PCP. Verbalizes understanding.

## 2022-08-29 NOTE — Addendum Note (Signed)
Addended by: Malva Limes on: 08/29/2022 02:21 PM   Modules accepted: Orders

## 2022-08-29 NOTE — Telephone Encounter (Signed)
Detailed vm left per DPR. CRM created. Ok for [facility] to advise if patient returns call.

## 2022-09-04 DIAGNOSIS — M170 Bilateral primary osteoarthritis of knee: Secondary | ICD-10-CM | POA: Diagnosis not present

## 2022-10-10 ENCOUNTER — Ambulatory Visit: Payer: Self-pay | Admitting: *Deleted

## 2022-10-10 NOTE — Telephone Encounter (Signed)
mmary: Pt would like to discuss fluctuation in blood pressure.   Pt requests a call back to discuss fluctuation in blood pressure. Pt has an appt scheduled for 10/27/22. Pt wants to know if there is something she can take to help. Cb# [phone]      Chief Complaint: Hypertension Symptoms: BP elevated Saturday, 202/95.States took PRN atorvastatin then  ASA 81mg , down to 120/65. Frequency: saturday Pertinent Negatives: Patient denies Headache, any other associated symptoms.  Disposition: [] ED /[] Urgent Care (no appt availability in office) / [x] Appointment(In office/virtual)/ []  [place] [facility]/ [] Home Care/ [] Refused Recommended Disposition /[]  Mobile Bus/ []  Follow-up with PCP Additional Notes: Pt states BP keeps fluctuating. Cannot give any other values as she is not at home. Appt secured for 10/27/22 and placed on waitlist. Care advise provided, pt verbalizes understanding.  Pt questioning if there is another BP med she can try prior to appt.  Please advise Reason for Disposition  [1] Systolic BP  >= 130 OR Diastolic >= 80 AND [2] taking BP medications  Answer Assessment - Initial Assessment Questions 1. BLOOD PRESSURE: "What is the blood pressure?" "Did you take at least two measurements 5 minutes apart?"     Saturday 202/95 2. ONSET: "When did you take your blood pressure?"     SAT 3. HOW: "How did you take your blood pressure?" (e.g., automatic home BP monitor, visiting nurse)    Home monitor 4. HISTORY: "Do you have a history of high blood pressure?"     Yes 5. MEDICINES: "Are you taking any medicines for blood pressure?" "Have you missed any doses recently?"     No Takes PRN 6. OTHER SYMPTOMS: "Do you have any symptoms?" (e.g., blurred vision, chest pain, difficulty breathing, headache, weakness)     During episode, "Couldn't think."  Protocols used: Blood Pressure - High-A-AH

## 2022-10-13 ENCOUNTER — Other Ambulatory Visit: Payer: Self-pay | Admitting: Family Medicine

## 2022-10-13 ENCOUNTER — Ambulatory Visit (INDEPENDENT_AMBULATORY_CARE_PROVIDER_SITE_OTHER): Payer: Medicare HMO | Admitting: Family Medicine

## 2022-10-13 DIAGNOSIS — I10 Essential (primary) hypertension: Secondary | ICD-10-CM

## 2022-10-13 DIAGNOSIS — R0989 Other specified symptoms and signs involving the circulatory and respiratory systems: Secondary | ICD-10-CM

## 2022-10-19 DIAGNOSIS — I499 Cardiac arrhythmia, unspecified: Secondary | ICD-10-CM | POA: Diagnosis not present

## 2022-10-19 DIAGNOSIS — R0989 Other specified symptoms and signs involving the circulatory and respiratory systems: Secondary | ICD-10-CM | POA: Diagnosis not present

## 2022-10-19 DIAGNOSIS — R410 Disorientation, unspecified: Secondary | ICD-10-CM | POA: Diagnosis not present

## 2022-10-19 DIAGNOSIS — I10 Essential (primary) hypertension: Secondary | ICD-10-CM | POA: Diagnosis not present

## 2022-10-19 DIAGNOSIS — Z87891 Personal history of nicotine dependence: Secondary | ICD-10-CM | POA: Diagnosis not present

## 2022-10-20 DIAGNOSIS — Z87891 Personal history of nicotine dependence: Secondary | ICD-10-CM | POA: Diagnosis not present

## 2022-10-20 DIAGNOSIS — R410 Disorientation, unspecified: Secondary | ICD-10-CM | POA: Diagnosis not present

## 2022-10-20 DIAGNOSIS — R0989 Other specified symptoms and signs involving the circulatory and respiratory systems: Secondary | ICD-10-CM | POA: Diagnosis not present

## 2022-10-20 DIAGNOSIS — I10 Essential (primary) hypertension: Secondary | ICD-10-CM | POA: Diagnosis not present

## 2022-10-23 NOTE — Progress Notes (Signed)
Appt cancelled. Patient just wanted referral for hypertension for which she has already been seen.

## 2022-10-24 DIAGNOSIS — I499 Cardiac arrhythmia, unspecified: Secondary | ICD-10-CM | POA: Diagnosis not present

## 2022-10-27 ENCOUNTER — Ambulatory Visit: Payer: Medicare HMO | Admitting: Family Medicine

## 2022-10-31 ENCOUNTER — Other Ambulatory Visit: Payer: Self-pay | Admitting: Family Medicine

## 2022-10-31 DIAGNOSIS — E785 Hyperlipidemia, unspecified: Secondary | ICD-10-CM

## 2022-11-02 DIAGNOSIS — R001 Bradycardia, unspecified: Secondary | ICD-10-CM | POA: Diagnosis not present

## 2022-11-02 DIAGNOSIS — R0989 Other specified symptoms and signs involving the circulatory and respiratory systems: Secondary | ICD-10-CM | POA: Diagnosis not present

## 2022-11-02 DIAGNOSIS — R410 Disorientation, unspecified: Secondary | ICD-10-CM | POA: Diagnosis not present

## 2022-11-02 NOTE — Telephone Encounter (Signed)
Requested Prescriptions  Pending Prescriptions Disp Refills   ezetimibe (ZETIA) 10 MG tablet [Pharmacy Med Name: EZETIMIBE 10 MG TAB] 90 tablet 2    Sig: TAKE ONE TABLET EVERY DAY     Cardiovascular:  Antilipid - Sterol Transport Inhibitors Failed - 10/31/2022 11:01 AM      Failed - Lipid Panel in normal range within the last 12 months    Cholesterol, Total  Date Value Ref Range Status  05/17/2022 215 (H) 100 - 199 mg/dL Final   LDL Cholesterol (Calc)  Date Value Ref Range Status  12/27/2016 180 (H) mg/dL (calc) Final    Comment:    Reference range: <100 . Desirable range <100 mg/dL for primary prevention;   <70 mg/dL for patients with CHD or diabetic patients  with > or = 2 CHD risk factors. Marland Kitchen LDL-C is now calculated using the Martin-Hopkins  calculation, which is a validated novel method providing  better accuracy than the Friedewald equation in the  estimation of LDL-C.  Horald Pollen et al. Lenox Ahr. 2952;841(32): 2061-2068  (http://education.[employer].com/faq/FAQ164)    LDL Chol Calc (NIH)  Date Value Ref Range Status  05/17/2022 126 (H) 0 - 99 mg/dL Final   Direct LDL  Date Value Ref Range Status  10/24/2010 159.3 mg/dL Final    Comment:    Optimal:  <100 mg/dLNear or Above Optimal:  100-129 mg/dLBorderline High:  130-159 mg/dLHigh:  160-189 mg/dLVery High:  >190 mg/dL   HDL  Date Value Ref Range Status  05/17/2022 75 >39 mg/dL Final   Triglycerides  Date Value Ref Range Status  05/17/2022 80 0 - 149 mg/dL Final         Passed - AST in normal range and within 360 days    AST  Date Value Ref Range Status  07/10/2022 19 15 - 41 U/L Final         Passed - ALT in normal range and within 360 days    ALT  Date Value Ref Range Status  07/10/2022 19 0 - 44 U/L Final         Passed - Patient is not pregnant      Passed - Valid encounter within last 12 months    Recent Outpatient Visits           2 weeks ago Primary hypertension   [place] [facility] Malva Limes, MD   4 months ago Primary hypertension   [facility] [facility] Malva Limes, MD   4 months ago Labile blood pressure   [place] [facility] Malva Limes, MD   5 months ago Primary hypertension    [facility] Malva Limes, MD   5 months ago Anxiety   [facility] Malva Limes, MD

## 2022-11-06 ENCOUNTER — Other Ambulatory Visit: Payer: Self-pay | Admitting: Family Medicine

## 2022-11-06 DIAGNOSIS — Z1231 Encounter for screening mammogram for malignant neoplasm of breast: Secondary | ICD-10-CM

## 2022-11-10 ENCOUNTER — Ambulatory Visit
Admission: RE | Admit: 2022-11-10 | Discharge: 2022-11-10 | Disposition: A | Discharge status: Home / Self Care | Payer: Medicare HMO | Source: Ambulatory Visit | Attending: Family Medicine | Admitting: Family Medicine

## 2022-11-10 DIAGNOSIS — Z1231 Encounter for screening mammogram for malignant neoplasm of breast: Secondary | ICD-10-CM | POA: Diagnosis not present

## 2022-11-15 DIAGNOSIS — E785 Hyperlipidemia, unspecified: Secondary | ICD-10-CM | POA: Diagnosis not present

## 2022-11-15 DIAGNOSIS — R0989 Other specified symptoms and signs involving the circulatory and respiratory systems: Secondary | ICD-10-CM | POA: Diagnosis not present

## 2022-11-22 DIAGNOSIS — M810 Age-related osteoporosis without current pathological fracture: Secondary | ICD-10-CM | POA: Diagnosis not present

## 2022-11-22 DIAGNOSIS — R0989 Other specified symptoms and signs involving the circulatory and respiratory systems: Secondary | ICD-10-CM | POA: Diagnosis not present

## 2022-12-05 DIAGNOSIS — R0989 Other specified symptoms and signs involving the circulatory and respiratory systems: Secondary | ICD-10-CM | POA: Diagnosis not present

## 2022-12-12 DIAGNOSIS — M1612 Unilateral primary osteoarthritis, left hip: Secondary | ICD-10-CM | POA: Diagnosis not present

## 2022-12-12 DIAGNOSIS — Z96642 Presence of left artificial hip joint: Secondary | ICD-10-CM | POA: Diagnosis not present

## 2022-12-15 DIAGNOSIS — D225 Melanocytic nevi of trunk: Secondary | ICD-10-CM | POA: Diagnosis not present

## 2022-12-15 DIAGNOSIS — L821 Other seborrheic keratosis: Secondary | ICD-10-CM | POA: Diagnosis not present

## 2022-12-15 DIAGNOSIS — D2272 Melanocytic nevi of left lower limb, including hip: Secondary | ICD-10-CM | POA: Diagnosis not present

## 2022-12-15 DIAGNOSIS — D2262 Melanocytic nevi of left upper limb, including shoulder: Secondary | ICD-10-CM | POA: Diagnosis not present

## 2022-12-15 DIAGNOSIS — D2271 Melanocytic nevi of right lower limb, including hip: Secondary | ICD-10-CM | POA: Diagnosis not present

## 2022-12-15 DIAGNOSIS — D2261 Melanocytic nevi of right upper limb, including shoulder: Secondary | ICD-10-CM | POA: Diagnosis not present

## 2023-01-03 DIAGNOSIS — M810 Age-related osteoporosis without current pathological fracture: Secondary | ICD-10-CM | POA: Diagnosis not present

## 2023-01-03 DIAGNOSIS — E039 Hypothyroidism, unspecified: Secondary | ICD-10-CM | POA: Diagnosis not present

## 2023-01-11 DIAGNOSIS — Z96642 Presence of left artificial hip joint: Secondary | ICD-10-CM | POA: Diagnosis not present

## 2023-01-11 DIAGNOSIS — M1711 Unilateral primary osteoarthritis, right knee: Secondary | ICD-10-CM | POA: Diagnosis not present

## 2023-01-11 DIAGNOSIS — R0989 Other specified symptoms and signs involving the circulatory and respiratory systems: Secondary | ICD-10-CM | POA: Diagnosis not present

## 2023-01-11 DIAGNOSIS — M1612 Unilateral primary osteoarthritis, left hip: Secondary | ICD-10-CM | POA: Diagnosis not present

## 2023-01-15 ENCOUNTER — Ambulatory Visit (INDEPENDENT_AMBULATORY_CARE_PROVIDER_SITE_OTHER): Payer: Medicare HMO | Admitting: Family Medicine

## 2023-01-15 ENCOUNTER — Encounter: Payer: Self-pay | Admitting: Family Medicine

## 2023-01-15 VITALS — BP 139/63 | HR 62 | Resp 14 | Ht 63.5 in | Wt 135.5 lb

## 2023-01-15 DIAGNOSIS — I10 Essential (primary) hypertension: Secondary | ICD-10-CM

## 2023-01-15 DIAGNOSIS — J302 Other seasonal allergic rhinitis: Secondary | ICD-10-CM

## 2023-01-15 DIAGNOSIS — Z860101 Personal history of adenomatous and serrated colon polyps: Secondary | ICD-10-CM

## 2023-01-15 DIAGNOSIS — F419 Anxiety disorder, unspecified: Secondary | ICD-10-CM

## 2023-01-15 MED ORDER — IPRATROPIUM BROMIDE 0.03 % NA SOLN
2.0000 | Freq: Two times a day (BID) | NASAL | 3 refills | Status: AC
Start: 2023-01-15 — End: ?
  Filled 2023-05-24: qty 30, 75d supply, fill #0

## 2023-01-15 MED ORDER — BUPROPION HCL ER (SR) 150 MG PO TB12
150.0000 mg | ORAL_TABLET | Freq: Two times a day (BID) | ORAL | 3 refills | Status: AC
Start: 2023-01-15 — End: ?
  Filled 2023-05-24 – 2023-10-17 (×3): qty 180, 90d supply, fill #0

## 2023-01-15 NOTE — Assessment & Plan Note (Addendum)
Increased symptoms since moving to a new apartment, possibly due to dust. No improvement with filter change in HVAC system. Currently using ipratropium nasal spray for symptomatic relief. -Consider purchasing a HEPA air purifier for the apartment -Start claritin (loratadine) daily -Start ipratropium nasal spray as needed.

## 2023-01-15 NOTE — Progress Notes (Signed)
Established patient visit   Patient: Laura Love   DOB: [birth year]   82 y.o. Female  MRN: [id number] Visit Date: 01/15/2023  Today's healthcare provider: Sherlyn Hay, DO   Chief Complaint  Patient presents with   Nasal Congestion    Its her yearly thing. Thinks it allergies, dust, etc. Asking for a prescription of nasal spray-ipratropium bromide. She has no other symptoms.    Subjective    HPI The patient, with a history of hypertension and polyps, presents for a medication refill and to discuss new symptoms of a persistent runny nose. She reports that her symptoms started after moving into a new apartment six months ago, which she believes has a higher dust level. The patient has tried using fluticasone nasal spray, but it has not alleviated the symptoms.  A friend recommended ipratropium nasal spray, which she is requesting today.  She has also noticed that her blood pressure has been high recently, but she attributes this to caffeine and stress. She has since stopped consuming caffeine and reports that her blood pressure has improved.  The patient also reports that she has been more active recently, attending exercise classes and walking more due to the layout of her new building. She has noticed that her blood pressure is lower in the mornings, around 105/55, and increases as she becomes more active throughout the day. She has been monitoring her blood pressure at home.  The patient has a history of polyps and is due for a repeat colonoscopy. She had a significant number of polyps removed eight years ago and has been advised to have regular screenings. She is also due for a tetanus vaccine and is considering getting the COVID-19 vaccine and the newer shingles vaccine.  The patient had a hip replacement five months ago and reports that she is doing well post-surgery. She is currently taking bupropion once daily, but she has it prescribed twice daily for insurance purposes.       Medications: Outpatient Medications Prior to Visit  Medication Sig   acetaminophen (TYLENOL) 325 MG tablet Take 650 mg by mouth every 6 (six) hours as needed for moderate pain.   albuterol (VENTOLIN HFA) 108 (90 Base) MCG/ACT inhaler Inhale 2 puffs into the lungs every 6 (six) hours as needed for wheezing or shortness of breath.   amLODipine (NORVASC) 10 MG tablet Take 1 tablet (10mg ) by mouth daily as needed. For Systolic BP > 160Reason for Variance: Change in therapy   Biotin 1000 MCG tablet Take 5,000 mcg by mouth daily.   CALCIUM PO Take 600 mg by mouth 2 (two) times daily. + D3 800 units   dicyclomine (BENTYL) 10 MG capsule Take 1 capsule (10 mg total) by mouth 4 (four) times daily -  before meals and at bedtime. As needed for abdominal pain   diphenhydrAMINE (BENADRYL) 25 MG tablet Take 25 mg by mouth every 6 (six) hours as needed.   diphenhydramine-acetaminophen (TYLENOL PM) 25-500 MG TABS tablet Take 1 tablet by mouth at bedtime as needed.   enoxaparin (LOVENOX) 40 MG/0.4ML injection Inject 0.4 mLs (40 mg total) into the skin daily for 13 days.   ezetimibe (ZETIA) 10 MG tablet TAKE ONE TABLET EVERY DAY   hyoscyamine (ANASPAZ) 0.125 MG TBDP disintergrating tablet Place 1 tablet (0.125 mg total) under the tongue every 4 (four) hours as needed.   meclizine (ANTIVERT) 12.5 MG tablet Take 12.5 mg by mouth 3 (three) times daily as needed for dizziness.  Melatonin 5 MG CAPS Take 1 capsule by mouth every evening.   Multiple Vitamins-Minerals (CENTRUM SILVER 50+WOMEN PO) Take 1 tablet by mouth daily.   oxyCODONE (OXY IR/ROXICODONE) 5 MG immediate release tablet Take 1 tablet (5 mg total) by mouth every 4 (four) hours as needed for moderate pain (pain score 4-6).   Sennosides 25 MG TABS Take 1 tablet by mouth daily.   sennosides-docusate sodium (SENOKOT-S) 8.6-50 MG tablet Take 1 tablet by mouth daily as needed.   sodium chloride (OCEAN) 0.65 % SOLN nasal spray Place 1 spray into both  nostrils as needed for congestion.   SYNTHROID 100 MCG tablet TAKE 1 TABLET BY MOUTH DAILY   Tetrahydroz-Polyvinyl Al-Povid (CLEAR EYES TRIPLE ACTION OP) Apply 1-2 drops to eye 4 (four) times daily as needed.   traMADol (ULTRAM) 50 MG tablet Take 1-2 tablets (50-100 mg total) by mouth every 4 (four) hours as needed for moderate pain.   vitamin C (ASCORBIC ACID) 500 MG tablet Take 500 mg by mouth daily as needed.   Vitamin D, Cholecalciferol, 1000 UNITS TABS Take 2 tablets by mouth daily.   Zinc 30 MG CAPS Take 1 capsule by mouth daily as needed.   zoledronic acid (RECLAST) 5 MG/100ML SOLN injection Inject 5 mg into the vein. Once yearly starting June 2019 per Dr. Tedd Sias   [DISCONTINUED] buPROPion [facility] SR) 150 MG 12 hr tablet TAKE ONE TABLET TWICE DAILY (Patient taking differently: 150 mg daily.)   No facility-administered medications prior to visit.    Review of Systems  Constitutional:  Negative for chills and fever.  HENT:  Positive for hearing loss (trouble discern origin of sounds) and rhinorrhea. Negative for congestion, sinus pressure, sinus pain, trouble swallowing and voice change.   Respiratory: Negative.  Negative for cough, shortness of breath and wheezing.   Cardiovascular:  Negative for chest pain, palpitations and leg swelling.  Neurological:  Negative for weakness and headaches.        Objective    BP 139/63 (BP Location: Right Arm, Patient Position: Sitting, Cuff Size: Normal)   Pulse 62   Resp 14   Ht 5' 3.5" (1.613 m)   Wt 135 lb 8 oz (61.5 kg)   SpO2 99%   BMI 23.63 kg/m     Physical Exam Vitals and nursing note reviewed.  Constitutional:      General: She is not in acute distress.    Appearance: Normal appearance.  HENT:     Head: Normocephalic and atraumatic.     Right Ear: Tympanic membrane and ear canal normal. There is no impacted cerumen.     Left Ear: Tympanic membrane and ear canal normal. There is impacted cerumen.     Nose: Rhinorrhea  present. No congestion.     Mouth/Throat:     Mouth: Mucous membranes are moist.     Pharynx: No oropharyngeal exudate or posterior oropharyngeal erythema.  Eyes:     General: No scleral icterus.    Conjunctiva/sclera: Conjunctivae normal.  Cardiovascular:     Rate and Rhythm: Normal rate.  Pulmonary:     Effort: Pulmonary effort is normal.  Neurological:     Mental Status: She is alert and oriented to person, place, and time. Mental status is at baseline.  Psychiatric:        Mood and Affect: Mood normal.        Behavior: Behavior normal.      No results found for any visits on 01/15/23.  Assessment & Plan  Seasonal allergic rhinitis, unspecified trigger Assessment & Plan: Increased symptoms since moving to a new apartment, possibly due to dust. No improvement with filter change in HVAC system. Currently using ipratropium nasal spray for symptomatic relief. -Consider purchasing a HEPA air purifier for the apartment -Start claritin (loratadine) daily -Start ipratropium nasal spray as needed.  Orders: -     Ipratropium Bromide; Place 2 sprays into both nostrils every 12 (twelve) hours.  Dispense: 30 mL; Refill: 3  Anxiety Assessment & Plan: Patient is on bupropion and needs a refill. -Send refill for bupropion to [facility].  Orders: -     buPROPion HCl ER (SR); Take 1 tablet (150 mg total) by mouth 2 (two) times daily.  Dispense: 180 tablet; Refill: 3  History of adenomatous polyp of colon -     Ambulatory referral to Gastroenterology  Primary hypertension Assessment & Plan: Blood pressure readings have been low at home, especially in the morning. Patient has been more active recently and has cut out caffeine, which seems to have helped. -Continue current management and monitor blood pressure at home.   Hearing Loss Difficulty discerning direction of sound, no recent hearing test. No ear pain, but reports dry ears. -Consider mineral oil drops for dry  ears. -Consider scheduling a hearing test.  Colon Polyps History of multiple polyps, last colonoscopy in 2019 with follow-up recommended in 5 years. Patient prefers more frequent screenings due to history. -Refer to [facility] for colonoscopy, if in-network.  General Health Maintenance / Followup Plans -Schedule annual wellness visit. -Consider water aerobics or lap swimming for exercise, with gradual increase in laps to avoid back pain.  Return in about 1 month (around 02/14/2023), or if symptoms worsen or fail to improve, for mAWV.      I discussed the assessment and treatment plan with the patient  The patient was provided an opportunity to ask questions and all were answered. The patient agreed with the plan and demonstrated an understanding of the instructions.   The patient was advised to call back or seek an in-person evaluation if the symptoms worsen or if the condition fails to improve as anticipated.    Sherlyn Hay, DO  [facility] [phone] (phone) [phone] (fax)  [facility]

## 2023-01-15 NOTE — Patient Instructions (Addendum)
Recommended vaccines: - Covid booster - Tdap (tetanus, diphtheria, and pertussis) - Shingrix (shingles) vaccine   SCHEDULE repeat colonoscopy

## 2023-01-28 DIAGNOSIS — F419 Anxiety disorder, unspecified: Secondary | ICD-10-CM | POA: Insufficient documentation

## 2023-01-28 NOTE — Assessment & Plan Note (Signed)
Patient is on bupropion and needs a refill. -Send refill for bupropion to [facility].

## 2023-01-28 NOTE — Assessment & Plan Note (Signed)
Blood pressure readings have been low at home, especially in the morning. Patient has been more active recently and has cut out caffeine, which seems to have helped. -Continue current management and monitor blood pressure at home.

## 2023-01-28 NOTE — Assessment & Plan Note (Deleted)
Patient is on bupropion and needs a refill. -Send refill for bupropion to [facility].

## 2023-02-02 ENCOUNTER — Ambulatory Visit: Payer: Self-pay

## 2023-02-02 ENCOUNTER — Encounter: Payer: Self-pay | Admitting: Family Medicine

## 2023-02-02 ENCOUNTER — Telehealth (INDEPENDENT_AMBULATORY_CARE_PROVIDER_SITE_OTHER): Payer: Medicare HMO | Admitting: Family Medicine

## 2023-02-02 DIAGNOSIS — J069 Acute upper respiratory infection, unspecified: Secondary | ICD-10-CM

## 2023-02-02 NOTE — Progress Notes (Signed)
MyChart Video Visit    Virtual Visit via Video Note   This format is felt to be most appropriate for this patient at this time. Physical exam was limited by quality of the video and audio technology used for the visit.    Patient location: home Provider location: [facility] Persons involved in the visit: patient, provider  I discussed the limitations of evaluation and management by telemedicine and the availability of in person appointments. The patient expressed understanding and agreed to proceed.  Patient: Laura Love   DOB: 1940/10/25   82 y.o. Female  MRN: [id number] Visit Date: 02/02/2023  Today's healthcare provider: Shirlee Latch, MD   No chief complaint on file.  Subjective    HPI   Discussed the use of AI scribe software for clinical note transcription with the patient, who gave verbal consent to proceed.  History of Present Illness   Laura Love, a resident of the [place], presents with symptoms of a cold or flu that began yesterday. She describes feeling "funky" and has been experiencing intermittent coughing. Despite taking Mucinex DM Max, her symptoms have worsened, leading to her feeling weak and achy. She has been resting in bed since the previous evening. She has no fever but reports feeling generally unwell. She has a history of pneumonia, which has left her with a persistent cough. She is concerned about the appropriate use of ibuprofen and Tylenol to manage her symptoms. She also mentions having a Z-Pak on hand, but is unsure if it's appropriate to use in this situation.       Review of Systems      Objective    There were no vitals taken for this visit.      Physical Exam Constitutional:      General: She is not in acute distress.    Appearance: Normal appearance.  HENT:     Head: Normocephalic.  Pulmonary:     Effort: Pulmonary effort is normal. No respiratory distress.  Neurological:     Mental  Status: She is alert and oriented to person, place, and time. Mental status is at baseline.        Assessment & Plan     Problem List Items Addressed This Visit   None       Upper Respiratory Infection (URI) Acute onset of cough, body aches, and malaise starting yesterday. No fever. Likely viral etiology given short duration and absence of fever. Differential includes common cold and COVID-19. Antibiotics not indicated. Symptoms typically last around 7 days and may worsen before improving. Discussed risks of overusing ibuprofen (gastrointestinal upset) and Tylenol (liver damage). Advised taking ibuprofen with food. Home COVID-19 test recommended; positive result would change treatment to include Paxlovid. - Alternate ibuprofen (up to 800 mg every 6 hours) and Tylenol (up to 1000 mg every 6 hours) for pain and fever management, ensuring not to exceed maximum dosage - Continue Mucinex DM Max for cough and sputum management. - Perform home COVID-19 test. If positive, contact clinic for further instructions and potential Paxlovid prescription. - Monitor symptoms; expect improvement within 7 days. Follow up if symptoms worsen or do not improve.        Return if symptoms worsen or fail to improve.     I discussed the assessment and treatment plan with the patient. The patient was provided an opportunity to ask questions and all were answered. The patient agreed with the plan and demonstrated an understanding of the instructions.  The patient was advised to call back or seek an in-person evaluation if the symptoms worsen or if the condition fails to improve as anticipated.   Shirlee Latch, MD [facility] [phone] (phone) [phone] (fax)  [facility]

## 2023-02-02 NOTE — Telephone Encounter (Signed)
  Chief Complaint: URI - weakness, feels poorly, sinus congestion Symptoms: above Frequency: yesterday Pertinent Negatives: Patient denies fever Disposition: [] ED /[] Urgent Care (no appt availability in office) / [x] Appointment(In office/virtual)/ []  [facility]/ [] Home Care/ [] Refused Recommended Disposition /[] [facility]/ []  Follow-up with PCP Additional Notes: Pt was around other who were very sick on Sunday, and took food home with her. Yesterday pt started feeling poorly, and today is weak. She has sinus congestion. Pt is taking OTC medications at this time. VV made for this morning to help halt progression.    Summary: Medication questions, symptomatic.   Pt called and wants to speak to a nurse regarding her cold/flu/possible virus symptoms. Has questions about medication.  Best contact: [phone]       Reason for Disposition  Common cold with no complications  Answer Assessment - Initial Assessment Questions 1. ONSET: "When did the nasal discharge start?"      yesterday  3. COUGH: "Do you have a cough?" If Yes, ask: "Describe the color of your sputum" (clear, white, yellow, green)     yes  5. FEVER: "Do you have a fever?" If Yes, ask: "What is your temperature, how was it measured, and when did it start?"     no 6. SEVERITY: "Overall, how bad are you feeling right now?" (e.g., doesn't interfere with normal activities, staying home from school/work, staying in bed)      Feels terrible - weakness 7. OTHER SYMPTOMS: "Do you have any other symptoms?" (e.g., sore throat, earache, wheezing, vomiting)     Sinus congestion  Protocols used: Common Cold-A-AH

## 2023-02-14 ENCOUNTER — Ambulatory Visit (INDEPENDENT_AMBULATORY_CARE_PROVIDER_SITE_OTHER): Payer: Medicare HMO | Admitting: Family Medicine

## 2023-02-14 ENCOUNTER — Encounter: Payer: Self-pay | Admitting: Family Medicine

## 2023-02-14 VITALS — BP 149/62 | HR 98 | Temp 99.0°F | Ht 61.0 in | Wt 136.2 lb

## 2023-02-14 DIAGNOSIS — R058 Other specified cough: Secondary | ICD-10-CM | POA: Insufficient documentation

## 2023-02-14 DIAGNOSIS — J019 Acute sinusitis, unspecified: Secondary | ICD-10-CM

## 2023-02-14 DIAGNOSIS — I10 Essential (primary) hypertension: Secondary | ICD-10-CM | POA: Diagnosis not present

## 2023-02-14 MED ORDER — AMOXICILLIN-POT CLAVULANATE 875-125 MG PO TABS: 1.0000 | ORAL_TABLET | Freq: Two times a day (BID) | ORAL | 0 refills | Status: DC

## 2023-02-14 MED ORDER — GUAIFENESIN 200 MG PO TABS: 200.0000 mg | ORAL_TABLET | ORAL | 0 refills | Status: DC | PRN

## 2023-02-14 MED ORDER — AZELASTINE HCL 0.1 % NA SOLN: 1.0000 | Freq: Two times a day (BID) | NASAL | 0 refills | Status: AC | PRN

## 2023-02-14 NOTE — Progress Notes (Signed)
Acute Office Visit  Introduced to nurse practitioner role and practice setting.  All questions answered.  Discussed provider/patient relationship and expectations.   Subjective:     Patient ID: Laura Love, female    DOB: 06-23-1940, 82 y.o.   MRN: [id number]  Chief Complaint  Patient presents with   Cough    Productive cough most of the time associated with congestion. Patient reports that her symptoms have been present since Thanksgiving. Patient reports things are getting better but would like to make sure nothing concerning as she has a hx of pneumonia. Pt reports seeing someone in the [place] clinic and patient was advised they might have heard a little something in her left lung and was tested for covid. Test was negative. Patient reports to be taking coricidin bp cough and cold, sometimes mucinex dm    Pt presents with concern for developing pneumonia. She states she got sick after Thanksgiving, her granddaughter had a cold and pt got sick after, was "pretty sick" for six days, but since then she is still having congestion, cough, and she feels like its starting to get in her lungs. Coughing several times per day, that can last a few minutes. She was taking Mucinex DM last week, but this week she is taking chlorpheniramine-dextromethorphan for cold/cough symptoms. Concerns because living a senior living center and does not want to worsen. Historically when she has had these symptom sit had developed into pneumonia.  Cough Pertinent negatives include no chest pain, chills, ear pain, eye redness, fever, rash, shortness of breath, weight loss or wheezing.    Review of Systems  Constitutional:  Negative for chills, diaphoresis, fever, malaise/fatigue and weight loss.  HENT:  Positive for congestion and sinus pain. Negative for ear discharge and ear pain.        Mild sinus pain  Eyes:  Negative for discharge and redness.  Respiratory:  Positive for cough and sputum production.  Negative for shortness of breath and wheezing.   Cardiovascular:  Negative for chest pain and palpitations.  Gastrointestinal:  Negative for abdominal pain, constipation, diarrhea, nausea and vomiting.  Skin:  Negative for rash.      Objective:    BP (!) 149/62   Pulse 98   Temp 99 F (37.2 C) (Oral)   Ht 5\' 1"  (1.549 m)   Wt 136 lb 3.2 oz (61.8 kg)   SpO2 (!) 62%   BMI 25.73 kg/m    Physical Exam Vitals reviewed.  Constitutional:      Appearance: Normal appearance. She is normal weight.  HENT:     Head: Normocephalic.     Ears:     Comments: Dry bilateral ear canals and narrow, otherwise TM pearly, non-bulging, no purulent effusion, no injection.    Nose: Congestion and rhinorrhea present.     Right Turbinates: Not swollen.     Left Turbinates: Not swollen.     Comments: Mild tenderness to sinuses, but not overly bothersome to pt.     Mouth/Throat:     Mouth: Mucous membranes are moist.     Pharynx: No oropharyngeal exudate or posterior oropharyngeal erythema.  Eyes:     Extraocular Movements: Extraocular movements intact.     Conjunctiva/sclera: Conjunctivae normal.     Pupils: Pupils are equal, round, and reactive to light.  Cardiovascular:     Rate and Rhythm: Normal rate and regular rhythm.     Pulses: Normal pulses.     Heart sounds: Normal heart sounds.  Pulmonary:     Effort: Pulmonary effort is normal. No respiratory distress.     Breath sounds: Normal breath sounds. No stridor. No wheezing, rhonchi or rales.  Chest:     Chest wall: No tenderness.  Musculoskeletal:        General: Normal range of motion.     Cervical back: Normal range of motion. No tenderness.  Lymphadenopathy:     Cervical: No cervical adenopathy.  Skin:    General: Skin is warm and dry.     Capillary Refill: Capillary refill takes less than 2 seconds.  Neurological:     General: No focal deficit present.     Mental Status: She is alert and oriented to person, place, and time.  Mental status is at baseline.     No results found for any visits on 02/14/23.      Assessment & Plan:   Problem List Items Addressed This Visit       Cardiovascular and Mediastinum   Primary hypertension   Elevated BP in office, pt is here for acute visit due to upper respiratory infection.  -Please monitor at home, continue current regimen, if continues to be SBP >130 and DBP>90 - may need medication adjustment.  - Decrease caffeine, increase water intake, DASH diet, daily walks.         Respiratory   Subacute sinusitis - Primary   Pt presents with 3 weeks of productive cough and congestion post viral infection. Concerns for risk of pneumonia, and recent increase in congestion symptoms. Appears pt is at start of sinusitis sinus infection given time course of symptoms. Lung sounds were clear, no rhonchi, stridor, or wheezing, less likely pneumonia as this time. CURB-65 = 1, low risk. Given living in a highly populated senior living facility. Will do five day course of Augmentin twice a day.   Azelastine nasal spray for congestion relief Continue to use nasal saline spray Humidification Wear mask at facility Deep breathing exercise to help prevent pneumonia, continue normal activities.  Pt has completed pneumonia vaccine.  If symptoms worsen, if you are unable to do daily tasks, or have increased work of breathing, present to clinic or ED for further mgmt.        Relevant Medications   azelastine (ASTELIN) 0.1 % nasal spray   guaiFENesin 200 MG tablet   amoxicillin-clavulanate (AUGMENTIN) 875-125 MG tablet     Other   Cough with sputum   Productive cough - continue using chlorpheniramine- dextromethorphan - Mucinex 200mg  tablet as needed every four hours - Hot tea with honey - throat lozenges - increase fluids       Relevant Medications   guaiFENesin 200 MG tablet      Meds ordered this encounter  Medications   azelastine (ASTELIN) 0.1 % nasal spray     Sig: Place 1 spray into both nostrils 2 (two) times daily as needed for rhinitis. Use in each nostril as directed    Dispense:  30 mL    Refill:  0   guaiFENesin 200 MG tablet    Sig: Take 1 tablet (200 mg total) by mouth every 4 (four) hours as needed for cough or to loosen phlegm.    Dispense:  30 suppository    Refill:  0   amoxicillin-clavulanate (AUGMENTIN) 875-125 MG tablet    Sig: Take 1 tablet by mouth 2 (two) times daily.    Dispense:  10 tablet    Refill:  0   May get COVID vaccine once  symptoms subside.  Return if symptoms worsen or fail to improve.  I, Sallee Provencal, FNP, have reviewed all documentation for this visit. The documentation on 02/14/23 for the exam, diagnosis, procedures, and orders are all accurate and complete.   Sallee Provencal, FNP

## 2023-02-14 NOTE — Assessment & Plan Note (Signed)
Productive cough - continue using chlorpheniramine- dextromethorphan - Mucinex 200mg  tablet as needed every four hours - Hot tea with honey - throat lozenges - increase fluids

## 2023-02-14 NOTE — Assessment & Plan Note (Addendum)
Pt presents with 3 weeks of productive cough and congestion post viral infection. Concerns for risk of pneumonia, and recent increase in congestion symptoms. Appears pt is at start of sinusitis sinus infection given time course of symptoms. Lung sounds were clear, no rhonchi, stridor, or wheezing, less likely pneumonia as this time. CURB-65 = 1, low risk. Given living in a highly populated senior living facility. Will do five day course of Augmentin twice a day.   Azelastine nasal spray for congestion relief Continue to use nasal saline spray Humidification Wear mask at facility Deep breathing exercise to help prevent pneumonia, continue normal activities.  Pt has completed pneumonia vaccine.  If symptoms worsen, if you are unable to do daily tasks, or have increased work of breathing, present to clinic or ED for further mgmt.

## 2023-02-14 NOTE — Assessment & Plan Note (Signed)
Elevated BP in office, pt is here for acute visit due to upper respiratory infection.  -Please monitor at home, continue current regimen, if continues to be SBP >130 and DBP>90 - may need medication adjustment.  - Decrease caffeine, increase water intake, DASH diet, daily walks.

## 2023-04-06 ENCOUNTER — Encounter: Payer: Self-pay | Admitting: Family Medicine

## 2023-04-06 ENCOUNTER — Ambulatory Visit (INDEPENDENT_AMBULATORY_CARE_PROVIDER_SITE_OTHER): Payer: PPO | Admitting: Family Medicine

## 2023-04-06 VITALS — BP 127/73 | HR 73 | Resp 16 | Wt 138.0 lb

## 2023-04-06 DIAGNOSIS — Z860101 Personal history of adenomatous and serrated colon polyps: Secondary | ICD-10-CM

## 2023-04-06 DIAGNOSIS — R591 Generalized enlarged lymph nodes: Secondary | ICD-10-CM | POA: Diagnosis not present

## 2023-04-06 DIAGNOSIS — R052 Subacute cough: Secondary | ICD-10-CM

## 2023-04-06 DIAGNOSIS — R09A2 Foreign body sensation, throat: Secondary | ICD-10-CM | POA: Diagnosis not present

## 2023-04-06 DIAGNOSIS — L219 Seborrheic dermatitis, unspecified: Secondary | ICD-10-CM

## 2023-04-06 MED ORDER — NEOMYCIN-POLYMYXIN-HC 3.5-10000-1 OT SOLN
OTIC | 1 refills | Status: AC
  Filled 2023-05-24: qty 10, 17d supply, fill #0

## 2023-04-06 NOTE — Progress Notes (Signed)
 Established patient visit   Patient: Laura Love   DOB: [birth year]   83 y.o. Female  MRN: [id number] Visit Date: 04/06/2023  Today's healthcare provider: Nancyann Perry, MD   Chief Complaint  Patient presents with   Cough    Patient reports cough overall is better. She just gets a tickle in throat that makes her cough non stop sometimes. Reports she had a dental appointment yesterday and took 2 pills of zpak.   Subjective    Discussed the use of AI scribe software for clinical note transcription with the patient, who gave verbal consent to proceed.  History of Present Illness   Laura Love is an 83 year old female who presents with a persistent cough following a recent sinus infection. she was treated for sinusitus with augmentin  in December, but cough has still not resolved. The cough is sudden and often triggered by eating or talking, accompanied by sneezing and rhinorrhea. It is described as a 'tickle' in the throat that leads to frequent coughing fits and is embarrassing due to its 'horrible' sound. She recalls a similar experience after having pneumonia many years ago. The sensation in her throat is more regular now compared to before the cold. No dysphagia or sensation of food impaction. Her nose is not congested, and the rhinorrhea is not more than usual.  She also reports ear itching, which she attributes to dryness, and uses Vaseline to alleviate the symptoms. Both ears are affected equally, with itching primarily inside the ear canal.  She also requests referral to GI for follow up multiple polyps. she had numerous adenomatous polyps on colonoscopy done at [facility] in 2015, and several polyps on every subsesequent colonoscopy done in 2016, 2017, and 2019.  She is concerned about the need for regular follow-up due to the potential for recurrence and has had difficulty finding a satisfactory provider for her colonoscopy.  She has a difficult navigating the [facility] system  and prefers to see a local gastroenterology.   She mentions a change in her coffee consumption, switching to decaf after realizing that regular coffee was affecting her blood pressure and causing stomach issues. She has noticed an improvement in her blood pressure since making this change.       Medications: Outpatient Medications Prior to Visit  Medication Sig   acetaminophen  (TYLENOL ) 325 MG tablet Take 650 mg by mouth every 6 (six) hours as needed for moderate pain.   albuterol  (VENTOLIN  HFA) 108 (90 Base) MCG/ACT inhaler Inhale 2 puffs into the lungs every 6 (six) hours as needed for wheezing or shortness of breath.   amLODipine  (NORVASC ) 10 MG tablet Take 1 tablet (10mg ) by mouth daily as needed. For Systolic BP > 160Reason for Variance: Change in therapy   azelastine  (ASTELIN ) 0.1 % nasal spray Place 1 spray into both nostrils 2 (two) times daily as needed for rhinitis. Use in each nostril as directed   Biotin 1000 MCG tablet Take 5,000 mcg by mouth daily.   buPROPion  (WELLBUTRIN  SR) 150 MG 12 hr tablet Take 1 tablet (150 mg total) by mouth 2 (two) times daily.   CALCIUM PO Take 600 mg by mouth 2 (two) times daily. + D3 800 units   dicyclomine  (BENTYL ) 10 MG capsule Take 1 capsule (10 mg total) by mouth 4 (four) times daily -  before meals and at bedtime. As needed for abdominal pain   diphenhydrAMINE  (BENADRYL ) 25 MG tablet Take 25 mg by mouth every  6 (six) hours as needed.   diphenhydramine -acetaminophen  (TYLENOL  PM) 25-500 MG TABS tablet Take 1 tablet by mouth at bedtime as needed.   ezetimibe  (ZETIA ) 10 MG tablet TAKE ONE TABLET EVERY DAY   guaiFENesin  200 MG tablet Take 1 tablet (200 mg total) by mouth every 4 (four) hours as needed for cough or to loosen phlegm.   hyoscyamine  (ANASPAZ ) 0.125 MG TBDP disintergrating tablet Place 1 tablet (0.125 mg total) under the tongue every 4 (four) hours as needed.   ipratropium (ATROVENT ) 0.03 % nasal spray Place 2 sprays into both nostrils  every 12 (twelve) hours.   meclizine  (ANTIVERT ) 12.5 MG tablet Take 12.5 mg by mouth 3 (three) times daily as needed for dizziness.   Melatonin 5 MG CAPS Take 1 capsule by mouth every evening.   Multiple Vitamins-Minerals (CENTRUM SILVER 50+WOMEN PO) Take 1 tablet by mouth daily.   oxyCODONE  (OXY IR/ROXICODONE ) 5 MG immediate release tablet Take 1 tablet (5 mg total) by mouth every 4 (four) hours as needed for moderate pain (pain score 4-6).   Sennosides 25 MG TABS Take 1 tablet by mouth daily.   sennosides-docusate sodium  (SENOKOT-S) 8.6-50 MG tablet Take 1 tablet by mouth daily as needed.   sodium chloride  (OCEAN) 0.65 % SOLN nasal spray Place 1 spray into both nostrils as needed for congestion.   SYNTHROID  100 MCG tablet TAKE 1 TABLET BY MOUTH DAILY   Tetrahydroz-Polyvinyl Al-Povid (CLEAR EYES TRIPLE ACTION OP) Apply 1-2 drops to eye 4 (four) times daily as needed.   traMADol  (ULTRAM ) 50 MG tablet Take 1-2 tablets (50-100 mg total) by mouth every 4 (four) hours as needed for moderate pain.   vitamin C (ASCORBIC ACID) 500 MG tablet Take 500 mg by mouth daily as needed.   Vitamin D , Cholecalciferol, 1000 UNITS TABS Take 2 tablets by mouth daily.   Zinc 30 MG CAPS Take 1 capsule by mouth daily as needed.   zoledronic  acid (RECLAST ) 5 MG/100ML SOLN injection Inject 5 mg into the vein. Once yearly starting June 2019 per Dr. Solum   enoxaparin  (LOVENOX ) 40 MG/0.4ML injection Inject 0.4 mLs (40 mg total) into the skin daily for 13 days.   No facility-administered medications prior to visit.   Review of Systems  Respiratory:  Positive for cough. Negative for shortness of breath and wheezing.   Cardiovascular:  Negative for chest pain, palpitations and leg swelling.  Neurological:  Negative for weakness and headaches.      Objective    BP 127/73 (BP Location: Left Arm, Patient Position: Sitting, Cuff Size: Small)   Pulse 73   Resp 16   Wt 138 lb (62.6 kg)   SpO2 96%   BMI 26.07 kg/m     Physical Exam   HEENT: Bilateral ear canals with wax buildup, not occluded. Diagnosis of seborrhea in the ear canals. Tenderness on palpation of lymph nodes, left side more pronounced. Nasal cavity without abnormalities. Oral pharynx without erythema or exudate.      Assessment & Plan       Post-infectious cough associated with globus sensation and slight right submandibular adenopathy. Persistent cough following a recent cold. No dysphagia or nasal congestion. Lymph nodes palpable but symmetrical. -Consider ENT referral if not resolved by the end of the month.  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 -Prescribe ear drops for periodic use.  Colonoscopy follow-up significantly increase risk of developing colon cancer due to finding of numerous adenomatous  polyps in 2015 and additional adenomatous polyps removed in 2016, 2017 and 2019.  Considering she is otherwise in very good health she would like to continue screenings. -Referral to [facility] for colonoscopy.  Eye examination No current eye complaints, but no eye examination in the past four years. -Recommend routine eye examination at [facility].  Hypertension Improved since reducing caffeine intake. -Continue current management.         Nancyann Perry, MD  [facility] [phone] (phone) [phone] (fax)  [facility]

## 2023-04-17 ENCOUNTER — Ambulatory Visit (INDEPENDENT_AMBULATORY_CARE_PROVIDER_SITE_OTHER): Payer: PPO | Admitting: Family Medicine

## 2023-04-17 ENCOUNTER — Encounter: Payer: Self-pay | Admitting: Family Medicine

## 2023-04-17 VITALS — BP 152/73 | HR 68 | Ht 61.0 in | Wt 139.2 lb

## 2023-04-17 DIAGNOSIS — M79675 Pain in left toe(s): Secondary | ICD-10-CM

## 2023-04-17 NOTE — Progress Notes (Signed)
 Acute visit   Patient: Laura Love   DOB: 04-20-1940   83 y.o. Female  MRN: [id number]  Chief Complaint  Patient presents with   Foot Pain    Left foot pain X 2 weeks. No swelling in feet noticed. Reports due to incident with trying on new sandals.    Subjective    Discussed the use of AI scribe software for clinical note transcription with the patient, who gave verbal consent to proceed.  History of Present Illness   The patient presents with left foot pain and swelling that began after trying on new sandals. The pain is localized to the base of the big toe and is described as a sudden shooting pain that traveled up the ankle. The patient noticed swelling and redness in the area, which has since subsided with the application of an analgesic cream. She also noticed a white growth on the side of the foot, which is not painful. The patient denies any pain under the foot or in the ankle. 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 She also has a pronounced bunion on the affected foot. The patient has been managing the pain with the analgesic cream and ibuprofen as needed.        Review of Systems  Objective    BP (!) 152/73 (BP Location: Left Arm, Patient Position: Sitting, Cuff Size: Normal)   Pulse 68   Ht 5\' 1"  (1.549 m)   Wt 139 lb 3.2 oz (63.1 kg)   SpO2 99%   BMI 26.30 kg/m  Physical Exam Vitals reviewed.  Constitutional:      General: She is not in acute distress.    Appearance: She is well-developed.  HENT:     Head: Normocephalic and atraumatic.  Eyes:     General: No scleral icterus.    Conjunctiva/sclera: Conjunctivae normal.  Cardiovascular:     Rate and Rhythm: Normal rate and regular rhythm.  Pulmonary:     Effort: Pulmonary effort is normal. No respiratory distress.  Musculoskeletal:     Comments: Bony prominence at base of L great toe, some tenderness and redness  Skin:    General: Skin is warm and dry.      Findings: No rash.  Neurological:     Mental Status: She is alert and oriented to person, place, and time.  Psychiatric:        Behavior: Behavior normal.       No results found for any visits on 04/17/23.  Assessment & Plan     Problem List Items Addressed This Visit   None Visit Diagnoses       Pain of toe of left foot    -  Primary           Left Foot Pain with Suspected Gout and Bunion Presents with left foot pain, swelling, and a firm, bony bump at the base of the big toe, radiating to the ankle with associated redness. Differential diagnosis includes gout, arthritis, and bunion. 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 Analgesic cream is effective in managing pain. Discussed dietary modifications to avoid gout triggers and potential need for x-ray if no improvement. - Continue using analgesic cream - Avoid foods that can exacerbate gout - Consider x-ray if no improvement in two weeks  General Health Maintenance Has not had an eye examination in five to six  years but has an upcoming appointment scheduled. - Attend scheduled eye examination  Follow-up - Follow up if foot symptoms do not improve.       No orders of the defined types were placed in this encounter.    Return if symptoms worsen or fail to improve.      Shirlee Latch, MD  [facility] [phone] (phone) [phone] (fax)  [facility]

## 2023-05-01 DIAGNOSIS — H2513 Age-related nuclear cataract, bilateral: Secondary | ICD-10-CM | POA: Diagnosis not present

## 2023-05-02 DIAGNOSIS — M1711 Unilateral primary osteoarthritis, right knee: Secondary | ICD-10-CM | POA: Diagnosis not present

## 2023-05-02 DIAGNOSIS — G8929 Other chronic pain: Secondary | ICD-10-CM | POA: Diagnosis not present

## 2023-05-09 DIAGNOSIS — G8929 Other chronic pain: Secondary | ICD-10-CM | POA: Diagnosis not present

## 2023-05-09 DIAGNOSIS — M1711 Unilateral primary osteoarthritis, right knee: Secondary | ICD-10-CM | POA: Diagnosis not present

## 2023-05-09 DIAGNOSIS — M25561 Pain in right knee: Secondary | ICD-10-CM | POA: Diagnosis not present

## 2023-05-14 ENCOUNTER — Ambulatory Visit: Payer: PPO | Admitting: Family Medicine

## 2023-05-14 ENCOUNTER — Encounter: Payer: Self-pay | Admitting: Family Medicine

## 2023-05-14 VITALS — BP 134/63 | HR 62 | Ht 61.0 in | Wt 131.8 lb

## 2023-05-14 DIAGNOSIS — Z0001 Encounter for general adult medical examination with abnormal findings: Secondary | ICD-10-CM

## 2023-05-14 DIAGNOSIS — I10 Essential (primary) hypertension: Secondary | ICD-10-CM | POA: Diagnosis not present

## 2023-05-14 DIAGNOSIS — M810 Age-related osteoporosis without current pathological fracture: Secondary | ICD-10-CM | POA: Diagnosis not present

## 2023-05-14 DIAGNOSIS — E039 Hypothyroidism, unspecified: Secondary | ICD-10-CM | POA: Diagnosis not present

## 2023-05-14 DIAGNOSIS — E785 Hyperlipidemia, unspecified: Secondary | ICD-10-CM | POA: Diagnosis not present

## 2023-05-14 DIAGNOSIS — R202 Paresthesia of skin: Secondary | ICD-10-CM | POA: Diagnosis not present

## 2023-05-14 DIAGNOSIS — Z0000 Encounter for general adult medical examination without abnormal findings: Secondary | ICD-10-CM

## 2023-05-14 DIAGNOSIS — N182 Chronic kidney disease, stage 2 (mild): Secondary | ICD-10-CM | POA: Diagnosis not present

## 2023-05-14 DIAGNOSIS — J42 Unspecified chronic bronchitis: Secondary | ICD-10-CM

## 2023-05-14 NOTE — Patient Instructions (Addendum)
 [facility] doctor to verify appointment Laura Reas, MD Gastroenterology NPI: [id number] [place] [address]   Phone: [phone] Fax: [phone]    Please review the attached list of medications and notify my office if there are any errors.   Please bring all of your medications to every appointment so we can make sure that our medication list is the same as yours.  Please go to the lab draw station in [address] on the second floor of [facility] when you are fasting for 8 hours. Normal hours are 8:00 am to 11:30 am and 1:00 pm to 4:00 pm Monday through Friday.   Please visit you local pharmacy to update your tetanus and shingles vaccination.
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
## 2023-05-16 ENCOUNTER — Other Ambulatory Visit: Payer: Self-pay | Admitting: Family Medicine

## 2023-05-16 DIAGNOSIS — N182 Chronic kidney disease, stage 2 (mild): Secondary | ICD-10-CM

## 2023-05-16 DIAGNOSIS — E785 Hyperlipidemia, unspecified: Secondary | ICD-10-CM | POA: Diagnosis not present

## 2023-05-16 DIAGNOSIS — I10 Essential (primary) hypertension: Secondary | ICD-10-CM | POA: Diagnosis not present

## 2023-05-16 DIAGNOSIS — E039 Hypothyroidism, unspecified: Secondary | ICD-10-CM | POA: Diagnosis not present

## 2023-05-16 DIAGNOSIS — M1711 Unilateral primary osteoarthritis, right knee: Secondary | ICD-10-CM | POA: Diagnosis not present

## 2023-05-16 DIAGNOSIS — G8929 Other chronic pain: Secondary | ICD-10-CM | POA: Diagnosis not present

## 2023-05-17 LAB — COMPREHENSIVE METABOLIC PANEL
ALT: 11 IU/L (ref 0–32)
AST: 13 IU/L (ref 0–40)
Albumin: 4.1 g/dL (ref 3.7–4.7)
Alkaline Phosphatase: 62 IU/L (ref 44–121)
BUN/Creatinine Ratio: 10 — ABNORMAL LOW (ref 12–28)
BUN: 8 mg/dL (ref 8–27)
Bilirubin Total: 0.5 mg/dL (ref 0.0–1.2)
CO2: 22 mmol/L (ref 20–29)
Calcium: 8.8 mg/dL (ref 8.7–10.3)
Chloride: 103 mmol/L (ref 96–106)
Creatinine, Ser: 0.81 mg/dL (ref 0.57–1.00)
Globulin, Total: 1.9 g/dL (ref 1.5–4.5)
Glucose: 87 mg/dL (ref 70–99)
Potassium: 4.1 mmol/L (ref 3.5–5.2)
Sodium: 139 mmol/L (ref 134–144)
Total Protein: 6 g/dL (ref 6.0–8.5)
eGFR: 72 mL/min/{1.73_m2} (ref >59)

## 2023-05-17 LAB — TSH RFX ON ABNORMAL TO FREE T4: TSH: 2.67 u[IU]/mL (ref 0.450–4.500)

## 2023-05-17 LAB — LIPID PANEL
Chol/HDL Ratio: 3.5 ratio (ref 0.0–4.4)
Cholesterol, Total: 209 mg/dL — ABNORMAL HIGH (ref 100–199)
HDL: 59 mg/dL (ref >39)
LDL Chol Calc (NIH): 135 mg/dL — ABNORMAL HIGH (ref 0–99)
Triglycerides: 82 mg/dL (ref 0–149)
VLDL Cholesterol Cal: 15 mg/dL (ref 5–40)

## 2023-05-17 LAB — MICROALBUMIN / CREATININE URINE RATIO
Creatinine, Urine: 266 mg/dL
Microalb/Creat Ratio: 8 mg/g{creat} (ref 0–29)
Microalbumin, Urine: 20.3 ug/mL

## 2023-05-18 ENCOUNTER — Ambulatory Visit

## 2023-05-19 ENCOUNTER — Encounter: Payer: Self-pay | Admitting: Family Medicine

## 2023-05-19 DIAGNOSIS — N182 Chronic kidney disease, stage 2 (mild): Secondary | ICD-10-CM | POA: Insufficient documentation

## 2023-05-19 DIAGNOSIS — R202 Paresthesia of skin: Secondary | ICD-10-CM | POA: Insufficient documentation

## 2023-05-19 DIAGNOSIS — Z0000 Encounter for general adult medical examination without abnormal findings: Secondary | ICD-10-CM | POA: Insufficient documentation

## 2023-05-19 NOTE — Assessment & Plan Note (Signed)
 Continue ezetimibe 10 mg daily

## 2023-05-19 NOTE — Assessment & Plan Note (Signed)
 Ongoing management of osteoporosis with previous Reclast infusions.

## 2023-05-19 NOTE — Assessment & Plan Note (Signed)
 Physical exam overall unremarkable except as noted above. Routine lab work ordered as noted. Received COVID booster and flu vaccine. Due for shingles vaccine. Previous pneumonia vaccines received. Hesitant about RSV vaccine. - Administer shingles vaccine - Discuss RSV vaccine further
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
## 2023-05-19 NOTE — Assessment & Plan Note (Signed)
 Stable, controlled. Hypertension managed through lifestyle modifications, including stress avoidance and caffeine limitation. Previously on amlodipine, currently not taking antihypertensive medication.

## 2023-05-19 NOTE — Assessment & Plan Note (Signed)
 Chronic, stable. Continue albuterol inh prn.

## 2023-05-19 NOTE — Assessment & Plan Note (Signed)
 Noted.  No acute concerns.  Continue to monitor.

## 2023-05-19 NOTE — Assessment & Plan Note (Signed)
Continue levothyroxine 100 mcg daily.

## 2023-05-21 ENCOUNTER — Ambulatory Visit
Admission: RE | Admit: 2023-05-21 | Discharge: 2023-05-21 | Disposition: A | Discharge status: Home / Self Care | Source: Ambulatory Visit | Attending: Family Medicine | Admitting: Family Medicine

## 2023-05-21 DIAGNOSIS — R202 Paresthesia of skin: Secondary | ICD-10-CM | POA: Insufficient documentation

## 2023-05-21 DIAGNOSIS — I6523 Occlusion and stenosis of bilateral carotid arteries: Secondary | ICD-10-CM | POA: Diagnosis not present

## 2023-05-24 ENCOUNTER — Other Ambulatory Visit: Payer: Self-pay

## 2023-05-24 ENCOUNTER — Other Ambulatory Visit (HOSPITAL_COMMUNITY): Payer: Self-pay

## 2023-05-24 MED FILL — Ezetimibe Tab 10 MG: ORAL | 90 days supply | Qty: 90 | Fill #0 | Status: AC

## 2023-05-25 ENCOUNTER — Other Ambulatory Visit: Payer: Self-pay

## 2023-05-25 ENCOUNTER — Encounter: Payer: Self-pay | Admitting: Pharmacist

## 2023-05-25 ENCOUNTER — Other Ambulatory Visit (HOSPITAL_COMMUNITY): Payer: Self-pay

## 2023-05-25 ENCOUNTER — Encounter: Payer: Self-pay | Admitting: Family Medicine

## 2023-06-12 DIAGNOSIS — M1612 Unilateral primary osteoarthritis, left hip: Secondary | ICD-10-CM | POA: Diagnosis not present

## 2023-06-12 DIAGNOSIS — M7062 Trochanteric bursitis, left hip: Secondary | ICD-10-CM | POA: Diagnosis not present

## 2023-06-12 DIAGNOSIS — M47816 Spondylosis without myelopathy or radiculopathy, lumbar region: Secondary | ICD-10-CM | POA: Diagnosis not present

## 2023-06-12 DIAGNOSIS — Z96642 Presence of left artificial hip joint: Secondary | ICD-10-CM | POA: Diagnosis not present

## 2023-06-21 DIAGNOSIS — R1312 Dysphagia, oropharyngeal phase: Secondary | ICD-10-CM | POA: Insufficient documentation

## 2023-06-21 DIAGNOSIS — J42 Unspecified chronic bronchitis: Secondary | ICD-10-CM | POA: Diagnosis not present

## 2023-06-21 DIAGNOSIS — Z860101 Personal history of adenomatous and serrated colon polyps: Secondary | ICD-10-CM | POA: Diagnosis not present

## 2023-06-29 ENCOUNTER — Ambulatory Visit (INDEPENDENT_AMBULATORY_CARE_PROVIDER_SITE_OTHER): Admitting: Family Medicine

## 2023-06-29 ENCOUNTER — Encounter: Payer: Self-pay | Admitting: Family Medicine

## 2023-06-29 VITALS — BP 122/54 | HR 75 | Resp 17 | Ht 61.0 in | Wt 133.8 lb

## 2023-06-29 DIAGNOSIS — Z96642 Presence of left artificial hip joint: Secondary | ICD-10-CM | POA: Diagnosis not present

## 2023-06-29 DIAGNOSIS — K5900 Constipation, unspecified: Secondary | ICD-10-CM | POA: Diagnosis not present

## 2023-06-29 DIAGNOSIS — F552 Abuse of laxatives: Secondary | ICD-10-CM | POA: Insufficient documentation

## 2023-06-29 DIAGNOSIS — E7800 Pure hypercholesterolemia, unspecified: Secondary | ICD-10-CM

## 2023-06-29 DIAGNOSIS — Z79899 Other long term (current) drug therapy: Secondary | ICD-10-CM | POA: Diagnosis not present

## 2023-06-29 DIAGNOSIS — Z09 Encounter for follow-up examination after completed treatment for conditions other than malignant neoplasm: Secondary | ICD-10-CM

## 2023-06-29 DIAGNOSIS — I10 Essential (primary) hypertension: Secondary | ICD-10-CM

## 2023-06-29 NOTE — Progress Notes (Signed)
 Established patient visit   Patient: Laura Love   DOB: 1940-10-20   83 y.o. Female  MRN: [id number] Visit Date: 06/29/2023  Today's healthcare provider: Carlean Charter, DO   Chief Complaint  Patient presents with   Results    Pt wants to go over past lab and ultrasound results   Subjective    HPI Laura Love "Laura Love" is an 83 year old female who presents for follow-up on swallowing difficulties and constipation management and to review recent labs/imaging results.  She experiences ongoing difficulties with swallowing and has not yet completed the swallow test. She recalls her husband undergoing similar tests after being on a ventilator.  Her cholesterol management is ongoing, with a slightly elevated but stable LDL. Her total cholesterol is 209, the lowest in years (through 2016). She tries to eat healthily and is working on [employer].  She has a history of hip replacement and reports pain in the lateral aspect of her left leg, which limits her physical activity. She uses analgesic cream and ibuprofen for pain management and plans to start a Tai Chi class.  She experiences chronic constipation and relies on a regimen of laxatives, including a large blue pill (equate sennosides 25 mg twice daily) and occasionally a red pill (equate sennosides/docusate 8.6/50 mg 1-2x daily as needed).  She has a longstanding history of severe constipation, which once required a colonoscopy prep to relieve. No current issues with bowel movements when on her medication regimen, but severe constipation occurs if she misses a dose.  She has a history of high blood pressure and avoids caffeine to manage it. She experienced a significant blood pressure spike after consuming what she believed was caffeinated coffee, which she now avoids.  She is considering getting the COVID booster, shingles, and Tdap vaccines.      Medications: Outpatient Medications Prior to Visit  Medication Sig    acetaminophen  (TYLENOL ) 325 MG tablet Take 650 mg by mouth every 6 (six) hours as needed for moderate pain.   albuterol  (VENTOLIN  HFA) 108 (90 Base) MCG/ACT inhaler Inhale 2 puffs into the lungs every 6 (six) hours as needed for wheezing or shortness of breath.   amLODipine  (NORVASC ) 2.5 MG tablet Take 1 tablet (2.5 mg total) by mouth daily.   azelastine  (ASTELIN ) 0.1 % nasal spray Place 1 spray into both nostrils 2 (two) times daily as needed for rhinitis. Use in each nostril as directed   Biotin 1000 MCG tablet Take 5,000 mcg by mouth daily.   buPROPion  (WELLBUTRIN  SR) 150 MG 12 hr tablet Take 1 tablet (150 mg total) by mouth 2 (two) times daily.   CALCIUM PO Take 600 mg by mouth 2 (two) times daily. + D3 800 units   diphenhydrAMINE  (BENADRYL ) 25 MG tablet Take 25 mg by mouth every 6 (six) hours as needed.   diphenhydramine -acetaminophen  (TYLENOL  PM) 25-500 MG TABS tablet Take 1 tablet by mouth at bedtime as needed.   ezetimibe  (ZETIA ) 10 MG tablet Take 1 tablet (10 mg total) by mouth daily.   hyoscyamine  (ANASPAZ ) 0.125 MG TBDP disintergrating tablet Place 0.125 mg under the tongue every 4 (four) hours as needed for cramping.   ipratropium (ATROVENT ) 0.03 % nasal spray Place 2 sprays into both nostrils every 12 (twelve) hours.   Melatonin 5 MG CAPS Take 1 capsule by mouth every evening.   Multiple Vitamins-Minerals (CENTRUM SILVER 50+WOMEN PO) Take 1 tablet by mouth daily.   neomycin -polymyxin-hydrocortisone (CORTISPORIN ) OTIC  solution Place 3 drops into affected ear three times daily as needed   Sennosides 25 MG TABS Take 1 tablet by mouth daily.   sennosides-docusate sodium  (SENOKOT-S) 8.6-50 MG tablet Take 1 tablet by mouth daily as needed.   sodium chloride  (OCEAN) 0.65 % SOLN nasal spray Place 1 spray into both nostrils as needed for congestion.   SYNTHROID  100 MCG tablet TAKE 1 TABLET BY MOUTH DAILY   Tetrahydroz-Polyvinyl Al-Povid (CLEAR EYES TRIPLE ACTION OP) Apply 1-2 drops to eye 4  (four) times daily as needed.   traMADol  (ULTRAM ) 50 MG tablet Take 1-2 tablets (50-100 mg total) by mouth every 4 (four) hours as needed for moderate pain.   vitamin C (ASCORBIC ACID) 500 MG tablet Take 500 mg by mouth daily as needed.   Vitamin D , Cholecalciferol, 1000 UNITS TABS Take 2 tablets by mouth daily.   Zinc 30 MG CAPS Take 1 capsule by mouth daily as needed.   zoledronic  acid (RECLAST ) 5 MG/100ML SOLN injection Inject 5 mg into the vein. Once yearly starting June 2019 per Dr. Lorelei Rogers   No facility-administered medications prior to visit.    Review of Systems  Gastrointestinal:  Positive for constipation.  Musculoskeletal:  Positive for arthralgias (left lateral leg/hip).        Objective    BP (!) 122/54 (BP Location: Left Arm, Patient Position: Sitting, Cuff Size: Normal)   Pulse 75   Resp 17   Ht 5\' 1"  (1.549 m)   Wt 133 lb 12.8 oz (60.7 kg)   SpO2 98%   BMI 25.28 kg/m     Physical Exam Vitals and nursing note reviewed.  Constitutional:      General: She is not in acute distress.    Appearance: Normal appearance.  HENT:     Head: Normocephalic and atraumatic.  Eyes:     General: No scleral icterus.    Conjunctiva/sclera: Conjunctivae normal.  Cardiovascular:     Rate and Rhythm: Normal rate.  Pulmonary:     Effort: Pulmonary effort is normal.  Neurological:     Mental Status: She is alert and oriented to person, place, and time. Mental status is at baseline.  Psychiatric:        Mood and Affect: Mood normal.        Behavior: Behavior normal.      No results found for any visits on 06/29/23.  Assessment & Plan    Pure hypercholesterolemia  Follow-up exam, less than 3 months since previous exam  Laxative habit -     Comprehensive metabolic panel with GFR; Standing -     Magnesium ; Standing  High risk medication use -     Comprehensive metabolic panel with GFR; Standing -     Magnesium ; Standing  Primary hypertension  Constipation,  unspecified constipation type  Hx of total hip arthroplasty, left      Pure hypercholesterolemia Elevated total and LDL cholesterol, a chronic issue. Total cholesterol improved to 209. Not on statins due to previous adverse effects and personal concerns. - Continue monitoring cholesterol levels. -Continue ezetimibe  10 mg daily - Encourage dietary modifications to manage cholesterol.  Follow-up exam, less than 3 months since prior exam Follow-up visit; discussed normal ultrasound and metabolic panel results. Total cholesterol improved to 209. Discussed dietary habits and exercise limitations due to hip pain. - Continue current dietary habits and exercise regimen as tolerated. - Encourage participation in [place] and Pilates.   Hypertension Chronic, stable.  Continue amlodipine  2.5 mg daily.  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 Pain managed with ibuprofen 200 mg daily and analgesic cream. Advised to avoid movements that aggravate the incision area. - Continue ibuprofen and analgesic cream as needed. - Avoid movements that strain the hip incision area.  Dysphagia Ongoing swallowing issues. Awaiting results of recent swallow test with barium. - Await results of the swallow test with barium.  Chronic constipation Chronic constipation managed with senna-based laxatives. Regular bowel movements achieved with laxatives. Concerns about potential dependence and impact on health. - Monitor bowel movement regularity and adjust laxative use as needed. - Consider alternative dietary adjustments to improve bowel regularity.  Chronic use of laxatives Chronic use of senna-based laxatives with awareness of potential dependence and long-term effects. Blood work planned to monitor liver, potassium, and magnesium  levels due to potential risks. - Order blood work to monitor liver, potassium, and magnesium   levels every three months. - Educate on potential risks of chronic laxative use and encourage dietary modifications.  General Health Maintenance Discussed need for vaccinations including shingles, Tdap, and COVID booster. Prefers to receive vaccinations at a pharmacy. - Recommend receiving shingles, Tdap, and COVID booster vaccines at a preferred pharmacy.    Return in about 5 months (around 11/14/2023) for for mAWV as scheduled.      I discussed the assessment and treatment plan with the patient  The patient was provided an opportunity to ask questions and all were answered. The patient agreed with the plan and demonstrated an understanding of the instructions.   The patient was advised to call back or seek an in-person evaluation if the symptoms worsen or if the condition fails to improve as anticipated.    Carlean Charter, DO  [facility] [phone] (phone) [phone] (fax)  [facility]

## 2023-06-29 NOTE — Patient Instructions (Addendum)
 Recommend shingles and tetanus vaccines

## 2023-07-25 ENCOUNTER — Ambulatory Visit: Payer: Self-pay

## 2023-07-25 DIAGNOSIS — M545 Low back pain, unspecified: Secondary | ICD-10-CM | POA: Diagnosis not present

## 2023-07-25 DIAGNOSIS — S300XXA Contusion of lower back and pelvis, initial encounter: Secondary | ICD-10-CM | POA: Diagnosis not present

## 2023-07-25 NOTE — Telephone Encounter (Signed)
 Chief Complaint: Back Pain, following a fall Symptoms: 8/10 pain Frequency: x 4 days Pertinent Negatives: Patient denies weakness, numbness, weakness, urinary symptoms Disposition: [] ED /[] Urgent Care (no appt availability in office) / [x] Appointment(In office/virtual)/ []  [facility]/ [] Home Care/ [] Refused Recommended Disposition /[] [place] [facility]/ []  Follow-up with PCP Additional Notes: Pt reports she tripped over a curb Sunday, noting she has since been experiencing back pain at 8/10. Pt notes Tylenol /Motrin mildly effective. OV scheduled per protocol, pt requesting XR or sooner appt. CAL closed at this time, appt placed on wait list. Routing for review. This RN educated pt on home care, new-worsening symptoms, when to call back/seek emergent care. Pt verbalized understanding and agrees to plan.     Copied from CRM [id number]. Topic: Clinical - Red Word Triage >> Jul 25, 2023  7:37 AM Juluis Ok wrote: [facility] that prompted transfer to Nurse Triage: fall on 06/25-back pain Reason for Disposition  [1] MODERATE back pain (e.g., interferes with normal activities) AND [2] present > 3 days  Answer Assessment - Initial Assessment Questions 1. ONSET: "When did the pain begin?"      X 4 days 2. LOCATION: "Where does it hurt?" (upper, mid or lower back)     Low to mid back 3. SEVERITY: "How bad is the pain?"  (e.g., Scale 1-10; mild, moderate, or severe)   - MILD (1-3): Doesn't interfere with normal activities.    - MODERATE (4-7): Interferes with normal activities or awakens from sleep.    - SEVERE (8-10): Excruciating pain, unable to do any normal activities.      8/10 at worst 4. PATTERN: "Is the pain constant?" (e.g., yes, no; constant, intermittent)      Constant, Tylenol /Motrin giving some relief 5. RADIATION: "Does the pain shoot into your legs or somewhere else?"     None 6. CAUSE:  "What do you think is causing the back pain?"      Pt had a fall Sunday, tripped  over a curb onto bottom 8. MEDICINES: "What have you taken so far for the pain?" (e.g., nothing, acetaminophen , NSAIDS)     Tylenol , Motrin 9. NEUROLOGIC SYMPTOMS: "Do you have any weakness, numbness, or problems with bowel/bladder control?"     None 10. OTHER SYMPTOMS: "Do you have any other symptoms?" (e.g., fever, abdomen pain, burning with urination, blood in urine)       None  Protocols used: Back Pain-A-AH

## 2023-07-25 NOTE — Telephone Encounter (Signed)
 Appt made 07/26/23 with Dr Geraldene Kleine

## 2023-07-25 NOTE — Telephone Encounter (Signed)
 Noted

## 2023-07-26 ENCOUNTER — Ambulatory Visit: Admitting: Family Medicine

## 2023-08-03 ENCOUNTER — Telehealth: Payer: Self-pay

## 2023-08-03 NOTE — Telephone Encounter (Signed)
 Copied from CRM [id number]. Topic: Appointments - Scheduling Inquiry for Clinic >> Aug 03, 2023 10:50 AM Rennis Case wrote: Reason for CRM: Pt requesting to be called if any cancellations are available for Dr. Athena Bland. Pt scheduled upcoming appointment for 09/10/2023, pt is on waitlist but would prefer a phone call for any cancellations.

## 2023-08-06 ENCOUNTER — Ambulatory Visit: Admitting: Family Medicine

## 2023-08-09 DIAGNOSIS — S300XXA Contusion of lower back and pelvis, initial encounter: Secondary | ICD-10-CM | POA: Diagnosis not present

## 2023-08-09 DIAGNOSIS — M545 Low back pain, unspecified: Secondary | ICD-10-CM | POA: Diagnosis not present

## 2023-08-13 ENCOUNTER — Ambulatory Visit: Admitting: Family Medicine

## 2023-08-13 ENCOUNTER — Encounter: Payer: Self-pay | Admitting: Family Medicine

## 2023-08-13 VITALS — BP 144/74 | HR 67 | Resp 16 | Ht 63.0 in | Wt 134.0 lb

## 2023-08-13 DIAGNOSIS — H6123 Impacted cerumen, bilateral: Secondary | ICD-10-CM | POA: Diagnosis not present

## 2023-08-13 DIAGNOSIS — M545 Low back pain, unspecified: Secondary | ICD-10-CM | POA: Diagnosis not present

## 2023-08-13 DIAGNOSIS — H02401 Unspecified ptosis of right eyelid: Secondary | ICD-10-CM

## 2023-08-13 DIAGNOSIS — L989 Disorder of the skin and subcutaneous tissue, unspecified: Secondary | ICD-10-CM

## 2023-08-13 DIAGNOSIS — H9193 Unspecified hearing loss, bilateral: Secondary | ICD-10-CM

## 2023-08-13 MED ORDER — DEBROX 6.5 % OT SOLN
5.0000 [drp] | Freq: Two times a day (BID) | OTIC | 0 refills | Status: DC
Start: 2023-08-13 — End: 2023-10-10

## 2023-08-13 MED ORDER — METHYLPREDNISOLONE 4 MG PO TBPK: ORAL_TABLET | ORAL | 0 refills | Status: DC

## 2023-08-13 NOTE — Patient Instructions (Addendum)
 Discussed your low back pain.  I encourage you to put ice on your back several times throughout the day.  I went ahead and sent a steroid over to your pharmacy to help reduce the inflammation in your back and improve the pain. - I do strongly encourage you to remain active, do range of motion, stretching and strengthening exercises for your back  We also discussed your ears and earwax buildup.  Place the Debrox in your ears twice daily for up to 4 days prior to your next appointment so we can flush them at that appointment.  I did not send a referral to ENT for an evaluation of your hearing; let me know if you would like me to order this prior to her next visit.  Otherwise, we will wait and see how you do after we flush your ears.

## 2023-08-13 NOTE — Progress Notes (Unsigned)
 Established patient visit   Patient: Laura Love   DOB: Jul 20, 1940   83 y.o. Female  MRN: [id number] Visit Date: 08/13/2023  Today's healthcare provider: Carlean Charter, DO   Chief Complaint  Patient presents with   Follow-up    Fall f/u  pt has some concerns she wants to address.Pt may wants vaccines. Pt I'm some pain.   Subjective    HPI Last annual exam 05/14/2023  Laura Love is an 83 year old female who presents with back pain following a fall.  She experienced significant back pain after tripping on a curb and landing on her tailbone on May 22, 2023. She initially managed the pain with leftover tramadol  from a previous hip replacement (subsequently refilled by orthopedics for her current concern), along with ibuprofen and Tylenol . Tramadol  was discontinued over ten days ago as her condition improved, but discomfort persists, especially when wearing pants or anything that touches her backbone. Relief is found when lying down, and she uses an NSAID cream for additional pain management. She uses a walker to prevent further injury. Previous X-rays did not show any new injuries, but significant arthritis and previous deformities from past accidents were noted.  She has a persistent drooping of her eyelid on the right side, which becomes more pronounced when tired. 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 She is unsure if this affects her vision but notes it is noticeable to others.  Additionally, she has a non-healing lesion on her ankle that has been present for over a year. It has shown some improvement since scheduling her appointment but has not fully resolved. She has a dermatology appointment scheduled for October.  She has a history of constipation, which she manages with strong laxatives taken morning and night. She experienced exacerbation of constipation symptoms while taking  tramadol  but has found relief with the use of [employer] bran muffins.  She has noticed a decline in her hearing, particularly in one ear, and has difficulty determining the direction of sounds. She has a history of wax buildup but believes it is currently not an issue.  She has stopped consuming caffeine due to blood pressure concerns and notes that it affects her memory and is harder to concentrate when not drinking caffeine. She occasionally drinks regular Coke but avoids artificial sweeteners.      Medications: Outpatient Medications Prior to Visit  Medication Sig   acetaminophen  (TYLENOL ) 325 MG tablet Take 650 mg by mouth every 6 (six) hours as needed for moderate pain.   albuterol  (VENTOLIN  HFA) 108 (90 Base) MCG/ACT inhaler Inhale 2 puffs into the lungs every 6 (six) hours as needed for wheezing or shortness of breath.   amLODipine  (NORVASC ) 2.5 MG tablet Take 1 tablet (2.5 mg total) by mouth daily.   azelastine  (ASTELIN ) 0.1 % nasal spray Place 1 spray into both nostrils 2 (two) times daily as needed for rhinitis. Use in each nostril as directed   Biotin 1000 MCG tablet Take 5,000 mcg by mouth daily.   buPROPion  (WELLBUTRIN  SR) 150 MG 12 hr tablet Take 1 tablet (150 mg total) by mouth 2 (two) times daily.   CALCIUM PO Take 600 mg by mouth 2 (two) times daily. + D3 800 units   diphenhydrAMINE  (BENADRYL ) 25 MG tablet Take 25 mg by mouth every 6 (six) hours as needed.   diphenhydramine -acetaminophen  (TYLENOL  PM) 25-500 MG TABS  tablet Take 1 tablet by mouth at bedtime as needed.   ezetimibe  (ZETIA ) 10 MG tablet Take 1 tablet (10 mg total) by mouth daily.   hyoscyamine  (ANASPAZ ) 0.125 MG TBDP disintergrating tablet Place 0.125 mg under the tongue every 4 (four) hours as needed for cramping.   ipratropium (ATROVENT ) 0.03 % nasal spray Place 2 sprays into both nostrils every 12 (twelve) hours.   Melatonin 5 MG CAPS Take 1 capsule by mouth every evening.   Multiple Vitamins-Minerals  (CENTRUM SILVER 50+WOMEN PO) Take 1 tablet by mouth daily.   neomycin -polymyxin-hydrocortisone (CORTISPORIN ) OTIC solution Place 3 drops into affected ear three times daily as needed   Sennosides 25 MG TABS Take 1 tablet by mouth daily.   sennosides-docusate sodium  (SENOKOT-S) 8.6-50 MG tablet Take 1 tablet by mouth daily as needed.   sodium chloride  (OCEAN) 0.65 % SOLN nasal spray Place 1 spray into both nostrils as needed for congestion.   SYNTHROID  100 MCG tablet TAKE 1 TABLET BY MOUTH DAILY   Tetrahydroz-Polyvinyl Al-Povid (CLEAR EYES TRIPLE ACTION OP) Apply 1-2 drops to eye 4 (four) times daily as needed.   traMADol  (ULTRAM ) 50 MG tablet Take 1-2 tablets (50-100 mg total) by mouth every 4 (four) hours as needed for moderate pain.   vitamin C (ASCORBIC ACID) 500 MG tablet Take 500 mg by mouth daily as needed.   Vitamin D , Cholecalciferol, 1000 UNITS TABS Take 2 tablets by mouth daily.   Zinc 30 MG CAPS Take 1 capsule by mouth daily as needed.   zoledronic  acid (RECLAST ) 5 MG/100ML SOLN injection Inject 5 mg into the vein. Once yearly starting June 2019 per Dr. Lorelei Rogers   No facility-administered medications prior to visit.        Objective    BP (!) 144/74 (BP Location: Left Arm, Patient Position: Sitting, Cuff Size: Normal)   Pulse 67   Resp 16   Ht 5' 3 (1.6 m)   Wt 134 lb (60.8 kg)   SpO2 99%   BMI 23.74 kg/m     Physical Exam Vitals and nursing note reviewed.  Constitutional:      General: She is not in acute distress.    Appearance: Normal appearance.  HENT:     Head: Normocephalic and atraumatic.     Right Ear: Decreased hearing noted. There is impacted cerumen.     Left Ear: Decreased hearing noted. There is impacted cerumen.   Eyes:     General: No scleral icterus.    Conjunctiva/sclera: Conjunctivae normal.    Cardiovascular:     Rate and Rhythm: Normal rate.  Pulmonary:     Effort: Pulmonary effort is normal.   Musculoskeletal:     Lumbar back:  Tenderness (Muscular) present.       Back:   Neurological:     Mental Status: She is alert and oriented to person, place, and time. Mental status is at baseline.   Psychiatric:        Mood and Affect: Mood normal.        Behavior: Behavior normal.      No results found for any visits on 08/13/23.  Assessment & Plan    Acute bilateral low back pain without sciatica -     methylPREDNISolone; Take 6 pills on day 1, 5 pills on day 2, 4 pills on day 3, 3 pills on day 4, 2 pills on day 5, 1 pill on day 6  Dispense: 1 each; Refill: 0 -     Debrox;  Place 5 drops into both ears 2 (two) times daily. For 4 days just before next appointment.  Dispense: 15 mL; Refill: 0  Ptosis of right eyelid  Skin lesion of left lower extremity  Bilateral hearing loss, unspecified hearing loss type  Bilateral impacted cerumen      Acute on chronic back Pain Chronic back pain worsened by recent fall. X-rays show arthritis and old deformities. Pain managed with tramadol , ibuprofen, acetaminophen . Open to short steroid course. - Prescribed Medrol Pak with dose adjustment for adverse effects. - Advised gentle stretching and ice application. - Declined low-dose muscle relaxer - Encouraged ibuprofen and acetaminophen  use as needed.  Ptosis Right eyelid ptosis, worsens with fatigue, may affect vision. Considering surgery, concerned about costs and insurance. - Discussed potential ophthalmologist follow up for evaluation and possible blepharoplasty if vision affected.  Non-healing skin lesion of left lower extremity (ankle) Chronic ankle lesion, no cancer signs, dermatology appointment in October. - Monitor lesion until dermatology appointment unless symptoms worsen.  Hearing Loss; cerumen impaction Subjective right ear hearing loss, history of cerumen buildup. Not pursuing ENT referral currently. - Recommended Debrox drops for cerumen softening. - Plan cerumen removal and possible ENT referral if  issues persist.  General Health Maintenance Discussed Tdap and shingles vaccinations. Prioritize shingles vaccine due to history of previous Tdap, which offers some immunity. - As patient prefers to get 1 vaccine at a time, advised obtaining shingles vaccine first, followed by Tdap, then second shingles vaccine.   Scheduled for July already - plan to flush ears that visit, if still needed Return in about 4 months (around 12/13/2023).      I discussed the assessment and treatment plan with the patient  The patient was provided an opportunity to ask questions and all were answered. The patient agreed with the plan and demonstrated an understanding of the instructions.   The patient was advised to call back or seek an in-person evaluation if the symptoms worsen or if the condition fails to improve as anticipated.    Carlean Charter, DO  [facility] [phone] (phone) [phone] (fax)  [facility]

## 2023-08-16 DIAGNOSIS — M810 Age-related osteoporosis without current pathological fracture: Secondary | ICD-10-CM | POA: Diagnosis not present

## 2023-08-17 ENCOUNTER — Other Ambulatory Visit: Payer: Self-pay | Admitting: Family Medicine

## 2023-08-17 ENCOUNTER — Other Ambulatory Visit (HOSPITAL_COMMUNITY): Payer: Self-pay

## 2023-08-17 DIAGNOSIS — E785 Hyperlipidemia, unspecified: Secondary | ICD-10-CM

## 2023-08-17 MED ORDER — EZETIMIBE 10 MG PO TABS
10.0000 mg | ORAL_TABLET | Freq: Every day | ORAL | 2 refills | Status: AC
  Filled 2023-08-17: qty 90, 90d supply, fill #0
  Filled 2023-10-17 – 2023-10-24 (×2): qty 90, 90d supply, fill #1
  Filled 2024-01-09: qty 90, 90d supply, fill #2

## 2023-08-23 DIAGNOSIS — M810 Age-related osteoporosis without current pathological fracture: Secondary | ICD-10-CM | POA: Diagnosis not present

## 2023-08-23 DIAGNOSIS — E039 Hypothyroidism, unspecified: Secondary | ICD-10-CM | POA: Diagnosis not present

## 2023-09-05 ENCOUNTER — Encounter: Payer: Self-pay | Admitting: Family Medicine

## 2023-09-05 ENCOUNTER — Ambulatory Visit: Admitting: Family Medicine

## 2023-09-05 VITALS — BP 138/64 | HR 70 | Resp 14 | Ht 63.0 in | Wt 135.6 lb

## 2023-09-05 DIAGNOSIS — H9193 Unspecified hearing loss, bilateral: Secondary | ICD-10-CM

## 2023-09-05 DIAGNOSIS — M545 Low back pain, unspecified: Secondary | ICD-10-CM | POA: Diagnosis not present

## 2023-09-05 DIAGNOSIS — M109 Gout, unspecified: Secondary | ICD-10-CM | POA: Diagnosis not present

## 2023-09-05 DIAGNOSIS — H6123 Impacted cerumen, bilateral: Secondary | ICD-10-CM | POA: Diagnosis not present

## 2023-09-05 MED ORDER — COLCHICINE 0.6 MG PO TABS
ORAL_TABLET | ORAL | 0 refills | Status: AC
Start: 2023-09-05 — End: ?

## 2023-09-05 MED ORDER — ALLOPURINOL 100 MG PO TABS: 100.0000 mg | ORAL_TABLET | Freq: Every day | ORAL | 2 refills | Status: AC

## 2023-09-05 NOTE — Progress Notes (Signed)
 Established patient visit   Patient: Laura Love   DOB: [birth year]   83 y.o. Female  MRN: [id number] Visit Date: 09/05/2023  Today's healthcare provider: LAURAINE LOISE BUOY, DO   Chief Complaint  Patient presents with   Follow-up    Follow up from a fall. Not seeing any improvements, possibly want Rehab.   Hypertension    Blood pressure has been on lower side for a while, feels fatigue when it goes low.    Subjective    Hypertension  Laura Love is an 83 year old female who presents with persistent pain and swelling following a fall.  She experiences ongoing pain and swelling primarily in her back, which she attributes to a recent fall. Her whole body feels swollen, and her toenails turned purple about a week ago, although they have improved since then. She has been using a cream on her back and taking ibuprofen and Tylenol  to manage the pain.  She suspects a recurrence of gout, a condition she experienced approximately three years ago.  She notes she has had pain and swelling in the bunion area on both feet. She has not engaged in formal physical therapy but has been attempting home exercises, which seem to irritate her condition rather than improve it. She is frustrated with the lack of improvement and the impact on her daily life.  She has been using ear drops for the past four to five days, twice a day, to address ear issues, and reports some improvement in her hearing ability.  She mentions a history of low blood pressure, with readings as low as 87/50, and notes that she feels better when her blood pressure is higher. She is not currently on any blood pressure medication but manages low readings with occasional caffeine intake.  She recently restarted taking Citracal with magnesium  for calcium supplementation, after realizing she had stopped about a year ago. She takes one pill at night to avoid stomach upset. She lives on the fourth floor of a building built  like a hospital, providing a quiet environment.      Medications: Outpatient Medications Prior to Visit  Medication Sig   acetaminophen  (TYLENOL ) 325 MG tablet Take 650 mg by mouth every 6 (six) hours as needed for moderate pain.   albuterol  (VENTOLIN  HFA) 108 (90 Base) MCG/ACT inhaler Inhale 2 puffs into the lungs every 6 (six) hours as needed for wheezing or shortness of breath.   azelastine  (ASTELIN ) 0.1 % nasal spray Place 1 spray into both nostrils 2 (two) times daily as needed for rhinitis. Use in each nostril as directed   Biotin 1000 MCG tablet Take 5,000 mcg by mouth daily.   buPROPion  (WELLBUTRIN  SR) 150 MG 12 hr tablet Take 1 tablet (150 mg total) by mouth 2 (two) times daily.   CALCIUM PO Take 600 mg by mouth 2 (two) times daily. + D3 800 units   carbamide peroxide (DEBROX) 6.5 % OTIC solution Place 5 drops into both ears 2 (two) times daily. For 4 days just before next appointment.   diphenhydrAMINE  (BENADRYL ) 25 MG tablet Take 25 mg by mouth every 6 (six) hours as needed.   diphenhydramine -acetaminophen  (TYLENOL  PM) 25-500 MG TABS tablet Take 1 tablet by mouth at bedtime as needed.   ezetimibe  (ZETIA ) 10 MG tablet Take 1 tablet (10 mg total) by mouth daily.   hyoscyamine  (ANASPAZ ) 0.125 MG TBDP disintergrating tablet Place 0.125 mg under the tongue every 4 (four) hours  as needed for cramping.   ipratropium (ATROVENT ) 0.03 % nasal spray Place 2 sprays into both nostrils every 12 (twelve) hours.   Melatonin 5 MG CAPS Take 1 capsule by mouth every evening.   Multiple Vitamins-Minerals (CENTRUM SILVER 50+WOMEN PO) Take 1 tablet by mouth daily.   neomycin -polymyxin-hydrocortisone (CORTISPORIN ) OTIC solution Place 3 drops into affected ear three times daily as needed   Sennosides 25 MG TABS Take 1 tablet by mouth daily.   sennosides-docusate sodium  (SENOKOT-S) 8.6-50 MG tablet Take 1 tablet by mouth daily as needed.   sodium chloride  (OCEAN) 0.65 % SOLN nasal spray Place 1 spray  into both nostrils as needed for congestion.   SYNTHROID  100 MCG tablet TAKE 1 TABLET BY MOUTH DAILY   Tetrahydroz-Polyvinyl Al-Povid (CLEAR EYES TRIPLE ACTION OP) Apply 1-2 drops to eye 4 (four) times daily as needed.   traMADol  (ULTRAM ) 50 MG tablet Take 1-2 tablets (50-100 mg total) by mouth every 4 (four) hours as needed for moderate pain.   vitamin C (ASCORBIC ACID) 500 MG tablet Take 500 mg by mouth daily as needed.   Vitamin D , Cholecalciferol, 1000 UNITS TABS Take 2 tablets by mouth daily.   Zinc 30 MG CAPS Take 1 capsule by mouth daily as needed.   zoledronic  acid (RECLAST ) 5 MG/100ML SOLN injection Inject 5 mg into the vein. Once yearly starting June 2019 per Dr. Solum   [DISCONTINUED] amLODipine  (NORVASC ) 2.5 MG tablet Take 1 tablet (2.5 mg total) by mouth daily.   [DISCONTINUED] methylPREDNISolone  (MEDROL  DOSEPAK) 4 MG TBPK tablet Take 6 pills on day 1, 5 pills on day 2, 4 pills on day 3, 3 pills on day 4, 2 pills on day 5, 1 pill on day 6   No facility-administered medications prior to visit.        Objective    BP 138/64 (BP Location: Left Arm, Patient Position: Sitting, Cuff Size: Normal)   Pulse 70   Resp 14   Ht 5' 3 (1.6 m)   Wt 135 lb 9.6 oz (61.5 kg)   SpO2 97%   BMI 24.02 kg/m     Physical Exam Vitals and nursing note reviewed.  Constitutional:      General: She is not in acute distress.    Appearance: Normal appearance.  HENT:     Head: Normocephalic and atraumatic.  Eyes:     General: No scleral icterus.    Conjunctiva/sclera: Conjunctivae normal.  Cardiovascular:     Rate and Rhythm: Normal rate.  Pulmonary:     Effort: Pulmonary effort is normal.  Neurological:     Mental Status: She is alert and oriented to person, place, and time. Mental status is at baseline.  Psychiatric:        Mood and Affect: Mood normal.        Behavior: Behavior normal.      No results found for any visits on 09/05/23.  Assessment & Plan    Acute bilateral low  back pain without sciatica -     Ambulatory referral to Physical Therapy  Acute gout of multiple sites, unspecified cause -     Allopurinol ; Take 1 tablet (100 mg total) by mouth daily. Start day after colchicine .  Dispense: 30 tablet; Refill: 2 -     Colchicine ; Take 1.2 mg (two 0.6-mg tablets) orally at the first sign of a flare followed by 0.6 mg (1 tablet) one hour later; MAX 1.8 mg over 1 hour.  Wait 12 hours and then take  1 tablet every 12 hours until symptoms improve.  Dispense: 30 tablet; Refill: 0  Bilateral hearing loss, unspecified hearing loss type  Bilateral impacted cerumen -     Ear Cerumen Removal      Back Pain Chronic muscular back pain with persistent discomfort post-fall. Suspected muscular involvement. - Refer to [facility] for physical therapy. - Consider additional imaging if symptoms do not improve.  Gout Recurrent gout flare-up in feet, particularly around bunion area. Colchicine  discussed for acute management, allopurinol  for long-term control. - Prescribe colchicine  for acute gout flare. - Consider allopurinol  for long-term management after acute flare subsides.  Hypotension Episodes of hypotension with low readings. Advised against medication, suggested caffeine for management. - Advise using caffeine to manage low blood pressure episodes.  Bilateral hearing loss; Bilateral cerumen impaction Decreased hearing, potential earwax buildup. Ear irrigation discussed at previous visit; patient prepped with debrox for the past 4-5 days.  Cerumen impaction is a recurrent problem.  Hand-written order given for [facility] to be able to clean out patient's ears. - Perform ear irrigation. Ear Cerumen Removal  Date/Time: 09/05/2023 2:05 PM  Performed by: Donzella Lauraine SAILOR, DO Authorized by: Donzella Lauraine SAILOR, DO   Anesthesia: Local Anesthetic: none Ceruminolytics applied: Ceruminolytics applied prior to the procedure. Location details: right  ear and left ear Comments: Patient tolerated the procedure fairly well overall but did feel as though she was experiencing some pain, for which we stopped the procedure.  No acute concerns on repeat exam.  However, we were unable to completely remove the cerumen from her ear. Procedure type: irrigation  Sedation: Patient sedated: no     General Health Maintenance Advised delaying shingles vaccine to avoid flu-like symptoms during current health issues. - Delay shingles vaccine until current health issues are resolved.    Return in about 5 weeks (around 10/10/2023) for Chronic f/u.      I discussed the assessment and treatment plan with the patient  The patient was provided an opportunity to ask questions and all were answered. The patient agreed with the plan and demonstrated an understanding of the instructions.   The patient was advised to call back or seek an in-person evaluation if the symptoms worsen or if the condition fails to improve as anticipated.    LAURAINE SAILOR DONZELLA, DO  [facility] [phone] (phone) [phone] (fax)  [facility]

## 2023-09-05 NOTE — Patient Instructions (Signed)
 Attend physical therapy at [facility] and schedule a follow up appointment with Dr. Zeke.

## 2023-09-10 ENCOUNTER — Ambulatory Visit: Admitting: Family Medicine

## 2023-09-11 ENCOUNTER — Encounter: Payer: Self-pay | Admitting: Family Medicine

## 2023-09-11 DIAGNOSIS — H6123 Impacted cerumen, bilateral: Secondary | ICD-10-CM | POA: Diagnosis not present

## 2023-09-11 DIAGNOSIS — H903 Sensorineural hearing loss, bilateral: Secondary | ICD-10-CM | POA: Diagnosis not present

## 2023-09-26 DIAGNOSIS — M810 Age-related osteoporosis without current pathological fracture: Secondary | ICD-10-CM | POA: Diagnosis not present

## 2023-10-03 DIAGNOSIS — M810 Age-related osteoporosis without current pathological fracture: Secondary | ICD-10-CM | POA: Diagnosis not present

## 2023-10-04 DIAGNOSIS — M533 Sacrococcygeal disorders, not elsewhere classified: Secondary | ICD-10-CM | POA: Diagnosis not present

## 2023-10-04 DIAGNOSIS — M545 Low back pain, unspecified: Secondary | ICD-10-CM | POA: Diagnosis not present

## 2023-10-04 DIAGNOSIS — S300XXA Contusion of lower back and pelvis, initial encounter: Secondary | ICD-10-CM | POA: Diagnosis not present

## 2023-10-10 ENCOUNTER — Ambulatory Visit: Admitting: Family Medicine

## 2023-10-10 ENCOUNTER — Encounter: Payer: Self-pay | Admitting: Family Medicine

## 2023-10-10 VITALS — BP 136/69 | HR 67 | Temp 98.2°F | Ht 63.0 in | Wt 135.6 lb

## 2023-10-10 DIAGNOSIS — M810 Age-related osteoporosis without current pathological fracture: Secondary | ICD-10-CM | POA: Diagnosis not present

## 2023-10-10 DIAGNOSIS — M545 Low back pain, unspecified: Secondary | ICD-10-CM

## 2023-10-10 NOTE — Patient Instructions (Signed)
 Recommended vaccines (to get at pharmacy or [facility]): - Shingrix  (for shingles) - Tdap (tetanus, diphtheria and pertussis) - Flu vaccine

## 2023-10-10 NOTE — Progress Notes (Signed)
 Established patient visit   Patient: Laura Love   DOB: [birth year]   83 y.o. Female  MRN: [id number] Visit Date: 10/10/2023  Today's healthcare provider: LAURAINE LOISE BUOY, DO   Chief Complaint  Patient presents with   Medical Management of Chronic Issues    Patient reports after dexa she learned her bone density is worsening, states she is now doing calcium and vitamin d  combo supplement. States she is doing injection as well (evenity). Has some questions about calcium supplements    Subjective    HPI Laura Love is an 83 year old female who presents with ear issues and follow-up on bone health treatment.  She recently saw ENT for an additional ear cleaning procedure, which involved removing crusted material from her eardrum. She reports that her hearing is better and believes the ENT said it was approximately 80% in both ears.   She is currently receiving Evenity injections for osteoporosis, having completed two sets and transitioning to a monthly schedule. She takes Citracal, a time-release calcium supplement, but has reduced the frequency to every other day due to constipation. She manages constipation with [employer] bran muffins, consuming a quarter in the morning and evening.  Her back pain has improved, allowing her to reduce reliance on a walker and resume exercise classes. She feels her body is 'like one big piece of flab' due to inactivity but is gradually increasing her activity level. She reports pain in the SI joint area and wanted to further discuss the benefits and drawbacks of the cortisone injection her orthopedic doctor recommended.  She states her vision has improved significantly due to cataracts correcting her vision, resulting in better than 20/20 vision without correction.  She continues to experience pain primarily in the right hip area, worse when she bends toward that side, which she describes as her rib cage pressing down on her hip. She  manages pain with ibuprofen and Tylenol , noting minimal pain when resting but increased pain upon standing. She is participating in a balance class to improve strength and mobility.  She mentions a past experience with her daughter's hip injection that was too strong, causing a cavity in the hip, which took over a year to heal, making her cautious about receiving injections.       Medications: Outpatient Medications Prior to Visit  Medication Sig Note   acetaminophen  (TYLENOL ) 325 MG tablet Take 650 mg by mouth every 6 (six) hours as needed for moderate pain.    albuterol  (VENTOLIN  HFA) 108 (90 Base) MCG/ACT inhaler Inhale 2 puffs into the lungs every 6 (six) hours as needed for wheezing or shortness of breath.    allopurinol  (ZYLOPRIM ) 100 MG tablet Take 1 tablet (100 mg total) by mouth daily. Start day after colchicine .    azelastine  (ASTELIN ) 0.1 % nasal spray Place 1 spray into both nostrils 2 (two) times daily as needed for rhinitis. Use in each nostril as directed    Biotin 1000 MCG tablet Take 5,000 mcg by mouth daily.    buPROPion  (WELLBUTRIN  SR) 150 MG 12 hr tablet Take 1 tablet (150 mg total) by mouth 2 (two) times daily.    calcium-vitamin D  (OSCAL WITH D) 500-5 MG-MCG tablet Take 1 tablet by mouth daily.    colchicine  0.6 MG tablet Take 1.2 mg (two 0.6-mg tablets) orally at the first sign of a flare followed by 0.6 mg (1 tablet) one hour later; MAX 1.8 mg over 1 hour.  Wait 12 hours and then take 1 tablet every 12 hours until symptoms improve.    diphenhydrAMINE  (BENADRYL ) 25 MG tablet Take 25 mg by mouth every 6 (six) hours as needed.    diphenhydramine -acetaminophen  (TYLENOL  PM) 25-500 MG TABS tablet Take 1 tablet by mouth at bedtime as needed.    ezetimibe  (ZETIA ) 10 MG tablet Take 1 tablet (10 mg total) by mouth daily.    hyoscyamine  (ANASPAZ ) 0.125 MG TBDP disintergrating tablet Place 0.125 mg under the tongue every 4 (four) hours as needed for cramping.    ipratropium  (ATROVENT ) 0.03 % nasal spray Place 2 sprays into both nostrils every 12 (twelve) hours.    Melatonin 5 MG CAPS Take 1 capsule by mouth every evening.    Multiple Vitamins-Minerals (CENTRUM SILVER 50+WOMEN PO) Take 1 tablet by mouth daily.    neomycin -polymyxin-hydrocortisone (CORTISPORIN ) OTIC solution Place 3 drops into affected ear three times daily as needed    Romosozumab-aqqg (EVENITY) 105 MG/1.17ML SOSY injection Inject 210 mg into the skin once.    Sennosides 25 MG TABS Take 1 tablet by mouth daily.    sennosides-docusate sodium  (SENOKOT-S) 8.6-50 MG tablet Take 1 tablet by mouth daily as needed.    sodium chloride  (OCEAN) 0.65 % SOLN nasal spray Place 1 spray into both nostrils as needed for congestion.    SYNTHROID  100 MCG tablet TAKE 1 TABLET BY MOUTH DAILY    Tetrahydroz-Polyvinyl Al-Povid (CLEAR EYES TRIPLE ACTION OP) Apply 1-2 drops to eye 4 (four) times daily as needed.    traMADol  (ULTRAM ) 50 MG tablet Take 1-2 tablets (50-100 mg total) by mouth every 4 (four) hours as needed for moderate pain.    vitamin C (ASCORBIC ACID) 500 MG tablet Take 500 mg by mouth daily as needed.    Vitamin D , Cholecalciferol, 1000 UNITS TABS Take 2 tablets by mouth daily.    Zinc 30 MG CAPS Take 1 capsule by mouth daily as needed.    [DISCONTINUED] CALCIUM PO Take 600 mg by mouth 2 (two) times daily. + D3 800 units    [DISCONTINUED] carbamide peroxide (DEBROX) 6.5 % OTIC solution Place 5 drops into both ears 2 (two) times daily. For 4 days just before next appointment.    [DISCONTINUED] zoledronic  acid (RECLAST ) 5 MG/100ML SOLN injection Inject 5 mg into the vein. Once yearly starting June 2019 per Dr. Damian 10/10/2023: to Evenity   No facility-administered medications prior to visit.        Objective    BP 136/69 (BP Location: Left Arm, Patient Position: Sitting, Cuff Size: Normal)   Pulse 67   Temp 98.2 F (36.8 C) (Oral)   Ht 5' 3 (1.6 m)   Wt 135 lb 9.6 oz (61.5 kg)   SpO2 97%   BMI  24.02 kg/m     Physical Exam Vitals and nursing note reviewed.  Constitutional:      General: She is not in acute distress.    Appearance: Normal appearance.  HENT:     Head: Normocephalic and atraumatic.  Eyes:     General: No scleral icterus.    Conjunctiva/sclera: Conjunctivae normal.  Cardiovascular:     Rate and Rhythm: Normal rate.  Pulmonary:     Effort: Pulmonary effort is normal.  Neurological:     Mental Status: She is alert and oriented to person, place, and time. Mental status is at baseline.  Psychiatric:        Mood and Affect: Mood normal.  Behavior: Behavior normal.      No results found for any visits on 10/10/23.  Assessment & Plan    Acute right-sided low back pain without sciatica  Osteoporosis, unspecified osteoporosis type, unspecified pathological fracture presence      Acute right-sided back pain Doing much better. Saw orthopedics but opted to defer SI joint injection. Recommend continued gentle stretching and exercises, with as-needed ibuprofen and tylenol .  Consider SI joint injection if improvement stalls.  - Continue ibuprofen and acetaminophen  as needed. - Encourage balance and stretching exercises. - Consider cortisone injection if pain worsens, stops improving or function declines.  Osteoporosis Most recent DEXA 08/16/2023 with right femoral neck and right total hip T-score -2.6.  Currently on Evenity and tolerating it well. igh copay acknowledged but considered necessary for bone health. - Continue Citracal, with every other day dosing to manage constipation. - Continue Evenity injections monthly.  - Following with endocrinology; defer to specialist management.  Constipation Chronic constipation managed by adjusting Citracal intake and dietary aids. - Continue Citracal every other day. - Continue dietary management with bran muffins.  Hearing impairment Improved after ENT intervention with earwax removal. Hearing reported  to be at 80% in both ears. - Consider follow-up with ENT for earwax management.  General health maintenance Advised patient to update Tdap, Shingrix  and inlfuenza (when available) vaccines at her pharmacy or at [facility].  Engaging in balance classes and exercises for physical health and fall prevention. - Encourage participation in balance classes and regular exercise.    Return in about 4 weeks (around 11/07/2023) for Chronic f/u.      I discussed the assessment and treatment plan with the patient  The patient was provided an opportunity to ask questions and all were answered. The patient agreed with the plan and demonstrated an understanding of the instructions.   The patient was advised to call back or seek an in-person evaluation if the symptoms worsen or if the condition fails to improve as anticipated.    LAURAINE LOISE BUOY, DO  [facility] [phone] (phone) [phone] (fax)  [facility]

## 2023-10-16 ENCOUNTER — Other Ambulatory Visit: Payer: Self-pay | Admitting: Family Medicine

## 2023-10-16 DIAGNOSIS — Z1231 Encounter for screening mammogram for malignant neoplasm of breast: Secondary | ICD-10-CM

## 2023-10-17 ENCOUNTER — Other Ambulatory Visit (HOSPITAL_COMMUNITY): Payer: Self-pay

## 2023-10-17 ENCOUNTER — Other Ambulatory Visit: Payer: Self-pay

## 2023-11-08 DIAGNOSIS — M810 Age-related osteoporosis without current pathological fracture: Secondary | ICD-10-CM | POA: Diagnosis not present

## 2023-11-12 ENCOUNTER — Ambulatory Visit: Admitting: Family Medicine

## 2023-11-12 ENCOUNTER — Encounter: Payer: Self-pay | Admitting: Family Medicine

## 2023-11-12 VITALS — BP 118/60 | HR 77 | Resp 16 | Wt 135.2 lb

## 2023-11-12 DIAGNOSIS — M21619 Bunion of unspecified foot: Secondary | ICD-10-CM

## 2023-11-12 DIAGNOSIS — M810 Age-related osteoporosis without current pathological fracture: Secondary | ICD-10-CM | POA: Diagnosis not present

## 2023-11-12 DIAGNOSIS — K5909 Other constipation: Secondary | ICD-10-CM | POA: Diagnosis not present

## 2023-11-12 NOTE — Progress Notes (Signed)
 Established patient visit   Patient: Laura Love   DOB: 07-28-1940   83 y.o. Female  MRN: [id number] Visit Date: 11/12/2023  Today's healthcare provider: LAURAINE LOISE BUOY, DO   Chief Complaint  Patient presents with   Medical Management of Chronic Issues   Subjective    HPI Laura Love is an 83 year old female who presents with concerns about side effects from Evenity  injections and constipation.  She has been receiving Evenity  injections for bone density since July and experiences waves of nausea and a 'hunky feeling' that she associates with the medication. She has received three sets of injections, with the most recent being last week. She is concerned about the frequency of the injections and wonders if spacing them out might help with her symptoms.  She is experiencing constipation despite using bran muffins and laxatives. She consumes half a muffin daily, which contains 320 calories, and is cautious about increasing intake due to calorie content. She also eats Raisin Bran cereal but finds it ineffective. She acknowledges inadequate fluid intake, especially on busy days. She is taking Citracal with a time-release formula, which she suspects contributes to her constipation, and skips a dose every third day.  She manages bunion pain with a cream and is not currently using her albuterol  inhaler, as she has not experienced breathing issues recently. For throat irritation, she uses organic honey-based cough drops (Melaleuca brand).  She is considering vitamin D  and B12 supplementation but is cautious about potential overuse. She does not take a multivitamin and is trying to increase her calcium intake through milk and spinach, though she is aware that milk may contribute to constipation.       Medications: Outpatient Medications Prior to Visit  Medication Sig   acetaminophen  (TYLENOL ) 325 MG tablet Take 650 mg by mouth every 6 (six) hours as needed for moderate  pain.   albuterol  (VENTOLIN  HFA) 108 (90 Base) MCG/ACT inhaler Inhale 2 puffs into the lungs every 6 (six) hours as needed for wheezing or shortness of breath.   allopurinol  (ZYLOPRIM ) 100 MG tablet Take 1 tablet (100 mg total) by mouth daily. Start day after colchicine .   azelastine  (ASTELIN ) 0.1 % nasal spray Place 1 spray into both nostrils 2 (two) times daily as needed for rhinitis. Use in each nostril as directed   Biotin 1000 MCG tablet Take 5,000 mcg by mouth daily.   buPROPion  (WELLBUTRIN  SR) 150 MG 12 hr tablet Take 1 tablet (150 mg total) by mouth 2 (two) times daily.   calcium-vitamin D  (OSCAL WITH D) 500-5 MG-MCG tablet Take 1 tablet by mouth daily.   colchicine  0.6 MG tablet Take 1.2 mg (two 0.6-mg tablets) orally at the first sign of a flare followed by 0.6 mg (1 tablet) one hour later; MAX 1.8 mg over 1 hour.  Wait 12 hours and then take 1 tablet every 12 hours until symptoms improve.   diphenhydrAMINE  (BENADRYL ) 25 MG tablet Take 25 mg by mouth every 6 (six) hours as needed.   diphenhydramine -acetaminophen  (TYLENOL  PM) 25-500 MG TABS tablet Take 1 tablet by mouth at bedtime as needed.   ezetimibe  (ZETIA ) 10 MG tablet Take 1 tablet (10 mg total) by mouth daily.   hyoscyamine  (ANASPAZ ) 0.125 MG TBDP disintergrating tablet Place 0.125 mg under the tongue every 4 (four) hours as needed for cramping.   ipratropium (ATROVENT ) 0.03 % nasal spray Place 2 sprays into both nostrils every 12 (twelve) hours.   Melatonin 5  MG CAPS Take 1 capsule by mouth every evening.   neomycin -polymyxin-hydrocortisone (CORTISPORIN ) OTIC solution Place 3 drops into affected ear three times daily as needed   Romosozumab -aqqg (EVENITY ) 105 MG/1.17ML SOSY injection Inject 210 mg into the skin once.   Sennosides 25 MG TABS Take 1 tablet by mouth daily.   sennosides-docusate sodium  (SENOKOT-S) 8.6-50 MG tablet Take 1 tablet by mouth daily as needed.   sodium chloride  (OCEAN) 0.65 % SOLN nasal spray Place 1 spray  into both nostrils as needed for congestion.   SYNTHROID  100 MCG tablet TAKE 1 TABLET BY MOUTH DAILY   Tetrahydroz-Polyvinyl Al-Povid (CLEAR EYES TRIPLE ACTION OP) Apply 1-2 drops to eye 4 (four) times daily as needed.   traMADol  (ULTRAM ) 50 MG tablet Take 1-2 tablets (50-100 mg total) by mouth every 4 (four) hours as needed for moderate pain.   vitamin C (ASCORBIC ACID) 500 MG tablet Take 500 mg by mouth daily as needed.   Vitamin D , Cholecalciferol, 1000 UNITS TABS Take 2 tablets by mouth daily.   Zinc 30 MG CAPS Take 1 capsule by mouth daily as needed.   [DISCONTINUED] Multiple Vitamins-Minerals (CENTRUM SILVER 50+WOMEN PO) Take 1 tablet by mouth daily.   No facility-administered medications prior to visit.        Objective    BP 118/60 (BP Location: Left Arm, Patient Position: Sitting, Cuff Size: Normal)   Pulse 77   Resp 16   Wt 135 lb 3.2 oz (61.3 kg)   SpO2 97%   BMI 23.95 kg/m     Physical Exam Vitals and nursing note reviewed.  Constitutional:      General: She is not in acute distress.    Appearance: Normal appearance.  HENT:     Head: Normocephalic and atraumatic.  Eyes:     General: No scleral icterus.    Conjunctiva/sclera: Conjunctivae normal.  Cardiovascular:     Rate and Rhythm: Normal rate.  Pulmonary:     Effort: Pulmonary effort is normal.  Neurological:     Mental Status: She is alert and oriented to person, place, and time. Mental status is at baseline.  Psychiatric:        Mood and Affect: Mood normal.        Behavior: Behavior normal.      No results found for any visits on 11/12/23.  Assessment & Plan    Chronic constipation  Age-related osteoporosis without current pathological fracture  Bunion     Age-related osteoporosis without current pathological fracture On Evenity  since July with nausea and 'hunky' feeling. Concerned about dosing frequency and side effects. - Continue Evenity  therapy. - Consider discussing extending dosing  interval to six weeks with endocrinology if side effects persist. - Follows with endocrinology; defer to specialist management.  Chronic constipation Persistent constipation despite dietary measures. Citracal may contribute. - Increase fluid intake. - Monitor and adjust Citracal intake. - Encourage dietary calcium sources like spinach and fortified orange juice.  Bunion Ongoing issues managed with cream. - Continue current bunion cream.  General Health Maintenance Scheduled for COVID booster. Considering vitamin B12 for memory. Increasing dietary calcium intake. - Obtain COVID booster at local pharmacy. - Consider vitamin B12 supplementation if needed.  Defer check of B12 level to next full set of blood work per patient preference.    Return in about 1 month (around 12/12/2023) for Chronic f/u.      I discussed the assessment and treatment plan with the patient  The patient was provided an opportunity  to ask questions and all were answered. The patient agreed with the plan and demonstrated an understanding of the instructions.   The patient was advised to call back or seek an in-person evaluation if the symptoms worsen or if the condition fails to improve as anticipated.    LAURAINE LOISE BUOY, DO  [facility] [phone] (phone) [phone] (fax)  [facility]

## 2023-11-14 ENCOUNTER — Ambulatory Visit
Admission: RE | Admit: 2023-11-14 | Discharge: 2023-11-14 | Disposition: A | Discharge status: Home / Self Care | Source: Ambulatory Visit | Attending: Family Medicine | Admitting: Family Medicine

## 2023-11-14 ENCOUNTER — Ambulatory Visit: Admitting: Family Medicine

## 2023-11-14 DIAGNOSIS — Z1231 Encounter for screening mammogram for malignant neoplasm of breast: Secondary | ICD-10-CM | POA: Insufficient documentation

## 2023-11-19 ENCOUNTER — Ambulatory Visit: Payer: Self-pay | Admitting: Family Medicine

## 2023-11-28 DIAGNOSIS — H11442 Conjunctival cysts, left eye: Secondary | ICD-10-CM | POA: Diagnosis not present

## 2023-12-11 DIAGNOSIS — M810 Age-related osteoporosis without current pathological fracture: Secondary | ICD-10-CM | POA: Diagnosis not present

## 2023-12-12 ENCOUNTER — Encounter: Payer: Self-pay | Admitting: Family Medicine

## 2023-12-12 ENCOUNTER — Ambulatory Visit: Admitting: Family Medicine

## 2023-12-12 VITALS — BP 117/59 | HR 76 | Temp 97.4°F | Ht 63.0 in | Wt 135.2 lb

## 2023-12-12 DIAGNOSIS — E039 Hypothyroidism, unspecified: Secondary | ICD-10-CM | POA: Diagnosis not present

## 2023-12-12 DIAGNOSIS — E7800 Pure hypercholesterolemia, unspecified: Secondary | ICD-10-CM | POA: Diagnosis not present

## 2023-12-12 DIAGNOSIS — K5909 Other constipation: Secondary | ICD-10-CM

## 2023-12-12 DIAGNOSIS — M79674 Pain in right toe(s): Secondary | ICD-10-CM

## 2023-12-12 NOTE — Assessment & Plan Note (Signed)
 Chronic, stable. Currently taking sennosides 25 mg twice daily. Continue bran muffins.

## 2023-12-12 NOTE — Progress Notes (Unsigned)
 Established patient visit   Patient: Laura Love   DOB: 1940-11-26   83 y.o. Female  MRN: [id number] Visit Date: 12/12/2023  Today's healthcare provider: LAURAINE LOISE BUOY, DO   Chief Complaint  Patient presents with  . Medical Management of Chronic Issues    Patient is here for a 1 month follow up.  Wants to discuss bunion on her right foot.   Subjective    HPI Laura Love is an 83 year old female who presents with concerns about a bunion and constipation management.  She has been experiencing ongoing issues with a bunion on her foot, initially suspecting it to be gout. She applies Energesic cream at night to manage the pain. Pain is primarily located in the joint on the inside of her foot, especially when walking or wearing certain shoes, such as flip-flops. She takes ibuprofen, one or two tablets a day, and occasionally Tylenol  to manage the pain. She notes a past incident where she dropped something on her toenail, resulting in a dark spot, but it is not currently causing pain. She is concerned about the appearance but not the pain. Bunions run in her family, with both grandmothers and her mother having had them.  For constipation management, she finds that bran muffins help alleviate her symptoms. She consumes a quarter of a muffin in the morning and another quarter at night. She has experienced difficulty finding these muffins at times but has recently been able to purchase them. Additionally, she takes sennosides, one in the morning and one at night. She lives independently and is actively involved in managing her health care.  She has received both the flu and COVID vaccines, spaced a month apart, and is planning to get the shingles vaccine. She is currently taking Evenivy, with the last dose taken yesterday, and uses an ice pack during administration to help with comfort. She also takes a half tablet of levothyroxine , which she orders from a pharmacy in [place] ,  finding it cost-effective.  She expresses a dislike for wellness visits and cognitive tests, citing past experiences that were upsetting. She attributes some of her anxiety to a difficult relationship with her mother and a lack of paternal presence, which has led to feelings of insecurity. 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   ***  {History (Optional):23778}  Medications: Outpatient Medications Prior to Visit  Medication Sig  . acetaminophen  (TYLENOL ) 325 MG tablet Take 650 mg by mouth every 6 (six) hours as needed for moderate pain.  . albuterol  (VENTOLIN  HFA) 108 (90 Base) MCG/ACT inhaler Inhale 2 puffs into the lungs every 6 (six) hours as needed for wheezing or shortness of breath.  . allopurinol  (ZYLOPRIM ) 100 MG tablet Take 1 tablet (100 mg total) by mouth daily. Start day after colchicine .  . azelastine  (ASTELIN ) 0.1 % nasal spray Place 1 spray into both nostrils 2 (two) times daily as needed for rhinitis. Use in each nostril as directed  . Biotin 1000 MCG tablet Take 5,000 mcg by mouth daily.  . buPROPion  (WELLBUTRIN  SR) 150 MG 12 hr tablet Take 1 tablet (150 mg total) by mouth 2 (two) times daily.  . calcium-vitamin D  (OSCAL WITH D) 500-5 MG-MCG tablet Take 1 tablet by mouth daily.  . colchicine  0.6 MG tablet Take 1.2 mg (two 0.6-mg tablets) orally at the first sign of a flare followed by 0.6 mg (1 tablet)  one hour later; MAX 1.8 mg over 1 hour.  Wait 12 hours and then take 1 tablet every 12 hours until symptoms improve.  . diphenhydrAMINE  (BENADRYL ) 25 MG tablet Take 25 mg by mouth every 6 (six) hours as needed.  . diphenhydramine -acetaminophen  (TYLENOL  PM) 25-500 MG TABS tablet Take 1 tablet by mouth at bedtime as needed.  . ezetimibe  (ZETIA ) 10 MG tablet Take 1 tablet (10 mg total) by mouth daily.  . hyoscyamine  (ANASPAZ ) 0.125 MG TBDP disintergrating tablet Place 0.125  mg under the tongue every 4 (four) hours as needed for cramping.  SABRA ipratropium (ATROVENT ) 0.03 % nasal spray Place 2 sprays into both nostrils every 12 (twelve) hours.  . Melatonin 5 MG CAPS Take 1 capsule by mouth every evening.  . neomycin -polymyxin-hydrocortisone (CORTISPORIN ) OTIC solution Place 3 drops into affected ear three times daily as needed  . Romosozumab -aqqg (EVENITY ) 105 MG/1.17ML SOSY injection Inject 210 mg into the skin once.  . Sennosides 25 MG TABS Take 1 tablet by mouth daily.  . sennosides-docusate sodium  (SENOKOT-S) 8.6-50 MG tablet Take 1 tablet by mouth daily as needed.  . sodium chloride  (OCEAN) 0.65 % SOLN nasal spray Place 1 spray into both nostrils as needed for congestion.  . SYNTHROID  100 MCG tablet TAKE 1 TABLET BY MOUTH DAILY  . Tetrahydroz-Polyvinyl Al-Povid (CLEAR EYES TRIPLE ACTION OP) Apply 1-2 drops to eye 4 (four) times daily as needed.  . traMADol  (ULTRAM ) 50 MG tablet Take 1-2 tablets (50-100 mg total) by mouth every 4 (four) hours as needed for moderate pain.  . vitamin C (ASCORBIC ACID) 500 MG tablet Take 500 mg by mouth daily as needed.  . Vitamin D , Cholecalciferol, 1000 UNITS TABS Take 2 tablets by mouth daily.  . Zinc 30 MG CAPS Take 1 capsule by mouth daily as needed.   No facility-administered medications prior to visit.    {Insert previous labs (optional):23779} {See past labs  Heme  Chem  Endocrine  Serology  Results Review (optional):1}   Objective    BP (!) 117/59 (BP Location: Right Arm, Patient Position: Sitting)   Pulse 76   Temp (!) 97.4 F (36.3 C) (Oral)   Ht 5' 3 (1.6 m)   Wt 135 lb 3.2 oz (61.3 kg)   SpO2 97%   BMI 23.95 kg/m  {Insert last BP/Wt (optional):23777}{See vitals history (optional):1}   Physical Exam Vitals and nursing note reviewed.  Constitutional:      General: She is not in acute distress.    Appearance: Normal appearance.  HENT:     Head: Normocephalic and atraumatic.  Eyes:     General: No  scleral icterus.    Conjunctiva/sclera: Conjunctivae normal.  Cardiovascular:     Rate and Rhythm: Normal rate.  Pulmonary:     Effort: Pulmonary effort is normal.  Musculoskeletal:       Feet:  Feet:     Comments: Tender at base of lateral aspect of 1st digit. Neurological:     Mental Status: She is alert and oriented to person, place, and time. Mental status is at baseline.  Psychiatric:        Mood and Affect: Mood normal.        Behavior: Behavior normal.      No results found for any visits on 12/12/23.  Assessment & Plan    There are no diagnoses linked to this encounter.    Constipation Chronic constipation managed with bran muffins and sennosides. Bran muffins effective but sometimes unavailable. Currently  on maximum dose of sennosides. - Continue bran muffins as tolerated. - Continue sennosides, one in the morning and one at night.  Bunion of foot Chronic bunion pain managed with topical analgesic cream and occasional ibuprofen and acetaminophen . Family predisposition noted. No current need for orthopedic referral unless symptoms worsen. - Continue topical analgesic cream as needed. - Continue ibuprofen and acetaminophen  as needed. - Avoid sandals and wear supportive shoes with room in the front. - Consider orthopedic consultation if symptoms worsen.  Osteoporosis Osteoporosis managed with Evenity  injections. Tolerating treatment well with post-injection ice pack. - Continue Evenity  injections with post-injection ice pack application.  Hypothyroidism Hypothyroidism well-managed with levothyroxine . Medication obtained through cost-effective mail-order service. - Continue current levothyroxine  regimen.  General Health Maintenance Up to date with flu and COVID vaccinations. Planning shingles vaccine pending insurance cost clarification. - Receive shingles vaccine after confirming insurance coverage. ***  No follow-ups on file.      I discussed the  assessment and treatment plan with the patient  The patient was provided an opportunity to ask questions and all were answered. The patient agreed with the plan and demonstrated an understanding of the instructions.   The patient was advised to call back or seek an in-person evaluation if the symptoms worsen or if the condition fails to improve as anticipated.    LAURAINE LOISE BUOY, DO  [facility] [phone] (phone) [phone] (fax)  [facility]

## 2023-12-12 NOTE — Assessment & Plan Note (Signed)
 Noted. Intolerant to statins. Continue ezetimibe  10 mg daily.

## 2023-12-18 DIAGNOSIS — D2271 Melanocytic nevi of right lower limb, including hip: Secondary | ICD-10-CM | POA: Diagnosis not present

## 2023-12-18 DIAGNOSIS — D2262 Melanocytic nevi of left upper limb, including shoulder: Secondary | ICD-10-CM | POA: Diagnosis not present

## 2023-12-18 DIAGNOSIS — D2272 Melanocytic nevi of left lower limb, including hip: Secondary | ICD-10-CM | POA: Diagnosis not present

## 2023-12-18 DIAGNOSIS — D2261 Melanocytic nevi of right upper limb, including shoulder: Secondary | ICD-10-CM | POA: Diagnosis not present

## 2023-12-18 DIAGNOSIS — D225 Melanocytic nevi of trunk: Secondary | ICD-10-CM | POA: Diagnosis not present

## 2023-12-18 DIAGNOSIS — L821 Other seborrheic keratosis: Secondary | ICD-10-CM | POA: Diagnosis not present

## 2024-01-09 ENCOUNTER — Other Ambulatory Visit (HOSPITAL_COMMUNITY): Payer: Self-pay

## 2024-01-09 ENCOUNTER — Ambulatory Visit: Admitting: Family Medicine

## 2024-01-16 DIAGNOSIS — M810 Age-related osteoporosis without current pathological fracture: Secondary | ICD-10-CM | POA: Diagnosis not present

## 2024-01-30 DIAGNOSIS — M1711 Unilateral primary osteoarthritis, right knee: Secondary | ICD-10-CM | POA: Diagnosis not present

## 2024-01-30 DIAGNOSIS — M25561 Pain in right knee: Secondary | ICD-10-CM | POA: Diagnosis not present

## 2024-01-30 DIAGNOSIS — G8929 Other chronic pain: Secondary | ICD-10-CM | POA: Diagnosis not present

## 2024-02-06 DIAGNOSIS — M1711 Unilateral primary osteoarthritis, right knee: Secondary | ICD-10-CM | POA: Diagnosis not present

## 2024-02-06 DIAGNOSIS — M25561 Pain in right knee: Secondary | ICD-10-CM | POA: Diagnosis not present

## 2024-02-06 DIAGNOSIS — G8929 Other chronic pain: Secondary | ICD-10-CM | POA: Diagnosis not present

## 2024-02-12 ENCOUNTER — Ambulatory Visit (INDEPENDENT_AMBULATORY_CARE_PROVIDER_SITE_OTHER): Admitting: Family Medicine

## 2024-02-12 ENCOUNTER — Encounter: Payer: Self-pay | Admitting: Family Medicine

## 2024-02-12 VITALS — BP 118/71 | HR 62 | Temp 98.4°F | Ht 62.0 in | Wt 131.8 lb

## 2024-02-12 DIAGNOSIS — I10 Essential (primary) hypertension: Secondary | ICD-10-CM | POA: Diagnosis not present

## 2024-02-12 DIAGNOSIS — M25561 Pain in right knee: Secondary | ICD-10-CM | POA: Diagnosis not present

## 2024-02-12 DIAGNOSIS — G8929 Other chronic pain: Secondary | ICD-10-CM | POA: Diagnosis not present

## 2024-02-12 NOTE — Patient Instructions (Addendum)
 Recommended vaccine to get at pharmacy: Shingrix  (2nd shingles vaccine).

## 2024-02-12 NOTE — Assessment & Plan Note (Signed)
 Receives gel injection from orthopedics, with some improvement. Scheduled for additional injection tomorrow.

## 2024-02-12 NOTE — Progress Notes (Signed)
 "     Established patient visit   Patient: Laura Love   DOB: 10/07/1940   83 y.o. Female  MRN: [id number] Visit Date: 02/12/2024  Today's healthcare provider: LAURAINE LOISE BUOY, DO   Chief Complaint  Patient presents with   Medical Management of Chronic Issues    Patient is here for a month follow up on chronic management.  Expresses that she doesn't really have any concerns.   Subjective    HPI Last annual exam: 05/14/2023   Laura Love is an 83 year old female who presents for chronic follow-up.  She is due for a Medicare annual wellness visit but declines to have the assessment done today and endorses preferring not to do them in general.  She experiences intermittent flare-ups of foot pain, which she manages with Mobisyl cream. The cream provides relief for her pain.  She has received the first shingles vaccine but is uncertain about the timing of the second dose. She recalls receiving a vaccine at [facility] a month ago but is unsure if it was the shingles vaccine. She has also received the flu and COVID vaccines recently.  She does not take any medication for blood pressure and has never been on such medication. Her blood pressure only elevates when she is upset, which she manages by avoiding stressors like watching the news.  She takes Synthroid  for hypothyroidism, which she orders from [place] , as generic levothyroxine  has historically been ineffective for her. Most of her other medications are taken as needed.  She is currently receiving gel injections in her right knee, having completed two injections and scheduled for a third. She has been receiving these injections every six months for several years.  She is undergoing treatment with Evenity  for osteoporosis, which she notes has been costly. She has been receiving monthly injections since July, with the next one scheduled for December 30th.  She uses nasal saline solution to rinse her nose, especially after being  in public places, to prevent respiratory infections.      Medications: Show/hide medication list[1]  Review of Systems  Constitutional:  Negative for activity change, appetite change and fatigue.  Respiratory:  Negative for shortness of breath.   Cardiovascular:  Negative for chest pain.        Objective    BP 118/71 (BP Location: Right Arm, Patient Position: Sitting, Cuff Size: Normal)   Pulse 62   Temp 98.4 F (36.9 C) (Oral)   Ht 5' 2 (1.575 m)   Wt 131 lb 12.8 oz (59.8 kg)   SpO2 97%   BMI 24.11 kg/m     Physical Exam Constitutional:      Appearance: Normal appearance.  HENT:     Head: Normocephalic and atraumatic.  Eyes:     General: No scleral icterus.    Extraocular Movements: Extraocular movements intact.     Conjunctiva/sclera: Conjunctivae normal.  Cardiovascular:     Rate and Rhythm: Normal rate and regular rhythm.     Pulses: Normal pulses.     Heart sounds: Normal heart sounds.  Pulmonary:     Effort: Pulmonary effort is normal. No respiratory distress.     Breath sounds: Normal breath sounds.  Musculoskeletal:     Right lower leg: No edema.     Left lower leg: No edema.  Skin:    General: Skin is warm and dry.  Neurological:     Mental Status: She is alert and oriented to person, place, and time.  Mental status is at baseline.  Psychiatric:        Mood and Affect: Mood normal.        Behavior: Behavior normal.      No results found for any visits on 02/12/24.  Assessment & Plan    Primary hypertension Assessment & Plan: Stable, controlled. Hypertension managed through lifestyle modifications, including stress avoidance and caffeine limitation. Previously on amlodipine , currently not on antihypertensive medication.  - continue lifestyle modifications   Chronic pain of right knee Assessment & Plan: Receives gel injection from orthopedics, with some improvement. Scheduled for additional injection tomorrow.       Osteoporosis  with high fracture risk Osteoporosis with 8.8% hip fracture risk (FRAX score), on Evenity  despite insurance cost issues. - Continue Evenity  injections. - Discuss insurance coverage and cost savings with insurance provider and endocrinologist.  Hypothyroidism Managed with Synthroid , levothyroxine  ineffective. - Continue Synthroid .  General Health Maintenance Annual wellness visit declined, flu and COVID vaccines received, confusion about second shingles vaccine. - Schedule second shingles vaccine for next year, per patient preference.    Return in 3 months (on 05/26/2024) for CPE, and in 4 months for [facility] w/Dr. Franchot or Dr. Bernardo (based on patient preference).      I discussed the assessment and treatment plan with the patient  The patient was provided an opportunity to ask questions and all were answered. The patient agreed with the plan and demonstrated an understanding of the instructions.   The patient was advised to call back or seek an in-person evaluation if the symptoms worsen or if the condition fails to improve as anticipated.    LAURAINE LOISE BUOY, DO  [place] [facility] [phone] (phone) [phone] (fax)  [facility]     [1]  Outpatient Medications Prior to Visit  Medication Sig   acetaminophen  (TYLENOL ) 325 MG tablet Take 650 mg by mouth every 6 (six) hours as needed for moderate pain.   albuterol  (VENTOLIN  HFA) 108 (90 Base) MCG/ACT inhaler Inhale 2 puffs into the lungs every 6 (six) hours as needed for wheezing or shortness of breath.   allopurinol  (ZYLOPRIM ) 100 MG tablet Take 1 tablet (100 mg total) by mouth daily. Start day after colchicine .   azelastine  (ASTELIN ) 0.1 % nasal spray Place 1 spray into both nostrils 2 (two) times daily as needed for rhinitis. Use in each nostril as directed   Biotin 1000 MCG tablet Take 5,000 mcg by mouth daily.   buPROPion  (WELLBUTRIN  SR) 150 MG 12 hr tablet Take 1 tablet (150 mg total) by  mouth 2 (two) times daily.   calcium-vitamin D  (OSCAL WITH D) 500-5 MG-MCG tablet Take 1 tablet by mouth daily.   colchicine  0.6 MG tablet Take 1.2 mg (two 0.6-mg tablets) orally at the first sign of a flare followed by 0.6 mg (1 tablet) one hour later; MAX 1.8 mg over 1 hour.  Wait 12 hours and then take 1 tablet every 12 hours until symptoms improve.   diphenhydrAMINE  (BENADRYL ) 25 MG tablet Take 25 mg by mouth every 6 (six) hours as needed.   diphenhydramine -acetaminophen  (TYLENOL  PM) 25-500 MG TABS tablet Take 1 tablet by mouth at bedtime as needed.   ezetimibe  (ZETIA ) 10 MG tablet Take 1 tablet (10 mg total) by mouth daily.   hyoscyamine  (ANASPAZ ) 0.125 MG TBDP disintergrating tablet Place 0.125 mg under the tongue every 4 (four) hours as needed for cramping.   ipratropium (ATROVENT ) 0.03 % nasal spray Place 2 sprays into  both nostrils every 12 (twelve) hours.   Melatonin 5 MG CAPS Take 1 capsule by mouth every evening.   neomycin -polymyxin-hydrocortisone (CORTISPORIN ) OTIC solution Place 3 drops into affected ear three times daily as needed   Romosozumab -aqqg (EVENITY ) 105 MG/1.17ML SOSY injection Inject 210 mg into the skin once.   Sennosides 25 MG TABS Take 1 tablet by mouth daily.   sennosides-docusate sodium  (SENOKOT-S) 8.6-50 MG tablet Take 1 tablet by mouth daily as needed.   sodium chloride  (OCEAN) 0.65 % SOLN nasal spray Place 1 spray into both nostrils as needed for congestion.   SYNTHROID  100 MCG tablet TAKE 1 TABLET BY MOUTH DAILY   Tetrahydroz-Polyvinyl Al-Povid (CLEAR EYES TRIPLE ACTION OP) Apply 1-2 drops to eye 4 (four) times daily as needed.   traMADol  (ULTRAM ) 50 MG tablet Take 1-2 tablets (50-100 mg total) by mouth every 4 (four) hours as needed for moderate pain.   vitamin C (ASCORBIC ACID) 500 MG tablet Take 500 mg by mouth daily as needed.   Vitamin D , Cholecalciferol, 1000 UNITS TABS Take 2 tablets by mouth daily.   Zinc 30 MG CAPS Take 1 capsule by mouth daily as  needed.   No facility-administered medications prior to visit.   "

## 2024-02-12 NOTE — Assessment & Plan Note (Addendum)
 Stable, controlled. Hypertension managed through lifestyle modifications, including stress avoidance and caffeine limitation. Previously on amlodipine , currently not on antihypertensive medication.  - continue lifestyle modifications

## 2024-05-09 ENCOUNTER — Ambulatory Visit: Admitting: Internal Medicine

## 2024-05-12 ENCOUNTER — Encounter: Admitting: Internal Medicine

## 2024-05-16 ENCOUNTER — Encounter: Admitting: Family Medicine

## 2024-06-12 ENCOUNTER — Ambulatory Visit: Admitting: Internal Medicine
